# Patient Record
Sex: Female | Born: 1935 | Race: White | Hispanic: No | Smoking: Never smoker
Health system: Southern US, Community
[De-identification: ages and names within clinical notes are randomized; demographics above are authoritative.]

## PROBLEM LIST (undated history)

## (undated) DIAGNOSIS — Z923 Personal history of irradiation: Secondary | ICD-10-CM

## (undated) DIAGNOSIS — F329 Major depressive disorder, single episode, unspecified: Secondary | ICD-10-CM

## (undated) DIAGNOSIS — G4700 Insomnia, unspecified: Secondary | ICD-10-CM

## (undated) DIAGNOSIS — C55 Malignant neoplasm of uterus, part unspecified: Secondary | ICD-10-CM

## (undated) DIAGNOSIS — I10 Essential (primary) hypertension: Secondary | ICD-10-CM

## (undated) DIAGNOSIS — F32A Depression, unspecified: Secondary | ICD-10-CM

## (undated) DIAGNOSIS — I5032 Chronic diastolic (congestive) heart failure: Secondary | ICD-10-CM

## (undated) DIAGNOSIS — K2270 Barrett's esophagus without dysplasia: Secondary | ICD-10-CM

## (undated) DIAGNOSIS — M159 Polyosteoarthritis, unspecified: Secondary | ICD-10-CM

## (undated) DIAGNOSIS — E039 Hypothyroidism, unspecified: Secondary | ICD-10-CM

## (undated) DIAGNOSIS — C3490 Malignant neoplasm of unspecified part of unspecified bronchus or lung: Secondary | ICD-10-CM

## (undated) DIAGNOSIS — I4821 Permanent atrial fibrillation: Secondary | ICD-10-CM

## (undated) DIAGNOSIS — E785 Hyperlipidemia, unspecified: Secondary | ICD-10-CM

## (undated) HISTORY — DX: Permanent atrial fibrillation: I48.21

## (undated) HISTORY — DX: Chronic diastolic (congestive) heart failure: I50.32

## (undated) HISTORY — PX: APPENDECTOMY: SHX54

## (undated) HISTORY — PX: OTHER SURGICAL HISTORY: SHX169

## (undated) HISTORY — PX: BREAST BIOPSY: SHX20

---

## 1979-11-27 DIAGNOSIS — C55 Malignant neoplasm of uterus, part unspecified: Secondary | ICD-10-CM

## 1979-11-27 HISTORY — DX: Malignant neoplasm of uterus, part unspecified: C55

## 1982-11-26 HISTORY — PX: ABDOMINAL HYSTERECTOMY: SHX81

## 1998-11-26 HISTORY — PX: KNEE ARTHROSCOPY: SUR90

## 2004-11-15 ENCOUNTER — Ambulatory Visit: Payer: Self-pay

## 2005-07-10 ENCOUNTER — Ambulatory Visit: Payer: Self-pay | Admitting: Internal Medicine

## 2006-08-01 ENCOUNTER — Ambulatory Visit: Payer: Self-pay | Admitting: Internal Medicine

## 2007-11-13 ENCOUNTER — Ambulatory Visit: Payer: Self-pay | Admitting: Internal Medicine

## 2007-12-03 ENCOUNTER — Ambulatory Visit: Payer: Self-pay | Admitting: Internal Medicine

## 2008-02-17 ENCOUNTER — Ambulatory Visit: Payer: Self-pay | Admitting: Unknown Physician Specialty

## 2008-09-01 ENCOUNTER — Ambulatory Visit: Payer: Self-pay | Admitting: Ophthalmology

## 2008-09-01 ENCOUNTER — Other Ambulatory Visit: Payer: Self-pay

## 2008-09-14 ENCOUNTER — Ambulatory Visit: Payer: Self-pay | Admitting: Ophthalmology

## 2008-10-26 ENCOUNTER — Ambulatory Visit: Payer: Self-pay | Admitting: Ophthalmology

## 2008-11-09 ENCOUNTER — Ambulatory Visit: Payer: Self-pay | Admitting: Ophthalmology

## 2008-12-07 ENCOUNTER — Ambulatory Visit: Payer: Self-pay | Admitting: Internal Medicine

## 2009-06-21 ENCOUNTER — Ambulatory Visit: Payer: Self-pay | Admitting: Unknown Physician Specialty

## 2009-12-22 ENCOUNTER — Ambulatory Visit: Payer: Self-pay | Admitting: Internal Medicine

## 2010-08-24 ENCOUNTER — Ambulatory Visit: Payer: Self-pay | Admitting: Internal Medicine

## 2010-09-07 ENCOUNTER — Ambulatory Visit: Payer: Self-pay | Admitting: Unknown Physician Specialty

## 2010-09-14 ENCOUNTER — Ambulatory Visit: Payer: Self-pay | Admitting: Unknown Physician Specialty

## 2011-03-27 ENCOUNTER — Ambulatory Visit: Payer: Self-pay | Admitting: Internal Medicine

## 2012-05-20 ENCOUNTER — Ambulatory Visit: Payer: Self-pay | Admitting: Internal Medicine

## 2013-09-15 ENCOUNTER — Ambulatory Visit: Payer: Self-pay | Admitting: Internal Medicine

## 2014-07-09 ENCOUNTER — Ambulatory Visit: Payer: Self-pay | Admitting: Unknown Physician Specialty

## 2014-07-14 LAB — PATHOLOGY REPORT

## 2014-09-28 ENCOUNTER — Ambulatory Visit: Payer: Self-pay | Admitting: Internal Medicine

## 2015-05-02 DIAGNOSIS — E039 Hypothyroidism, unspecified: Secondary | ICD-10-CM | POA: Insufficient documentation

## 2015-05-02 DIAGNOSIS — G4700 Insomnia, unspecified: Secondary | ICD-10-CM | POA: Insufficient documentation

## 2015-05-02 DIAGNOSIS — I10 Essential (primary) hypertension: Secondary | ICD-10-CM | POA: Insufficient documentation

## 2015-05-02 DIAGNOSIS — M1990 Unspecified osteoarthritis, unspecified site: Secondary | ICD-10-CM | POA: Insufficient documentation

## 2015-05-02 DIAGNOSIS — M810 Age-related osteoporosis without current pathological fracture: Secondary | ICD-10-CM | POA: Insufficient documentation

## 2015-11-27 DIAGNOSIS — C3490 Malignant neoplasm of unspecified part of unspecified bronchus or lung: Secondary | ICD-10-CM

## 2015-11-27 DIAGNOSIS — Z923 Personal history of irradiation: Secondary | ICD-10-CM

## 2015-11-27 HISTORY — DX: Malignant neoplasm of unspecified part of unspecified bronchus or lung: C34.90

## 2015-11-27 HISTORY — DX: Personal history of irradiation: Z92.3

## 2015-12-01 ENCOUNTER — Inpatient Hospital Stay
Admission: EM | Admit: 2015-12-01 | Discharge: 2015-12-03 | DRG: 152 | Disposition: A | Discharge status: Home / Self Care | Payer: Medicare Other | Attending: Internal Medicine | Admitting: Internal Medicine

## 2015-12-01 ENCOUNTER — Emergency Department: Payer: Medicare Other

## 2015-12-01 ENCOUNTER — Encounter: Payer: Self-pay | Admitting: Internal Medicine

## 2015-12-01 DIAGNOSIS — R55 Syncope and collapse: Secondary | ICD-10-CM | POA: Diagnosis not present

## 2015-12-01 DIAGNOSIS — I739 Peripheral vascular disease, unspecified: Secondary | ICD-10-CM | POA: Diagnosis present

## 2015-12-01 DIAGNOSIS — I10 Essential (primary) hypertension: Secondary | ICD-10-CM | POA: Diagnosis present

## 2015-12-01 DIAGNOSIS — E039 Hypothyroidism, unspecified: Secondary | ICD-10-CM | POA: Diagnosis present

## 2015-12-01 DIAGNOSIS — J040 Acute laryngitis: Secondary | ICD-10-CM | POA: Diagnosis not present

## 2015-12-01 DIAGNOSIS — F329 Major depressive disorder, single episode, unspecified: Secondary | ICD-10-CM | POA: Diagnosis present

## 2015-12-01 DIAGNOSIS — Z7901 Long term (current) use of anticoagulants: Secondary | ICD-10-CM

## 2015-12-01 DIAGNOSIS — J40 Bronchitis, not specified as acute or chronic: Secondary | ICD-10-CM | POA: Diagnosis present

## 2015-12-01 DIAGNOSIS — J189 Pneumonia, unspecified organism: Secondary | ICD-10-CM | POA: Diagnosis present

## 2015-12-01 DIAGNOSIS — R4182 Altered mental status, unspecified: Secondary | ICD-10-CM

## 2015-12-01 DIAGNOSIS — E785 Hyperlipidemia, unspecified: Secondary | ICD-10-CM | POA: Diagnosis present

## 2015-12-01 DIAGNOSIS — E162 Hypoglycemia, unspecified: Secondary | ICD-10-CM | POA: Diagnosis present

## 2015-12-01 DIAGNOSIS — N179 Acute kidney failure, unspecified: Secondary | ICD-10-CM

## 2015-12-01 HISTORY — DX: Essential (primary) hypertension: I10

## 2015-12-01 HISTORY — DX: Hypothyroidism, unspecified: E03.9

## 2015-12-01 HISTORY — DX: Polyosteoarthritis, unspecified: M15.9

## 2015-12-01 HISTORY — DX: Insomnia, unspecified: G47.00

## 2015-12-01 HISTORY — DX: Barrett's esophagus without dysplasia: K22.70

## 2015-12-01 HISTORY — DX: Depression, unspecified: F32.A

## 2015-12-01 HISTORY — DX: Hyperlipidemia, unspecified: E78.5

## 2015-12-01 HISTORY — DX: Major depressive disorder, single episode, unspecified: F32.9

## 2015-12-01 LAB — COMPREHENSIVE METABOLIC PANEL
ALBUMIN: 4.2 g/dL (ref 3.5–5.0)
ALT: 13 U/L — ABNORMAL LOW (ref 14–54)
ANION GAP: 8 (ref 5–15)
AST: 19 U/L (ref 15–41)
Alkaline Phosphatase: 52 U/L (ref 38–126)
BUN: 21 mg/dL — AB (ref 6–20)
CHLORIDE: 102 mmol/L (ref 101–111)
CO2: 27 mmol/L (ref 22–32)
Calcium: 9.3 mg/dL (ref 8.9–10.3)
Creatinine, Ser: 1.11 mg/dL — ABNORMAL HIGH (ref 0.44–1.00)
GFR calc Af Amer: 53 mL/min — ABNORMAL LOW (ref >60)
GFR calc non Af Amer: 45 mL/min — ABNORMAL LOW (ref >60)
GLUCOSE: 129 mg/dL — AB (ref 65–99)
POTASSIUM: 3.9 mmol/L (ref 3.5–5.1)
SODIUM: 137 mmol/L (ref 135–145)
Total Bilirubin: 0.5 mg/dL (ref 0.3–1.2)
Total Protein: 7.9 g/dL (ref 6.5–8.1)

## 2015-12-01 LAB — CBC WITH DIFFERENTIAL/PLATELET
BASOS PCT: 1 %
Basophils Absolute: 0.1 10*3/uL (ref 0–0.1)
EOS PCT: 1 %
Eosinophils Absolute: 0.1 10*3/uL (ref 0–0.7)
HEMATOCRIT: 43.1 % (ref 35.0–47.0)
Hemoglobin: 14.3 g/dL (ref 12.0–16.0)
Lymphocytes Relative: 12 %
Lymphs Abs: 1.7 10*3/uL (ref 1.0–3.6)
MCH: 28.7 pg (ref 26.0–34.0)
MCHC: 33.2 g/dL (ref 32.0–36.0)
MCV: 86.6 fL (ref 80.0–100.0)
MONO ABS: 1.1 10*3/uL — AB (ref 0.2–0.9)
MONOS PCT: 7 %
NEUTROS ABS: 12.1 10*3/uL — AB (ref 1.4–6.5)
Neutrophils Relative %: 79 %
PLATELETS: 211 10*3/uL (ref 150–440)
RBC: 4.97 MIL/uL (ref 3.80–5.20)
RDW: 13.9 % (ref 11.5–14.5)
WBC: 15.1 10*3/uL — ABNORMAL HIGH (ref 3.6–11.0)

## 2015-12-01 LAB — URINALYSIS COMPLETE WITH MICROSCOPIC (ARMC ONLY)
BILIRUBIN URINE: NEGATIVE
GLUCOSE, UA: NEGATIVE mg/dL
KETONES UR: NEGATIVE mg/dL
Leukocytes, UA: NEGATIVE
NITRITE: NEGATIVE
Protein, ur: NEGATIVE mg/dL
Specific Gravity, Urine: 1.008 (ref 1.005–1.030)
pH: 6 (ref 5.0–8.0)

## 2015-12-01 LAB — ETHANOL: Alcohol, Ethyl (B): <5 mg/dL (ref <5)

## 2015-12-01 LAB — TSH: TSH: 2.472 u[IU]/mL (ref 0.350–4.500)

## 2015-12-01 LAB — LACTIC ACID, PLASMA
LACTIC ACID, VENOUS: 1.4 mmol/L (ref 0.5–2.0)
LACTIC ACID, VENOUS: 0.8 mmol/L (ref 0.5–2.0)

## 2015-12-01 LAB — TROPONIN I: Troponin I: <0.03 ng/mL (ref <0.031)

## 2015-12-01 MED ORDER — DEXTROSE 5 % IV SOLN
1.0000 g | Freq: Once | INTRAVENOUS | Status: AC
  Administered 2015-12-01: 1 g via INTRAVENOUS
  Filled 2015-12-01: qty 10

## 2015-12-01 MED ORDER — DEXTROSE 5 % IV SOLN
500.0000 mg | Freq: Once | INTRAVENOUS | Status: AC
  Administered 2015-12-01: 500 mg via INTRAVENOUS
  Filled 2015-12-01: qty 500

## 2015-12-01 MED ORDER — AZITHROMYCIN 500 MG IV SOLR
500.0000 mg | INTRAVENOUS | Status: DC
  Filled 2015-12-01: qty 500

## 2015-12-01 MED ORDER — DEXTROSE 5 % IV SOLN
1.0000 g | INTRAVENOUS | Status: DC
  Administered 2015-12-02: 1 g via INTRAVENOUS
  Filled 2015-12-01 (×2): qty 10

## 2015-12-01 MED ORDER — SODIUM CHLORIDE 0.9 % IV BOLUS (SEPSIS)
1000.0000 mL | Freq: Once | INTRAVENOUS | Status: AC
  Administered 2015-12-01: 1000 mL via INTRAVENOUS

## 2015-12-01 MED ORDER — ACETAMINOPHEN 160 MG/5ML PO SUSP
ORAL | Status: AC
  Filled 2015-12-01: qty 5

## 2015-12-01 MED ORDER — ONDANSETRON HCL 4 MG/2ML IJ SOLN: 4.0000 mg | Freq: Four times a day (QID) | INTRAMUSCULAR | Status: DC | PRN

## 2015-12-01 MED ORDER — HEPARIN SODIUM (PORCINE) 5000 UNIT/ML IJ SOLN
5000.0000 [IU] | Freq: Three times a day (TID) | INTRAMUSCULAR | Status: DC
  Administered 2015-12-01 – 2015-12-02 (×2): 5000 [IU] via SUBCUTANEOUS
  Filled 2015-12-01 (×3): qty 1

## 2015-12-01 MED ORDER — ONDANSETRON HCL 4 MG PO TABS: 4.0000 mg | ORAL_TABLET | Freq: Four times a day (QID) | ORAL | Status: DC | PRN

## 2015-12-01 MED ORDER — GUAIFENESIN ER 600 MG PO TB12
600.0000 mg | ORAL_TABLET | Freq: Two times a day (BID) | ORAL | Status: DC
  Administered 2015-12-01 – 2015-12-03 (×4): 600 mg via ORAL
  Filled 2015-12-01 (×4): qty 1

## 2015-12-01 MED ORDER — ACETAMINOPHEN 325 MG PO TABS
650.0000 mg | ORAL_TABLET | Freq: Four times a day (QID) | ORAL | Status: DC | PRN
  Administered 2015-12-01 – 2015-12-02 (×3): 650 mg via ORAL
  Filled 2015-12-01 (×3): qty 2

## 2015-12-01 MED ORDER — SODIUM CHLORIDE 0.9 % IV SOLN
INTRAVENOUS | Status: DC
  Administered 2015-12-01 – 2015-12-03 (×4): via INTRAVENOUS

## 2015-12-01 MED ORDER — ACETAMINOPHEN 650 MG RE SUPP: 650.0000 mg | Freq: Four times a day (QID) | RECTAL | Status: DC | PRN

## 2015-12-01 NOTE — ED Notes (Addendum)
Pt was visiting on the inpatient unit , a rapid response team was paged to 240, pt  was noted to be in and out of consciousness, pt appearing lethargic, but arouses with verbal stimuli., pt was able to stand wit hx2 assist from the chair to stretcher. Even unlabored respirations noted , staff on 2A reported that Ambien was noted to be in her purse.  CBG 124, vitals BP 133/63, sat 98% , HR 100 , RR 18

## 2015-12-01 NOTE — ED Provider Notes (Signed)
[facility] Emergency Department Provider Note   ____________________________________________  Time seen: Approximately 11:45 AM I have reviewed the triage vital signs and the triage nursing note.  HISTORY  Chief Complaint Altered Mental Status   Historian Limited historian, Patient has altered mental status Niece provided some history  HPI Lauren Hammond is a 82 y.o. female who is the spouse of an admitted patient in the hospital. Nursing staff on his floor report they saw her acting normally at about 45 minutes ago. They came back into the room and found her sitting in the recliner chair slumped over to the side and arousable but very somnolent. A rapid response was called and patient was taught to the emergency room for further evaluation. Niece states she walked in basically when the nurses did and found her and somnolent but arousable. She has not seen her act this way before.  Symptoms are moderate to severe.  There is no report of trauma today. The patient does have bruising on her chin from a fall a week ago.    No past medical history on file.  There are no active problems to display for this patient.   No past surgical history on file.  No current outpatient prescriptions on file.  Allergies Review of patient's allergies indicates not on file.  No family history on file.  Social History Social History  Substance Use Topics  . Smoking status: Not on file  . Smokeless tobacco: Not on file  . Alcohol Use: Not on file    Review of Systems Unable to obtain due to altered mental status Although patient states that she is in no pain, and that she's had croup recently.  ____________________________________________   PHYSICAL EXAM:  VITAL SIGNS: ED Triage Vitals  Enc Vitals Group     BP 12/01/15 1154 133/69 mmHg     Pulse Rate 12/01/15 1154 93     Resp 12/01/15 1154 18     Temp 12/01/15 1154 98.2 F (36.8 C)     Temp Source  12/01/15 1154 Oral     SpO2 12/01/15 1154 96 %     Weight 12/01/15 1154 158 lb (71.668 kg)     Height 12/01/15 1154 '5\' 3"'$  (1.6 m)     Head Cir --      Peak Flow --      Pain Score --      Pain Loc --      Pain Edu? --      Excl. in [place]? --      Constitutional: Sleepy/somnolent but alert to voice and then eyes go right back asleep.  Eyes: Conjunctivae are normal. PERRL. Normal extraocular movements. ENT   Head: Normocephalic.  Old ecchymosis to the chin   Nose: No congestion/rhinnorhea.   Mouth/Throat: Mucous membranes are moist. Hoarse/raspy voice.   Neck: No stridor. Cardiovascular/Chest: Normal rate, regular rhythm.  No murmurs, rubs, or gallops. Respiratory: Normal respiratory effort without tachypnea nor retractions. Breath sounds are clear and equal bilaterally. No wheezes/rales/rhonchi. Gastrointestinal: Soft. No distention, no guarding, no rebound. Nontender.  Genitourinary/rectal:Deferred Musculoskeletal: Nontender with normal range of motion in all extremities. No joint effusions.  No lower extremity tenderness.  No edema. Neurologic: Somnolent but arousable to voice. No slurred speech. Patient able to follow commands. 5 out of 5 strength in 4 extremities. No facial droop. Patient does not answer questions about sensation, but appears to have sensation in 4 extremities. Skin:  Skin is warm, dry and intact. No  rash noted.   ____________________________________________   EKG I, Lisa Roca, MD, the attending physician have personally viewed and interpreted all ECGs.  93 bpm. Sinus rhythm with first-degree AV block. Left axis deviation. Normal ST and T-wave. ____________________________________________  LABS (pertinent positives/negatives)  Comprehensive metabolic panel significant for BUN 21 and creatinine 1.11 Alcohol less than 5 Troponin less than 0.03 White blood count 15.1, hemoglobin is 14.3 and platelet count 211 Lactate 1.4 Blood culture  sent  ____________________________________________  RADIOLOGY All Xrays were viewed by me. Imaging interpreted by Radiologist.  Chest 1 view: Mild bibasilar atelectasis/infiltrate.  CT noncontrast:  IMPRESSION: Prominent ventricles with normal appearing sulci. While this finding may be indicative of atrophy, it is concerning for possibly superimposed normal pressure hydrocephalus.  There is mild periventricular small vessel disease. No intracranial mass, hemorrhage, or acute appearing infarct.  Mastoid air cell disease bilaterally, more on the left than on the right. __________________________________________  PROCEDURES  Procedure(s) performed: None  Critical Care performed: None  ____________________________________________   ED COURSE / ASSESSMENT AND PLAN  CONSULTATIONS: Hospitalist for admission, spoke with Dr. Carlyle Dolly  Pertinent labs & imaging results that were available during my care of the patient were reviewed by me and considered in my medical decision making (see chart for details).   Family patient has had a fairly abrupt change in mental status.  She doesn't have any focal neurologic deficits on exam, however given the abrupt change in mental status, I did send her for an immediate head CT evaluation. She is not a fever, or abnormal vital signs, and I'm not suspicious of sepsis at this point. Ambien was found in her pocketbook, and patient denied taking it to her niece except for last night. To me she says she did take one, but her history is extremely unreliable this point.    I updated the daughter with the workup thus far. Head CT negative for acute abnormalities, but we discussed likely need for MRI and possible neurology evaluation at some point for findings possibly consistent with normal pressure hydrocephalus.  From acute standpoint, white blood cell count is elevated, uncertain etiology, but radiologist commented on mild bibasilar atelectasis  versus infiltrate. Given that she has had some upper respiratory symptoms, I will treat her with Rocephin and azithromycin.Marland Kitchen Urinalysis is still pending.  As patient is not her mental status baseline, she will be admitted observation to the hospitalist team. Urinalysis pending.  Patient / Family / Caregiver informed of clinical course, medical decision-making process, and agree with plan.   ___________________________________________   FINAL CLINICAL IMPRESSION(S) / ED DIAGNOSES   Final diagnoses:  Altered mental status, unspecified altered mental status type       Lisa Roca, MD 12/01/15 [phone]

## 2015-12-01 NOTE — Progress Notes (Signed)
   12/01/15 1200  Clinical Encounter Type  Visited With Patient and family together  Visit Type Follow-up  Referral From Nurse  Consult/Referral To Chaplain  I came to ED to check on patient who had gone unresponsive in husband's room while visiting. Patient is asleep. I spent time with patient's niece.  [place]

## 2015-12-01 NOTE — ED Notes (Signed)
Pt given sandwich tray 

## 2015-12-01 NOTE — H&P (Signed)
[facility] NAME: Lauren Hammond    MR#:  [id number]  DATE OF BIRTH:  Apr 30, 1932  DATE OF ADMISSION:  12/01/2015  PRIMARY CARE PHYSICIAN: Jacklynn Ganong MD REQUESTING/REFERRING PHYSICIAN: Lisa Roca MD  CHIEF COMPLAINT:   Chief Complaint  Patient presents with  . Altered Mental Status    HISTORY OF PRESENT ILLNESS: Lauren Hammond  is a 82 y.o. female with a known history of hypertension, depression and hypothyroidism who is brought to the hospital after she was visiting her husband in the hospital. While in the room patient became lethargic and collapse. She has been intermittently confused but now mental status improved. Patient had evaluation in the ER and noticed to have a WBC count was elevated chest x-ray suggestive of possible infiltrate. She also has been very weak. She did fall 2 weeks ago. Patient also has had a him laryngitis for the past few days. She denies any chest pain or shortness of breath no nausea vomiting or diarrhea. Patient had a CT scan of the head which  showed no CVA.        PAST MEDICAL HISTORY:   Past Medical History  Diagnosis Date  . HTN (hypertension)   . Depression   . Hypothyroid   . Insomnia   . Hyperlipemia   . Generalized OA   . Barrett esophagus     PAST SURGICAL HISTORY: Past Surgical History  Procedure Laterality Date  . Right breast bx    . Appendectomy    . Hysterotomy      SOCIAL HISTORY:  Social History  Substance Use Topics  . Smoking status: Not on file  . Smokeless tobacco: Not on file  . Alcohol Use: No    FAMILY HISTORY:  Family History  Problem Relation Age of Onset  . Hypertension      DRUG ALLERGIES:  Allergies  Allergen Reactions  . Other     Reaction: patient was unable to communicate and family member was unable to tell me any allergies     REVIEW OF SYSTEMS:   CONSTITUTIONAL: No fever, positive fatigue and weakness.  EYES: No blurred or  double vision.  EARS, NOSE, AND THROAT: No tinnitus or ear pain. Positive sore throat RESPIRATORY: Positive cough, positive shortness of breath, positive wheezing no hemoptysis.  CARDIOVASCULAR: No chest pain, orthopnea, edema.  GASTROINTESTINAL: No nausea, vomiting, diarrhea or abdominal pain.  GENITOURINARY: No dysuria, hematuria.  ENDOCRINE: No polyuria, nocturia,  HEMATOLOGY: No anemia, easy bruising or bleeding SKIN: No rash or lesion. MUSCULOSKELETAL: No joint pain or arthritis.   NEUROLOGIC: No tingling, numbness, weakness.  PSYCHIATRY: No anxiety or depression.   MEDICATIONS AT HOME:  Prior to Admission medications   Medication Sig Start Date End Date Taking? Authorizing Provider  amLODipine (NORVASC) 10 MG tablet Take 10 mg by mouth daily.   Yes Historical Provider, MD  lovastatin (MEVACOR) 20 MG tablet Take 20 mg by mouth daily.   Yes Historical Provider, MD  sertraline (ZOLOFT) 100 MG tablet Take 100 mg by mouth daily.   Yes Historical Provider, MD  SYNTHROID 88 MCG tablet Take 88 mcg by mouth daily before breakfast.   Yes Historical Provider, MD      PHYSICAL EXAMINATION:   VITAL SIGNS: Blood pressure 134/69, pulse 81, temperature 98.2 F (36.8 C), temperature source Oral, resp. rate 18, height '5\' 3"'$  (1.6 m), weight 71.668 kg (158 lb), SpO2 96 %.  GENERAL:  82 y.o.-year-old patient lying  in the bed appears very weak EYES: Pupils equal, round, reactive to light and accommodation. No scleral icterus. Extraocular muscles intact.  HEENT: Head atraumatic, normocephalic. Oropharynx with erythema.  NECK:  Supple, no jugular venous distention. No thyroid enlargement, no tenderness.  LUNGS: Normal breath sounds bilaterally, no wheezing, rales,rhonchi or crepitation. No use of accessory muscles of respiration.  CARDIOVASCULAR: S1, S2 normal. No murmurs, rubs, or gallops.  ABDOMEN: Soft, nontender, nondistended. Bowel sounds present. No organomegaly or mass.  EXTREMITIES: No pedal  edema, cyanosis, or clubbing.  NEUROLOGIC: Cranial nerves II through XII are intact. Muscle strength 5/5 in all extremities. Sensation intact. Gait not checked.  PSYCHIATRIC: The patient is alert and oriented x 3.  SKIN: Has bruising in her chin area  LABORATORY PANEL:   CBC  Recent Labs Lab 12/01/15 1149  WBC 15.1*  HGB 14.3  HCT 43.1  PLT 211  MCV 86.6  MCH 28.7  MCHC 33.2  RDW 13.9  LYMPHSABS 1.7  MONOABS 1.1*  EOSABS 0.1  BASOSABS 0.1   ------------------------------------------------------------------------------------------------------------------  Chemistries   Recent Labs Lab 12/01/15 1149  NA 137  K 3.9  CL 102  CO2 27  GLUCOSE 129*  BUN 21*  CREATININE 1.11*  CALCIUM 9.3  AST 19  ALT 13*  ALKPHOS 52  BILITOT 0.5   ------------------------------------------------------------------------------------------------------------------ estimated creatinine clearance is 37.7 mL/min (by C-G formula based on Cr of 1.11). ------------------------------------------------------------------------------------------------------------------ No results for input(s): TSH, T4TOTAL, T3FREE, THYROIDAB in the last 72 hours.  Invalid input(s): FREET3   Coagulation profile No results for input(s): INR, PROTIME in the last 168 hours. ------------------------------------------------------------------------------------------------------------------- No results for input(s): DDIMER in the last 72 hours. -------------------------------------------------------------------------------------------------------------------  Cardiac Enzymes  Recent Labs Lab 12/01/15 1149  TROPONINI <0.03   ------------------------------------------------------------------------------------------------------------------ Invalid input(s): POCBNP  ---------------------------------------------------------------------------------------------------------------  Urinalysis    Component Value  Date/Time   COLORURINE YELLOW* 12/01/2015 1508   APPEARANCEUR CLEAR* 12/01/2015 1508   LABSPEC 1.008 12/01/2015 1508   PHURINE 6.0 12/01/2015 1508   GLUCOSEU NEGATIVE 12/01/2015 1508   HGBUR 1+* 12/01/2015 1508   BILIRUBINUR NEGATIVE 12/01/2015 1508   KETONESUR NEGATIVE 12/01/2015 1508   PROTEINUR NEGATIVE 12/01/2015 1508   NITRITE NEGATIVE 12/01/2015 1508   LEUKOCYTESUR NEGATIVE 12/01/2015 1508     RADIOLOGY: Dg Chest 1 View  12/01/2015  CLINICAL DATA:  Cough EXAM: CHEST 1 VIEW COMPARISON:  None. FINDINGS: Mild bibasilar airspace disease most likely atelectasis. Negative for heart failure or effusion. No mass lesion. IMPRESSION: Mild bibasilar atelectasis/infiltrate. Electronically Signed   By: Franchot Gallo M.D.   On: 12/01/2015 12:41   Ct Head Wo Contrast  12/01/2015  CLINICAL DATA:  Confusion and transient loss of consciousness. Lethargy. EXAM: CT HEAD WITHOUT CONTRAST TECHNIQUE: Contiguous axial images were obtained from the base of the skull through the vertex without intravenous contrast. COMPARISON:  None. FINDINGS: There is mild generalized ventricular enlargement. Sulci appear within normal limits. There is no intracranial mass, hemorrhage, extra-axial fluid collection, or midline shift. There is mild small vessel disease in the centra semiovale bilaterally. Elsewhere gray-white compartments appear normal. There is no acute infarct evident. The bony calvarium appears intact. There is opacification of multiple mastoid air cells bilaterally, somewhat more on the left than on the right. There are no intraorbital lesions appreciable. IMPRESSION: Prominent ventricles with normal appearing sulci. While this finding may be indicative of atrophy, it is concerning for possibly superimposed normal pressure hydrocephalus. There is mild periventricular small vessel disease. No intracranial mass, hemorrhage, or acute appearing infarct. Mastoid  air cell disease bilaterally, more on the left than on  the right. Electronically Signed   By: Lowella Grip III M.D.   On: 12/01/2015 12:23    EKG: Orders placed or performed during the hospital encounter of 12/01/15  . EKG 12-Lead  . EKG 12-Lead    IMPRESSION AND PLAN: Patient is a 82 year old who is being admitted after she collapsed.  1. Syncope: Likely related to him acute laryngitis as well as pneumonia. At this time will treat her with IV fluids. IV antibiotics. Orthostatic vital sign. Echocardiogram of the heart, telemetry monitoring  2. Hypertension  continue amlodipine.  3. Hypothyroidism continue Synthroid  4. Hyperlipidemia continue Mevacor  5. Misc: lovenox for dvt proph    All the records are reviewed and case discussed with ED provider. Management plans discussed with the patient, family and they are in agreement.  CODE STATUS:full    TOTAL TIME TAKING CARE OF THIS PATIENT: 55 minutes.    Dustin Flock M.D on 12/01/2015 at 4:13 PM  Between 7am to 6pm - Pager - [phone]  After 6pm go to [email or web address] - password [email or web address]  [facility]  Office  [phone]  CC: Primary care physician; No primary care provider on file.

## 2015-12-02 ENCOUNTER — Inpatient Hospital Stay
Admit: 2015-12-02 | Discharge: 2015-12-02 | Disposition: A | Discharge status: Home / Self Care | Payer: Medicare Other | Attending: Internal Medicine | Admitting: Internal Medicine

## 2015-12-02 LAB — CBC
HCT: 39.2 % (ref 35.0–47.0)
HEMOGLOBIN: 13 g/dL (ref 12.0–16.0)
MCH: 28.9 pg (ref 26.0–34.0)
MCHC: 33.2 g/dL (ref 32.0–36.0)
MCV: 87 fL (ref 80.0–100.0)
Platelets: 172 10*3/uL (ref 150–440)
RBC: 4.51 MIL/uL (ref 3.80–5.20)
RDW: 13.9 % (ref 11.5–14.5)
WBC: 8.6 10*3/uL (ref 3.6–11.0)

## 2015-12-02 MED ORDER — AZITHROMYCIN 250 MG PO TABS
500.0000 mg | ORAL_TABLET | Freq: Every day | ORAL | Status: DC
  Administered 2015-12-02 – 2015-12-03 (×2): 500 mg via ORAL
  Filled 2015-12-02 (×2): qty 2

## 2015-12-02 MED ORDER — PANTOPRAZOLE SODIUM 40 MG PO TBEC
40.0000 mg | DELAYED_RELEASE_TABLET | Freq: Every day | ORAL | Status: DC
  Administered 2015-12-02 – 2015-12-03 (×2): 40 mg via ORAL
  Filled 2015-12-02 (×2): qty 1

## 2015-12-02 MED ORDER — LEVOTHYROXINE SODIUM 88 MCG PO TABS
88.0000 ug | ORAL_TABLET | Freq: Every day | ORAL | Status: DC
  Administered 2015-12-03: 88 ug via ORAL
  Filled 2015-12-02: qty 1

## 2015-12-02 MED ORDER — ENOXAPARIN SODIUM 40 MG/0.4ML ~~LOC~~ SOLN
40.0000 mg | SUBCUTANEOUS | Status: DC
  Administered 2015-12-02: 40 mg via SUBCUTANEOUS
  Filled 2015-12-02: qty 0.4

## 2015-12-02 MED ORDER — ZOLPIDEM TARTRATE 5 MG PO TABS
5.0000 mg | ORAL_TABLET | Freq: Every evening | ORAL | Status: DC | PRN
  Administered 2015-12-03: 5 mg via ORAL
  Filled 2015-12-02: qty 1

## 2015-12-02 MED ORDER — SERTRALINE HCL 100 MG PO TABS
100.0000 mg | ORAL_TABLET | Freq: Every day | ORAL | Status: DC
  Administered 2015-12-02 – 2015-12-03 (×2): 100 mg via ORAL
  Filled 2015-12-02 (×2): qty 1

## 2015-12-02 MED ORDER — DEXTROSE 5 % IV SOLN
500.0000 mg | INTRAVENOUS | Status: DC
  Filled 2015-12-02: qty 500

## 2015-12-02 MED ORDER — PRAVASTATIN SODIUM 20 MG PO TABS
40.0000 mg | ORAL_TABLET | Freq: Every day | ORAL | Status: DC
  Administered 2015-12-02: 40 mg via ORAL
  Filled 2015-12-02: qty 2

## 2015-12-02 NOTE — Care Management Important Message (Signed)
Important Message  Patient Details  Name: Lauren Hammond MRN: [id number] Date of Birth: [birth year]   Medicare Important Message Given:  Yes    Chanta Bauers A, RN 12/02/2015, 1:15 PM

## 2015-12-02 NOTE — Progress Notes (Signed)
Pt alert and oriented, no complaints of pain.  NSR, lungs clear on room air, no respiratory difficultly.  Pt up in room and to bathroom voiding.  Tolerating diet, bm this shift.  VSS, afebrile.  Echo performed, Dr. Volanda Napoleon had considered discharging pt once ehco results back, Echo results still pending.

## 2015-12-02 NOTE — Evaluation (Signed)
Physical Therapy Evaluation Patient Details Name: Lauren Hammond MRN: [id number] DOB: Dec 18, 1931 Today's Date: 12/02/2015   History of Present Illness  Lauren Hammond is a 49 .o. female with a known history of hypertension, depression and hypothyroidism who is brought to the hospital after she was visiting her husband in the hospital. While in the room patient became lethargic and collapse. She has been intermittently confused but now mental status improved. Patient had evaluation in the ER and noticed to have a WBC count was elevated chest x-ray suggestive of possible infiltrate. She also has been very weak. She did fall 2 weeks ago. Patient also has had a him laryngitis for the past few days. She denies any chest pain or shortness of breath no nausea vomiting or diarrhea. Patient had a CT scan of the head which showed no CVA. 3 falls in the last 12 months.   Clinical Impression  Pt demonstrates good strength and stability with bed mobility and transfers. She is able to progress her ambulation to no assistive device but does demonstrates some mild lateral gait deviations and slowing of speed with head turns. Increased lateral sway with Rhomberg testing and single leg balance 2-3 seconds on each leg (best of 3 trials). Pt with 3 falls over the last 12 months. Pt would benefit from OP PT for balance training and general strengthening to reduce fall risk and improve function at home. Pt is concerned about her husband currently who is also admitted to the hospital. Pt will benefit from skilled PT services to address deficits in strength, balance, and mobility in order to return to full function at home.     Follow Up Recommendations Outpatient PT (For balance and general strengthening)    Equipment Recommendations   (Encouraged to use single point cane for ambulation)    Recommendations for Other Services       Precautions / Restrictions Precautions Precautions: Fall Restrictions Weight Bearing  Restrictions: No      Mobility  Bed Mobility Overal bed mobility: Needs Assistance Bed Mobility: Supine to Sit     Supine to sit: Supervision     General bed mobility comments: Pt demonstrates good strength and sequencing with bed mobility  Transfers Overall transfer level: Needs assistance Equipment used: Rolling walker (2 wheeled) Transfers: Sit to/from Stand Sit to Stand: Min guard         General transfer comment: Pt demonstrates good safety awareness, stability, and strength with transfers. Safe hand placement noted  Ambulation/Gait Ambulation/Gait assistance: Min guard;Supervision Ambulation Distance (Feet): 220 Feet Assistive device: Rolling walker (2 wheeled) (Progressing to no assistive device) Gait Pattern/deviations: WFL(Within Functional Limits)   Gait velocity interpretation: at or above normal speed for age/gender General Gait Details: Pt demonstrates excellent gait speed and step length. Good scanning of visual environment. Able to perform head turns horizontal and vertical with minimal lateral gait deviations and some slowing of speed. No overt LOB without assistive device but some decreased speed. Quick turns in <3 seconds. Pt able to ambulate half of distance without walker and no overt LOB  Stairs            Wheelchair Mobility    Modified Rankin (Stroke Patients Only)       Balance Overall balance assessment: Needs assistance;History of Falls   Sitting balance-Leahy Scale: Good       Standing balance-Leahy Scale: Fair Standing balance comment: Single leg balance 2-3 seconds on each leg. Positive Rhomberg for increased sway but no overt LOB  Pertinent Vitals/Pain Pain Assessment: No/denies pain    Home Living Family/patient expects to be discharged to:: Private residence Living Arrangements: Spouse/significant other Available Help at Discharge: Family Type of Home: House Home Access: Level  entry     Home Layout: One level Home Equipment: Walker - standard;Shower seat;Grab bars - tub/shower;Grab bars - toilet (regular walker)      Prior Function Level of Independence: Needs assistance         Comments: Assist from husband with IADLs. Full community ambulator without assistive device     Hand Dominance   Dominant Hand: Right    Extremity/Trunk Assessment   Upper Extremity Assessment: Overall WFL for tasks assessed           Lower Extremity Assessment: Overall WFL for tasks assessed         Communication   Communication: No difficulties  Cognition Arousal/Alertness: Awake/alert Behavior During Therapy: WFL for tasks assessed/performed Overall Cognitive Status: Within Functional Limits for tasks assessed                      General Comments      Exercises        Assessment/Plan    PT Assessment Patient needs continued PT services  PT Diagnosis Abnormality of gait   PT Problem List Decreased balance;Decreased strength;Decreased mobility;Decreased knowledge of use of DME  PT Treatment Interventions DME instruction;Gait training;Functional mobility training;Therapeutic activities;Therapeutic exercise;Balance training;Neuromuscular re-education;Patient/family education   PT Goals (Current goals can be found in the Care Plan section) Acute Rehab PT Goals Patient Stated Goal: Return to prior level of function at home PT Goal Formulation: With patient Time For Goal Achievement: 12/16/15 Potential to Achieve Goals: Good    Frequency Min 2X/week   Barriers to discharge Decreased caregiver support Husband currently admitted to hospital as well    Co-evaluation               End of Session Equipment Utilized During Treatment: Gait belt Activity Tolerance: Patient tolerated treatment well Patient left: in chair;with call bell/phone within reach;with chair alarm set;with SCD's reapplied Nurse Communication: Mobility status (Desire  for clarification regarding plan of care)         Time: 5364-6803 PT Time Calculation (min) (ACUTE ONLY): 35 min   Charges:   PT Evaluation $PT Eval Moderate Complexity: 1 Procedure PT Treatments $Gait Training: 8-22 mins   PT G Codes:       Lauren Hammond PT, DPT   Lauren Hammond 12/02/2015, 10:25 AM

## 2015-12-02 NOTE — Progress Notes (Signed)
*  PRELIMINARY RESULTS* Echocardiogram 2D Echocardiogram has been performed.  Laqueta Jean Hege 12/02/2015, 1:57 PM

## 2015-12-03 MED ORDER — GUAIFENESIN ER 600 MG PO TB12: 600.0000 mg | ORAL_TABLET | Freq: Two times a day (BID) | ORAL | Status: DC

## 2015-12-03 MED ORDER — CEFUROXIME AXETIL 250 MG PO TABS: 250.0000 mg | ORAL_TABLET | Freq: Two times a day (BID) | ORAL | Status: DC

## 2015-12-03 MED ORDER — AZITHROMYCIN 250 MG PO TABS: 250.0000 mg | ORAL_TABLET | Freq: Every day | ORAL | Status: DC

## 2015-12-03 MED ORDER — AMLODIPINE BESYLATE 10 MG PO TABS: 5.0000 mg | ORAL_TABLET | Freq: Every day | ORAL | Status: DC

## 2015-12-03 NOTE — Discharge Summary (Signed)
[place] at [place] NAME: Lauren Hammond    MR#:  [id number]  DATE OF BIRTH:  [birth year]  DATE OF ADMISSION:  12/01/2015 ADMITTING PHYSICIAN: Dustin Flock, MD  DATE OF DISCHARGE: 12/03/2015  PRIMARY CARE PHYSICIAN: No primary care provider on file.    ADMISSION DIAGNOSIS:  Acute renal failure, unspecified acute renal failure type ([place]) [N17.9] Altered mental status, unspecified altered mental status type [R41.82]  DISCHARGE DIAGNOSIS:  Active Problems:   Pneumonia   Syncopal episode.  SECONDARY DIAGNOSIS:   Past Medical History  Diagnosis Date  . HTN (hypertension)   . Depression   . Hypothyroid   . Insomnia   . Hyperlipemia   . Generalized OA   . Barrett esophagus     HOSPITAL COURSE:   Patient is a 82 year old who is being admitted after she collapsed.  1. Syncope:  rlated to him acute laryngitis andpneumonia.  At this time will treat her with IV fluids. IV antibiotics.  Orthostatic vital sign stable Echocardiogram of the heart, telemetry monitoring done- no clear reason for syncope. Swich to oral Abx.  PT suggested to follow in PT clinic.  CT head shows some dilation of ventricles- advised to follow with neurologist in office.  2. Hypertension continue amlodipine.   BP stable, checked orthostatic vitals.  3. Hypothyroidism continue Synthroid  4. Hyperlipidemia continue Mevacor  5. Misc: lovenox for dvt proph  DISCHARGE CONDITIONS:   Stable.  CONSULTS OBTAINED:  Treatment Team:  Dustin Flock, MD  DRUG ALLERGIES:   Allergies  Allergen Reactions  . Other     Reaction: patient was unable to communicate and family member was unable to tell me any allergies     DISCHARGE MEDICATIONS:   Current Discharge Medication List    START taking these medications   Details  azithromycin (ZITHROMAX) 250 MG tablet Take 1 tablet (250 mg total) by mouth daily. Qty: 4 each, Refills: 0    cefUROXime  (CEFTIN) 250 MG tablet Take 1 tablet (250 mg total) by mouth 2 (two) times daily with a meal. Qty: 8 tablet, Refills: 0    guaiFENesin (MUCINEX) 600 MG 12 hr tablet Take 1 tablet (600 mg total) by mouth 2 (two) times daily. Qty: 10 tablet, Refills: 0      CONTINUE these medications which have CHANGED   Details  amLODipine (NORVASC) 10 MG tablet Take 0.5 tablets (5 mg total) by mouth daily. Qty: 30 tablet, Refills: 0      CONTINUE these medications which have NOT CHANGED   Details  lovastatin (MEVACOR) 20 MG tablet Take 20 mg by mouth daily.    omeprazole (PRILOSEC) 20 MG capsule Take 40 mg by mouth 2 (two) times daily before a meal.    sertraline (ZOLOFT) 100 MG tablet Take 100 mg by mouth daily.    SYNTHROID 88 MCG tablet Take 88 mcg by mouth daily before breakfast.    zolpidem (AMBIEN) 5 MG tablet Take 5 mg by mouth at bedtime as needed for sleep.         DISCHARGE INSTRUCTIONS:    Follow with PMD & neurologist in 2 weeks.  If you experience worsening of your admission symptoms, develop shortness of breath, life threatening emergency, suicidal or homicidal thoughts you must seek medical attention immediately by calling 911 or calling your MD immediately  if symptoms less severe.  You Must read complete instructions/literature along with all the possible adverse reactions/side effects for all the Medicines you  take and that have been prescribed to you. Take any new Medicines after you have completely understood and accept all the possible adverse reactions/side effects.   Please note  You were cared for by a hospitalist during your hospital stay. If you have any questions about your discharge medications or the care you received while you were in the hospital after you are discharged, you can call the unit and asked to speak with the hospitalist on call if the hospitalist that took care of you is not available. Once you are discharged, your primary care physician will handle  any further medical issues. Please note that NO REFILLS for any discharge medications will be authorized once you are discharged, as it is imperative that you return to your primary care physician (or establish a relationship with a primary care physician if you do not have one) for your aftercare needs so that they can reassess your need for medications and monitor your lab values.    Today   CHIEF COMPLAINT:   Chief Complaint  Patient presents with  . Altered Mental Status    HISTORY OF PRESENT ILLNESS:  Lauren Hammond  is a 82 y.o. female with a known history of hypertension, depression and hypothyroidism who is brought to the hospital after she was visiting her husband in the hospital. While in the room patient became lethargic and collapse. She has been intermittently confused but now mental status improved. Patient had evaluation in the ER and noticed to have a WBC count was elevated chest x-ray suggestive of possible infiltrate. She also has been very weak. She did fall 2 weeks ago. Patient also has had a him laryngitis for the past few days. She denies any chest pain or shortness of breath no nausea vomiting or diarrhea. Patient had a CT scan of the head which showed no CVA.   VITAL SIGNS:  Blood pressure 136/65, pulse 73, temperature 98 F (36.7 C), temperature source Oral, resp. rate 18, height '5\' 3"'$  (1.6 m), weight 71.668 kg (158 lb), SpO2 94 %.  I/O:   Intake/Output Summary (Last 24 hours) at 12/03/15 1409 Last data filed at 12/03/15 0900  Gross per 24 hour  Intake   1680 ml  Output      0 ml  Net   1680 ml    PHYSICAL EXAMINATION:   GENERAL: 82 y.o.-year-old patient lying in the bed appears very weak EYES: Pupils equal, round, reactive to light and accommodation. No scleral icterus. Extraocular muscles intact.  HEENT: some healing broise on left side of head, conjunctiva pink, no icterus NECK: Supple, no jugular venous distention. No thyroid enlargement, no  tenderness.  LUNGS: Normal breath sounds bilaterally, no wheezing, rales,rhonchi or crepitation. No use of accessory muscles of respiration.  CARDIOVASCULAR: S1, S2 normal. No murmurs, rubs, or gallops.  ABDOMEN: Soft, nontender, nondistended. Bowel sounds present. No organomegaly or mass.  EXTREMITIES: No pedal edema, cyanosis, or clubbing.  NEUROLOGIC: Cranial nerves II through XII are intact. Muscle strength 5/5 in all extremities. Sensation intact. Gait not checked.  PSYCHIATRIC: The patient is alert and oriented x 3.  SKIN: Has bruising in her chin area   DATA REVIEW:   CBC  Recent Labs Lab 12/02/15 0526  WBC 8.6  HGB 13.0  HCT 39.2  PLT 172    Chemistries   Recent Labs Lab 12/01/15 1149  NA 137  K 3.9  CL 102  CO2 27  GLUCOSE 129*  BUN 21*  CREATININE 1.11*  CALCIUM 9.3  AST 19  ALT 13*  ALKPHOS 52  BILITOT 0.5    Cardiac Enzymes  Recent Labs Lab 12/01/15 1149  TROPONINI <0.03    Microbiology Results  Results for orders placed or performed during the hospital encounter of 12/01/15  Culture, blood (routine x 2)     Status: None (Preliminary result)   Collection Time: 12/01/15  3:07 PM  Result Value Ref Range Status   Specimen Description BLOOD LEFT ASSIST CONTROL  Final   Special Requests   Final    BOTTLES DRAWN AEROBIC AND ANAEROBIC 6CC AERO [place] ANA   Culture NO GROWTH 2 DAYS  Final   Report Status PENDING  Incomplete  Culture, blood (routine x 2)     Status: None (Preliminary result)   Collection Time: 12/01/15  3:30 PM  Result Value Ref Range Status   Specimen Description BLOOD RIGHT ASSIST CONTROL  Final   Special Requests   Final    BOTTLES DRAWN AEROBIC AND ANAEROBIC 8CC AERO 6CC ANA   Culture NO GROWTH 2 DAYS  Final   Report Status PENDING  Incomplete    RADIOLOGY:  No results found.    Management plans discussed with the patient, family and they are in agreement.  CODE STATUS:     Code Status Orders        Start      Ordered   12/01/15 1806  Full code   Continuous     12/01/15 1806    Advance Directive Documentation        Most Recent Value   Type of Advance Directive  Healthcare Power of Attorney, Living will   Pre-existing out of facility DNR order (yellow form or pink MOST form)     "MOST" Form in Place?        TOTAL TIME TAKING CARE OF THIS PATIENT: 35 minutes,   Vaughan Basta M.D on 12/03/2015 at 2:09 PM  Between 7am to 6pm - Pager - [phone]  After 6pm go to [email or web address] - password [email or web address] [facility]  [place] [facility]  Office  [phone]  CC: Primary care physician; No primary care provider on file.   Note: This dictation was prepared with Dragon dictation along with smaller phrase technology. Any transcriptional errors that result from this process are unintentional.

## 2015-12-03 NOTE — Progress Notes (Addendum)
[place] at [facility] (late entry)   PATIENT NAME: Lauren Hammond    MR#:  [id number]  DATE OF BIRTH:  March 22, 1932  SUBJECTIVE:  CHIEF COMPLAINT:   Chief Complaint  Patient presents with  . Altered Mental Status   Doing well this afternoon. No further syncope or dizziness. States that she has had a cough for several days and has been tired taking care of her husband (also admitted). Did not eat before syncopal episode.  REVIEW OF SYSTEMS:   ROS   No chest pain, shortness of breath, fevers or chills. No focal neurologic symptoms. Dry cough.  DRUG ALLERGIES:   Allergies  Allergen Reactions  . Other     Reaction: patient was unable to communicate and family member was unable to tell me any allergies     VITALS:  Blood pressure 136/65, pulse 73, temperature 98 F (36.7 C), temperature source Oral, resp. rate 18, height '5\' 3"'$  (1.6 m), weight 71.668 kg (158 lb), SpO2 94 %.  PHYSICAL EXAMINATION:  GENERAL:  82 y.o.-year-old patient lying in the bed with no acute distress.  EYES: Pupils equal, round, reactive to light and accommodation. No scleral icterus. Extraocular muscles intact.  HEENT: Head atraumatic, normocephalic. Oropharynx and nasopharynx clear.  NECK:  Supple, no jugular venous distention. No thyroid enlargement, no tenderness.  LUNGS: Normal breath sounds bilaterally, no wheezing, rales,rhonchi or crepitation. No use of accessory muscles of respiration. No cough on exam CARDIOVASCULAR: S1, S2 normal. No murmurs, rubs, or gallops.  ABDOMEN: Soft, nontender, nondistended. Bowel sounds present. No organomegaly or mass.  EXTREMITIES: No pedal edema, cyanosis, or clubbing.  NEUROLOGIC: Cranial nerves II through XII are intact. Muscle strength 5/5 in all extremities. Sensation intact. Gait not checked.  PSYCHIATRIC: The patient is alert and oriented x 3.  SKIN: No obvious rash, lesion, or ulcer.    LABORATORY PANEL:   CBC  Recent  Labs Lab 12/02/15 0526  WBC 8.6  HGB 13.0  HCT 39.2  PLT 172   ------------------------------------------------------------------------------------------------------------------  Chemistries   Recent Labs Lab 12/01/15 1149  NA 137  K 3.9  CL 102  CO2 27  GLUCOSE 129*  BUN 21*  CREATININE 1.11*  CALCIUM 9.3  AST 19  ALT 13*  ALKPHOS 52  BILITOT 0.5   ------------------------------------------------------------------------------------------------------------------  Cardiac Enzymes  Recent Labs Lab 12/01/15 1149  TROPONINI <0.03   ------------------------------------------------------------------------------------------------------------------  RADIOLOGY:  No results found.  EKG:   Orders placed or performed during the hospital encounter of 12/01/15  . EKG 12-Lead  . EKG 12-Lead    ASSESSMENT AND PLAN:   1. Syncope - no clear etiology, EKG normal, no events on tele, cardiac enzymes negative - ECHO pending - CT with non specific findings probably atrophy - asymptomatic at this time - likely due to fatigue, hypoglycemia and bronchitis/pneumonia  2. Bronchitis/pneumonia - continue IV fluids and antibiotics at this time  3. Hypertension: controlled. continue amlodipine.  4. Hypothyroidism continue Synthroid  5. Hyperlipidemia continue Mevacor  6. Misc: lovenox for dvt proph      All the records are reviewed and case discussed with Care Management/Social Workerr. Management plans discussed with the patient, family and they are in agreement.  CODE STATUS: full  TOTAL TIME TAKING CARE OF THIS PATIENT: 20 minutes.  Greater than 50% of time spent in care coordination and counseling. POSSIBLE D/C IN 1 DAYS, DEPENDING ON CLINICAL CONDITION.   Myrtis Ser M.D on 12/03/2015 at 9:27 PM  Between 7am  to 6pm - Pager - [phone]  After 6pm go to [email or web address] - password [email or web address] [facility]  [place] [facility]  Office   [phone]  CC: Primary care physician; No primary care provider on file.

## 2015-12-06 LAB — CULTURE, BLOOD (ROUTINE X 2)
CULTURE: NO GROWTH
CULTURE: NO GROWTH

## 2015-12-14 ENCOUNTER — Other Ambulatory Visit: Payer: Self-pay | Admitting: Internal Medicine

## 2015-12-14 DIAGNOSIS — Z1231 Encounter for screening mammogram for malignant neoplasm of breast: Secondary | ICD-10-CM

## 2015-12-23 ENCOUNTER — Ambulatory Visit
Admission: RE | Admit: 2015-12-23 | Discharge: 2015-12-23 | Disposition: A | Discharge status: Home / Self Care | Payer: Medicare Other | Source: Ambulatory Visit | Attending: Internal Medicine | Admitting: Internal Medicine

## 2015-12-23 DIAGNOSIS — Z1231 Encounter for screening mammogram for malignant neoplasm of breast: Secondary | ICD-10-CM

## 2015-12-27 ENCOUNTER — Other Ambulatory Visit: Payer: Self-pay | Admitting: Internal Medicine

## 2015-12-27 DIAGNOSIS — R928 Other abnormal and inconclusive findings on diagnostic imaging of breast: Secondary | ICD-10-CM

## 2015-12-31 ENCOUNTER — Emergency Department: Payer: Medicare Other

## 2015-12-31 ENCOUNTER — Encounter: Payer: Self-pay | Admitting: Emergency Medicine

## 2015-12-31 ENCOUNTER — Emergency Department
Admission: EM | Admit: 2015-12-31 | Discharge: 2015-12-31 | Disposition: A | Discharge status: Home / Self Care | Payer: Medicare Other | Attending: Emergency Medicine | Admitting: Emergency Medicine

## 2015-12-31 DIAGNOSIS — R0602 Shortness of breath: Secondary | ICD-10-CM | POA: Diagnosis present

## 2015-12-31 DIAGNOSIS — E039 Hypothyroidism, unspecified: Secondary | ICD-10-CM | POA: Diagnosis not present

## 2015-12-31 DIAGNOSIS — Z79899 Other long term (current) drug therapy: Secondary | ICD-10-CM | POA: Insufficient documentation

## 2015-12-31 DIAGNOSIS — J069 Acute upper respiratory infection, unspecified: Secondary | ICD-10-CM | POA: Insufficient documentation

## 2015-12-31 DIAGNOSIS — Z792 Long term (current) use of antibiotics: Secondary | ICD-10-CM | POA: Insufficient documentation

## 2015-12-31 DIAGNOSIS — E785 Hyperlipidemia, unspecified: Secondary | ICD-10-CM | POA: Insufficient documentation

## 2015-12-31 DIAGNOSIS — I10 Essential (primary) hypertension: Secondary | ICD-10-CM | POA: Insufficient documentation

## 2015-12-31 DIAGNOSIS — R911 Solitary pulmonary nodule: Secondary | ICD-10-CM | POA: Insufficient documentation

## 2015-12-31 LAB — BASIC METABOLIC PANEL
ANION GAP: 11 (ref 5–15)
BUN: 14 mg/dL (ref 6–20)
CALCIUM: 8.8 mg/dL — AB (ref 8.9–10.3)
CO2: 23 mmol/L (ref 22–32)
Chloride: 102 mmol/L (ref 101–111)
Creatinine, Ser: 0.86 mg/dL (ref 0.44–1.00)
Glucose, Bld: 108 mg/dL — ABNORMAL HIGH (ref 65–99)
Potassium: 3.5 mmol/L (ref 3.5–5.1)
SODIUM: 136 mmol/L (ref 135–145)

## 2015-12-31 LAB — CBC WITH DIFFERENTIAL/PLATELET
BASOS ABS: 0.1 10*3/uL (ref 0–0.1)
BASOS PCT: 1 %
EOS ABS: 0.1 10*3/uL (ref 0–0.7)
Eosinophils Relative: 1 %
HEMATOCRIT: 46.3 % (ref 35.0–47.0)
HEMOGLOBIN: 15.3 g/dL (ref 12.0–16.0)
Lymphocytes Relative: 13 %
Lymphs Abs: 1.2 10*3/uL (ref 1.0–3.6)
MCH: 28.7 pg (ref 26.0–34.0)
MCHC: 33 g/dL (ref 32.0–36.0)
MCV: 87 fL (ref 80.0–100.0)
Monocytes Absolute: 1 10*3/uL — ABNORMAL HIGH (ref 0.2–0.9)
Monocytes Relative: 11 %
NEUTROS ABS: 6.9 10*3/uL — AB (ref 1.4–6.5)
NEUTROS PCT: 74 %
Platelets: 175 10*3/uL (ref 150–440)
RBC: 5.32 MIL/uL — AB (ref 3.80–5.20)
RDW: 14.7 % — ABNORMAL HIGH (ref 11.5–14.5)
WBC: 9.2 10*3/uL (ref 3.6–11.0)

## 2015-12-31 LAB — POCT RAPID STREP A: STREPTOCOCCUS, GROUP A SCREEN (DIRECT): NEGATIVE

## 2015-12-31 MED ORDER — IOHEXOL 300 MG/ML  SOLN
75.0000 mL | Freq: Once | INTRAMUSCULAR | Status: AC | PRN
  Administered 2015-12-31: 75 mL via INTRAVENOUS
  Filled 2015-12-31: qty 75

## 2015-12-31 MED ORDER — HYDROCOD POLST-CPM POLST ER 10-8 MG/5ML PO SUER
5.0000 mL | Freq: Once | ORAL | Status: AC
  Administered 2015-12-31: 5 mL via ORAL
  Filled 2015-12-31: qty 5

## 2015-12-31 MED ORDER — HYDROCOD POLST-CPM POLST ER 10-8 MG/5ML PO SUER: 5.0000 mL | Freq: Two times a day (BID) | ORAL | Status: DC

## 2015-12-31 NOTE — ED Notes (Signed)
Patient transported to CT 

## 2015-12-31 NOTE — ED Provider Notes (Signed)
[facility] Emergency Department Provider Note  ____________________________________________  Time seen: Approximately 12:16 PM  I have reviewed the triage vital signs and the nursing notes.   HISTORY  Chief Complaint Shortness of Breath    HPI Lauren Hammond is a 82 y.o. female patient complain of shortness of breath, cough, and intermitting nasal congestion runny nose.. Patient received diagnosed and treated for pneumonia for the second week of January. Patient states he feel worse today and she did when she had the pneumonia. Patient denies any chest pain associated this complaint. Patient states her stomach hurts from the prolonged coughing. Patient denies any fever or chills. Patient denies any nausea vomiting diarrhea. Patient states she does have a sore throat. No palliative measures taken for this complaint. Patient rating the pain as a 7/10. Patient describes the pain is achy.   Past Medical History  Diagnosis Date  . HTN (hypertension)   . Depression   . Hypothyroid   . Insomnia   . Hyperlipemia   . Generalized OA   . Barrett esophagus   . Cancer [facility])     uterus ca    Patient Active Problem List   Diagnosis Date Noted  . Pneumonia 12/01/2015    Past Surgical History  Procedure Laterality Date  . Right breast bx    . Appendectomy    . Hysterotomy    . Breast biopsy Right     Current Outpatient Rx  Name  Route  Sig  Dispense  Refill  . amLODipine (NORVASC) 10 MG tablet   Oral   Take 0.5 tablets (5 mg total) by mouth daily.   30 tablet   0   . azithromycin (ZITHROMAX) 250 MG tablet   Oral   Take 1 tablet (250 mg total) by mouth daily.   4 each   0   . cefUROXime (CEFTIN) 250 MG tablet   Oral   Take 1 tablet (250 mg total) by mouth 2 (two) times daily with a meal.   8 tablet   0   . chlorpheniramine-HYDROcodone (TUSSIONEX PENNKINETIC ER) 10-8 MG/5ML SUER   Oral   Take 5 mLs by mouth 2 (two) times daily.   115 mL   0    . guaiFENesin (MUCINEX) 600 MG 12 hr tablet   Oral   Take 1 tablet (600 mg total) by mouth 2 (two) times daily.   10 tablet   0   . lovastatin (MEVACOR) 20 MG tablet   Oral   Take 20 mg by mouth daily.         Marland Kitchen omeprazole (PRILOSEC) 20 MG capsule   Oral   Take 40 mg by mouth 2 (two) times daily before a meal.         . sertraline (ZOLOFT) 100 MG tablet   Oral   Take 100 mg by mouth daily.         Marland Kitchen SYNTHROID 88 MCG tablet   Oral   Take 88 mcg by mouth daily before breakfast.           Dispense as written.   . zolpidem (AMBIEN) 5 MG tablet   Oral   Take 5 mg by mouth at bedtime as needed for sleep.           Allergies Other and Percodan  Family History  Problem Relation Age of Onset  . Hypertension    . Breast cancer Maternal Aunt   . Breast cancer Sister  Social History Social History  Substance Use Topics  . Smoking status: Never Smoker   . Smokeless tobacco: None  . Alcohol Use: No    Review of Systems Constitutional: No fever/chills Eyes: No visual changes. ENT: Sore throat  Cardiovascular: Denies chest pain. Respiratory: States shortness of breath Gastrointestinal: No abdominal pain.  No nausea, no vomiting.  No diarrhea.  No constipation. Genitourinary: Negative for dysuria. Musculoskeletal: Negative for back pain. Skin: Negative for rash. Neurological: Negative for headaches, focal weakness or numbness. Endocrine:Hyperlipidemia and hypothyroidism Hematological/Lymphatic: ____________________________________________   PHYSICAL EXAM:  VITAL SIGNS: ED Triage Vitals  Enc Vitals Group     BP 12/31/15 1145 145/72 mmHg     Pulse Rate 12/31/15 1145 93     Resp 12/31/15 1145 20     Temp 12/31/15 1145 97.5 F (36.4 C)     Temp src --      SpO2 12/31/15 1145 99 %     Weight 12/31/15 1145 148 lb (67.132 kg)     Height 12/31/15 1145 '5\' 6"'$  (1.676 m)     Head Cir --      Peak Flow --      Pain Score 12/31/15 1145 7     Pain Loc  --      Pain Edu? --      Excl. in [place]? --     Constitutional: Alert and oriented. Well appearing and in no acute distress. Eyes: Conjunctivae are normal. PERRL. EOMI. Head: Atraumatic. Nose: No congestion/rhinnorhea. Mouth/Throat: Mucous membranes are moist.  Oropharynx non-erythematous. Neck: No stridor.  No cervical spine tenderness to palpation. Cardiovascular: Normal rate, regular rhythm. Grossly normal heart sounds.  Good peripheral circulation. Respiratory: Normal respiratory effort.  No retractions. Lungs CTAB. Nonproductive cough Gastrointestinal: Soft and nontender. No distention. No abdominal bruits. No CVA tenderness. Musculoskeletal: No lower extremity tenderness nor edema.  No joint effusions. Neurologic:  Normal speech and language. No gross focal neurologic deficits are appreciated. No gait instability. Skin:  Skin is warm, dry and intact. No rash noted. Psychiatric: Mood and affect are normal. Speech and behavior are normal.  ____________________________________________   LABS (all labs ordered are listed, but only abnormal results are displayed)  Labs Reviewed  BASIC METABOLIC PANEL - Abnormal; Notable for the following:    Glucose, Bld 108 (*)    Calcium 8.8 (*)    All other components within normal limits  CBC WITH DIFFERENTIAL/PLATELET - Abnormal; Notable for the following:    RBC 5.32 (*)    RDW 14.7 (*)    Neutro Abs 6.9 (*)    Monocytes Absolute 1.0 (*)    All other components within normal limits  POCT RAPID STREP A   ____________________________________________  EKG  ____________________________________________  RADIOLOGY  X-rays CT scan of the chest shows a 1.8 mm nodule lesion left upper lung. ____________________________________________   PROCEDURES  Procedure(s) performed: None  Critical Care performed: No  ____________________________________________   INITIAL IMPRESSION / ASSESSMENT AND PLAN / ED COURSE  Pertinent labs &  imaging results that were available during my care of the patient were reviewed by me and considered in my medical decision making (see chart for details).  Upper rest or infection. X-ray and CT scan the chest reveal 1.8 cm nodule lesion requiring further evaluation. Advised patient to follow-up with her family doctor in the next 3-5 days. Patient given discharge instructions for upper rest or infection and a prescription for Tussionex for cough. FINAL CLINICAL IMPRESSION(S) / ED DIAGNOSES  Final  diagnoses:  Upper respiratory infection      Sable Feil, PA-C 12/31/15 [place], MD 12/31/15 [phone]

## 2015-12-31 NOTE — ED Notes (Signed)
Returned from [employer]. Warm blanket given

## 2015-12-31 NOTE — ED Notes (Signed)
Daughter since with sinus infection. Son in law has flu but she states she hasn't been around him.

## 2015-12-31 NOTE — ED Notes (Signed)
Patient presents to the ED with shortness of breath, cough and congestion.  Patient states she was diagnosed with pneumonia in the first of January and now she is feeling worse.

## 2015-12-31 NOTE — Discharge Instructions (Signed)

## 2015-12-31 NOTE — ED Notes (Signed)
Previous pneumonia in January 5th. Started with cough, sore throat on 12/28/15

## 2016-01-11 ENCOUNTER — Ambulatory Visit
Admission: RE | Admit: 2016-01-11 | Discharge: 2016-01-11 | Disposition: A | Discharge status: Home / Self Care | Payer: Medicare Other | Source: Ambulatory Visit | Attending: Internal Medicine | Admitting: Internal Medicine

## 2016-01-11 DIAGNOSIS — R928 Other abnormal and inconclusive findings on diagnostic imaging of breast: Secondary | ICD-10-CM | POA: Diagnosis present

## 2016-02-06 DIAGNOSIS — R55 Syncope and collapse: Secondary | ICD-10-CM | POA: Insufficient documentation

## 2016-02-06 DIAGNOSIS — R2681 Unsteadiness on feet: Secondary | ICD-10-CM | POA: Insufficient documentation

## 2016-03-21 ENCOUNTER — Other Ambulatory Visit: Payer: Self-pay | Admitting: Internal Medicine

## 2016-03-22 ENCOUNTER — Other Ambulatory Visit: Payer: Self-pay | Admitting: Internal Medicine

## 2016-03-22 DIAGNOSIS — R911 Solitary pulmonary nodule: Secondary | ICD-10-CM

## 2016-03-27 ENCOUNTER — Ambulatory Visit
Admission: RE | Admit: 2016-03-27 | Discharge: 2016-03-27 | Disposition: A | Discharge status: Home / Self Care | Payer: Medicare Other | Source: Ambulatory Visit | Attending: Internal Medicine | Admitting: Internal Medicine

## 2016-03-27 DIAGNOSIS — R911 Solitary pulmonary nodule: Secondary | ICD-10-CM

## 2016-03-27 DIAGNOSIS — I7090 Unspecified atherosclerosis: Secondary | ICD-10-CM | POA: Diagnosis not present

## 2016-03-27 DIAGNOSIS — I2510 Atherosclerotic heart disease of native coronary artery without angina pectoris: Secondary | ICD-10-CM | POA: Insufficient documentation

## 2016-03-27 LAB — GLUCOSE, CAPILLARY: GLUCOSE-CAPILLARY: 83 mg/dL (ref 65–99)

## 2016-03-27 MED ORDER — FLUDEOXYGLUCOSE F - 18 (FDG) INJECTION
12.1200 | Freq: Once | INTRAVENOUS | Status: AC | PRN
  Administered 2016-03-27: 12.12 via INTRAVENOUS

## 2016-04-04 ENCOUNTER — Inpatient Hospital Stay: Payer: Medicare Other | Attending: Hematology and Oncology | Admitting: Hematology and Oncology

## 2016-04-04 VITALS — BP 122/76 | HR 80 | Temp 96.5°F | Resp 18 | Ht 66.0 in | Wt 151.9 lb

## 2016-04-04 DIAGNOSIS — R911 Solitary pulmonary nodule: Secondary | ICD-10-CM | POA: Insufficient documentation

## 2016-04-04 DIAGNOSIS — Z7722 Contact with and (suspected) exposure to environmental tobacco smoke (acute) (chronic): Secondary | ICD-10-CM | POA: Diagnosis not present

## 2016-04-04 DIAGNOSIS — R296 Repeated falls: Secondary | ICD-10-CM | POA: Diagnosis not present

## 2016-04-04 DIAGNOSIS — Z8542 Personal history of malignant neoplasm of other parts of uterus: Secondary | ICD-10-CM | POA: Diagnosis not present

## 2016-04-04 DIAGNOSIS — Z8701 Personal history of pneumonia (recurrent): Secondary | ICD-10-CM | POA: Diagnosis not present

## 2016-04-04 DIAGNOSIS — Z803 Family history of malignant neoplasm of breast: Secondary | ICD-10-CM | POA: Diagnosis not present

## 2016-04-04 DIAGNOSIS — K2270 Barrett's esophagus without dysplasia: Secondary | ICD-10-CM | POA: Diagnosis not present

## 2016-04-04 DIAGNOSIS — E039 Hypothyroidism, unspecified: Secondary | ICD-10-CM | POA: Diagnosis not present

## 2016-04-04 DIAGNOSIS — Z79899 Other long term (current) drug therapy: Secondary | ICD-10-CM | POA: Diagnosis not present

## 2016-04-04 DIAGNOSIS — R0602 Shortness of breath: Secondary | ICD-10-CM | POA: Insufficient documentation

## 2016-04-04 DIAGNOSIS — R490 Dysphonia: Secondary | ICD-10-CM | POA: Insufficient documentation

## 2016-04-04 DIAGNOSIS — G4700 Insomnia, unspecified: Secondary | ICD-10-CM | POA: Insufficient documentation

## 2016-04-04 DIAGNOSIS — I10 Essential (primary) hypertension: Secondary | ICD-10-CM | POA: Diagnosis not present

## 2016-04-04 DIAGNOSIS — C3412 Malignant neoplasm of upper lobe, left bronchus or lung: Secondary | ICD-10-CM | POA: Diagnosis not present

## 2016-04-04 DIAGNOSIS — M1990 Unspecified osteoarthritis, unspecified site: Secondary | ICD-10-CM | POA: Insufficient documentation

## 2016-04-04 DIAGNOSIS — F329 Major depressive disorder, single episode, unspecified: Secondary | ICD-10-CM

## 2016-04-04 DIAGNOSIS — E785 Hyperlipidemia, unspecified: Secondary | ICD-10-CM | POA: Insufficient documentation

## 2016-04-04 DIAGNOSIS — R5383 Other fatigue: Secondary | ICD-10-CM | POA: Insufficient documentation

## 2016-04-04 NOTE — Progress Notes (Signed)
Pt verbalizes mild fatigue.  Pt has had pnuemonia x2 on 1/5 and then again on Feb 23rd.  Pt always has a hoariness in her voice and always trying to clear her throat feeling.  Pt has had 4 falls last year.  Dr. Melrose Nakayama from [facility] followed pt because pt has tendency to lean to the left. plus Pt passed out in her husbands hospital room on 12/30 and that was another reason nuero was to follow.  Pt reports being exhausted from husbands hospitalization and going back and forth with little sleep.  Pt reports having PET scan last Monday and CT last Jan.  Pt reports a left neck pain that had increased in fall.

## 2016-04-05 DIAGNOSIS — M79606 Pain in leg, unspecified: Secondary | ICD-10-CM | POA: Insufficient documentation

## 2016-04-05 DIAGNOSIS — E785 Hyperlipidemia, unspecified: Secondary | ICD-10-CM | POA: Insufficient documentation

## 2016-04-05 DIAGNOSIS — F32A Depression, unspecified: Secondary | ICD-10-CM | POA: Insufficient documentation

## 2016-04-05 DIAGNOSIS — F329 Major depressive disorder, single episode, unspecified: Secondary | ICD-10-CM | POA: Insufficient documentation

## 2016-04-06 ENCOUNTER — Ambulatory Visit (INDEPENDENT_AMBULATORY_CARE_PROVIDER_SITE_OTHER): Payer: Medicare Other | Admitting: Cardiothoracic Surgery

## 2016-04-06 ENCOUNTER — Encounter: Payer: Self-pay | Admitting: Cardiothoracic Surgery

## 2016-04-06 VITALS — BP 144/80 | HR 83 | Temp 97.9°F | Ht 66.0 in | Wt 151.8 lb

## 2016-04-06 DIAGNOSIS — R918 Other nonspecific abnormal finding of lung field: Secondary | ICD-10-CM | POA: Diagnosis not present

## 2016-04-06 NOTE — Patient Instructions (Addendum)
If you would like to do radiation or surgery, You will need to have a CT guided biopsy of this lung nodule. Our office will call you with the details of this appointment.  If you decide on having surgery, you will not need to have the breathing studies scheduled on Tuesday.  Please call and let me know what you will decide, next week and we will assist you on getting anything coordinated and scheduled that needs to be done.  You will also need to see Dr. Donella Stade (Radiation Doctor). We have placed this referral for you and will call with an appointment as soon as we have this.

## 2016-04-06 NOTE — Progress Notes (Signed)
Patient ID: Lauren Hammond, female   DOB: 1932-11-09, 82 y.o.   MRN: [id number]  Chief Complaint  Patient presents with  . Lung Lesion    Referred By Dr. Nolon Stalls Reason for Referral left upper lobe mass  HPI Location, Quality, Duration, Severity, Timing, Context, Modifying Factors, Associated Signs and Symptoms.  Lauren Hammond is a 82 y.o. female.  She was in her usual state of health until January of this year when she had an episode of pneumonia. She was hospitalized and treated with appropriate antibiotics. Soon thereafter she again experienced episodes a cough and low-grade fever and presented to the emergency department where chest x-ray was made and a CT scan. The CT scan showed a mass in the left upper lobe measuring about 1-1.5 cm. The patient then followed up with Dr. Braulio Conte D. He performed a PET scan which revealed uptake in the nodule but no evidence of distant disease. Patient is sent sent to Dr. Mike Gip and she recommended follow-up with me. I saw her today to discuss the options for management of her PET positive less than 2 cm left upper lobe lesion. She is scheduled to undergo pulmonary function testing next week. She is a lifelong nonsmoker. She has no family history of lung cancer. She states that her symptoms of pneumonia now cleared. She has no cough or fever. She's had no weight loss.   Past Medical History  Diagnosis Date  . HTN (hypertension)   . Depression   . Hypothyroid   . Insomnia   . Hyperlipemia   . Generalized OA   . Barrett esophagus   . Cancer [facility])     uterus ca    Past Surgical History  Procedure Laterality Date  . Right breast bx    . Appendectomy    . Abdominal hysterectomy  [place], [place]  . Breast biopsy Left 1980's  . Knee arthroscopy Right 2000    [facility]    Family History  Problem Relation Age of Onset  . Hypertension    . Breast cancer Maternal Aunt   . Breast cancer Sister     Social  History Social History  Substance Use Topics  . Smoking status: Never Smoker   . Smokeless tobacco: Never Used  . Alcohol Use: No    Allergies  Allergen Reactions  . Atorvastatin Other (See Comments)    Muscle Aches  . Bupropion Other (See Comments)    Unspecified  . Other Other (See Comments)    Unspecified Reaction: patient was unable to communicate and family member was unable to tell me any allergies   . Percodan [Oxycodone-Aspirin]   . Pravastatin Other (See Comments)    Muscle Aches    Current Outpatient Prescriptions  Medication Sig Dispense Refill  . amLODipine (NORVASC) 10 MG tablet Take 0.5 tablets (5 mg total) by mouth daily. 30 tablet 0  . HYDROcodone-acetaminophen (NORCO/VICODIN) 5-325 MG tablet Take 1 tablet by mouth every 6 (six) hours as needed for moderate pain.    Marland Kitchen ibuprofen (ADVIL,MOTRIN) 600 MG tablet Take 600 mg by mouth every 6 (six) hours as needed.    . lovastatin (MEVACOR) 20 MG tablet Take 20 mg by mouth daily.    . Multiple Vitamin (MULTI-VITAMINS) TABS Take 1 tablet by mouth daily.    Marland Kitchen omeprazole (PRILOSEC) 20 MG capsule Take 40 mg by mouth 2 (two) times daily before a meal.    . sertraline (ZOLOFT) 100 MG tablet Take  100 mg by mouth daily.    Marland Kitchen SYNTHROID 88 MCG tablet Take 88 mcg by mouth daily before breakfast.    . temazepam (RESTORIL) 15 MG capsule Take 1 capsule by mouth at bedtime as needed.    . zolpidem (AMBIEN) 5 MG tablet Take 5 mg by mouth at bedtime as needed for sleep. Reported on 04/04/2016     No current facility-administered medications for this visit.      Review of Systems A complete review of systems was asked and was negative except for the following positive findingsOccasional hoarseness, swelling of her ankles, shortness of breath with exertion, heartburn, joint pain, headache, depression, frequent urination.  Blood pressure 144/80, pulse 83, temperature 97.9 F (36.6 C), temperature source Oral, height '5\' 6"'$  (1.676 m),  weight 151 lb 12.8 oz (68.856 kg).  Physical Exam CONSTITUTIONAL:  Pleasant, well-developed, well-nourished, and in no acute distress. EYES: Pupils equal and reactive to light, Sclera non-icteric EARS, NOSE, MOUTH AND THROAT:  The oropharynx was clear.  Dentition is good repair.  Oral mucosa pink and moist. LYMPH NODES:  Lymph nodes in the neck and axillae were normal RESPIRATORY:  Lungs were clear.  Normal respiratory effort without pathologic use of accessory muscles of respiration CARDIOVASCULAR: Heart was regular without murmurs.  There were no carotid bruits. GI: The abdomen was soft, nontender, and nondistended. There were no palpable masses. There was no hepatosplenomegaly. There were normal bowel sounds in all quadrants. GU:  Rectal deferred.   MUSCULOSKELETAL:  Normal muscle strength and tone.  No clubbing or cyanosis.   SKIN:  There were no pathologic skin lesions.  There were no nodules on palpation. NEUROLOGIC:  Sensation is normal.  Cranial nerves are grossly intact. PSYCH:  Oriented to person, place and time.  Mood and affect are normal.  Data Reviewed Chest x-ray, PET scan and CT scans  I have personally reviewed the patient's imaging, laboratory findings and medical records.    Assessment    I have independently reviewed the chest CT and PET scan. There is a left upper lobe mass which appears to represent a non-small cell carcinoma the lung. I see no evidence of distant disease. The patient is scheduled to have pulmonary function testing next week.    Plan    I had a long discussion with her today. She is accompanied by her daughter. We reviewed the options for management. These would include surgical resection or radiation therapy. I reviewed with her in detail the indications and risks of surgery as well as the various types of procedures can be performed including a wedge resection versus lobectomy. I explained her the advantages and disadvantages. At the present time  she would like to meet with Dr. Donella Stade to discuss this radiation therapy. She also will keep her appointment for pulmonary function testing will contact our office next week to review those. I will be happy to see her in follow-up if need be. At the present time she will continue her follow-up with Dr. Mike Gip next week.       Nestor Lewandowsky, MD 04/06/2016, 12:42 PM

## 2016-04-09 ENCOUNTER — Other Ambulatory Visit: Payer: Self-pay | Admitting: *Deleted

## 2016-04-09 ENCOUNTER — Encounter: Payer: Self-pay | Admitting: Radiation Oncology

## 2016-04-09 ENCOUNTER — Ambulatory Visit
Admission: RE | Admit: 2016-04-09 | Discharge: 2016-04-09 | Disposition: A | Discharge status: Home / Self Care | Payer: Medicare Other | Source: Ambulatory Visit | Attending: Radiation Oncology | Admitting: Radiation Oncology

## 2016-04-09 ENCOUNTER — Telehealth: Payer: Self-pay | Admitting: *Deleted

## 2016-04-09 VITALS — BP 156/82 | HR 81 | Temp 96.4°F | Resp 20 | Ht 66.0 in | Wt 152.6 lb

## 2016-04-09 DIAGNOSIS — R490 Dysphonia: Secondary | ICD-10-CM | POA: Diagnosis not present

## 2016-04-09 DIAGNOSIS — R05 Cough: Secondary | ICD-10-CM | POA: Diagnosis not present

## 2016-04-09 DIAGNOSIS — R918 Other nonspecific abnormal finding of lung field: Secondary | ICD-10-CM

## 2016-04-09 DIAGNOSIS — Z923 Personal history of irradiation: Secondary | ICD-10-CM | POA: Insufficient documentation

## 2016-04-09 DIAGNOSIS — C3412 Malignant neoplasm of upper lobe, left bronchus or lung: Secondary | ICD-10-CM | POA: Diagnosis not present

## 2016-04-09 NOTE — Progress Notes (Signed)
Verbal and written education reviewed with patient, daughter, and granddaughter.  All verbalized understanding.  All questions answered to their satisfaction.  Approximately 10 minutes spent on education.

## 2016-04-09 NOTE — Consult Note (Signed)
Except an outstanding is perfect of Radiation Oncology NEW PATIENT EVALUATION  Name: Lauren Hammond  MRN: [id number]  Date:   04/09/2016     DOB: [birth year]   This 82 y.o. female patient presents to the clinic for initial evaluation of probable stage I (T1 N0 M0) non-small cell lung cancer of the left upper lobe.  REFERRING PHYSICIAN: Tracie Harrier, MD  CHIEF COMPLAINT:  Chief Complaint  Patient presents with  . Lung Cancer    Pt is here for initial consultation of lung cancer.      DIAGNOSIS: The encounter diagnosis was Lung mass.   PREVIOUS INVESTIGATIONS:  PET CT and CT scans reviewed Clinical notes reviewed CT-guided FNA ordered  HPI: Patient is a pleasant 82 year old female developed in January of this year an episode of pneumonia where CT scan demonstrated a left upper lobe nodule measuring approximate 1.5 cm. PET CT was performed showing hypermetabolic activity in the left upper lobe nodule no evidence of disease in mediastinum or hilar regions. She's been seen by medical oncology as well as surgical oncology were opinions have been rendered she is now referred to radiation oncology for consideration of SB RT. She specifically denies cough hemoptysis or chest tightness. She is not had a biopsy of her lesion at this point.  PLANNED TREATMENT REGIMEN: Possible SB RT  PAST MEDICAL HISTORY:  has a past medical history of HTN (hypertension); Depression; Hypothyroid; Insomnia; Hyperlipemia; Generalized OA; Barrett esophagus; and Cancer ([place]).    PAST SURGICAL HISTORY:  Past Surgical History  Procedure Laterality Date  . Right breast bx    . Appendectomy    . Abdominal hysterectomy  [place], [place]  . Breast biopsy Left 1980's  . Knee arthroscopy Right 2000    [facility]    FAMILY HISTORY: family history includes Breast cancer in her maternal aunt and sister.  SOCIAL HISTORY:  reports that she has never smoked. She has never used smokeless tobacco.  She reports that she does not drink alcohol or use illicit drugs.  ALLERGIES: Atorvastatin; Bupropion; Other; Percodan; and Pravastatin  MEDICATIONS:  Current Outpatient Prescriptions  Medication Sig Dispense Refill  . amLODipine (NORVASC) 10 MG tablet Take 0.5 tablets (5 mg total) by mouth daily. 30 tablet 0  . HYDROcodone-acetaminophen (NORCO/VICODIN) 5-325 MG tablet Take 1 tablet by mouth every 6 (six) hours as needed for moderate pain.    Marland Kitchen ibuprofen (ADVIL,MOTRIN) 600 MG tablet Take 600 mg by mouth every 6 (six) hours as needed.    . lovastatin (MEVACOR) 20 MG tablet Take 20 mg by mouth daily.    . Multiple Vitamin (MULTI-VITAMINS) TABS Take 1 tablet by mouth daily.    Marland Kitchen omeprazole (PRILOSEC) 20 MG capsule Take 40 mg by mouth 2 (two) times daily before a meal.    . sertraline (ZOLOFT) 100 MG tablet Take 100 mg by mouth daily.    Marland Kitchen SYNTHROID 88 MCG tablet Take 88 mcg by mouth daily before breakfast.    . temazepam (RESTORIL) 15 MG capsule Take 1 capsule by mouth at bedtime as needed.    . zolpidem (AMBIEN) 5 MG tablet Take 5 mg by mouth at bedtime as needed for sleep. Reported on 04/04/2016     No current facility-administered medications for this encounter.    ECOG PERFORMANCE STATUS:  0 - Asymptomatic  REVIEW OF SYSTEMS:  Patient denies any weight loss, fatigue, weakness, fever, chills or night sweats. Patient denies any loss of vision, blurred vision.  Patient denies any ringing  of the ears or hearing loss. No irregular heartbeat. Patient denies heart murmur or history of fainting. Patient denies any chest pain or pain radiating to her upper extremities. Patient denies any shortness of breath, difficulty breathing at night, cough or hemoptysis. Patient denies any swelling in the lower legs. Patient denies any nausea vomiting, vomiting of blood, or coffee ground material in the vomitus. Patient denies any stomach pain. Patient states has had normal bowel movements no significant  constipation or diarrhea. Patient denies any dysuria, hematuria or significant nocturia. Patient denies any problems walking, swelling in the joints or loss of balance. Patient denies any skin changes, loss of hair or loss of weight. Patient denies any excessive worrying or anxiety or significant depression. Patient denies any problems with insomnia. Patient denies excessive thirst, polyuria, polydipsia. Patient denies any swollen glands, patient denies easy bruising or easy bleeding. Patient denies any recent infections, allergies or URI. Patient "s visual fields have not changed significantly in recent time.    PHYSICAL EXAM: BP 156/82 mmHg  Pulse 81  Temp(Src) 96.4 F (35.8 C)  Resp 20  Ht '5\' 6"'$  (1.676 m)  Wt 152 lb 8.9 oz (69.2 kg)  BMI 24.64 kg/m2 Well-developed well-nourished patient in NAD. HEENT reveals PERLA, EOMI, discs not visualized.  Oral cavity is clear. No oral mucosal lesions are identified. Neck is clear without evidence of cervical or supraclavicular adenopathy. Lungs are clear to A&P. Cardiac examination is essentially unremarkable with regular rate and rhythm without murmur rub or thrill. Abdomen is benign with no organomegaly or masses noted. Motor sensory and DTR levels are equal and symmetric in the upper and lower extremities. Cranial nerves II through XII are grossly intact. Proprioception is intact. No peripheral adenopathy or edema is identified. No motor or sensory levels are noted. Crude visual fields are within normal range.  LABORATORY DATA: No current data for review    RADIOLOGY RESULTS: CT scans and PET/CT scan reviewed   IMPRESSION: Probable non-small cell lung cancer of the left upper lobe stage I in 82 year old female  PLAN: At this time I have discussed SB RT with the patient and her family. I believe this lesion would be amenable to SB RT treating this to 5000 cGy over 5 treatments. I have set up and ordered a CT-guided fine-needle aspiration of the lesion  as I believe it would be possible to angle underneath her sternum and obtain tissue diagnosis prior to SB RT. Should this not be able to be done in a safe manner would proceed to SB RT with the overall [place] presumption this is a non-small cell lung cancer based on its radiologic findings of hypermetabolic activity. All my recommendations were reviewed with the patient and her family and they all seem to comprehend my treatment plan well. Risks and benefits of SB RT including possible development of cough fatigue possible slight radiation esophagitis sitters) proximity to the esophagus skin reaction all were reviewed in detail with the patient. I have set her up for the fine-needle aspiration followed about a week several days later for CT simulation.  I would like to take this opportunity for allowing me to participate in the care of your patient.Armstead Peaks., MD

## 2016-04-09 NOTE — Telephone Encounter (Signed)
Coordinated and notified patient of radiation oncology appt this am at 71. Verbalized understanding.

## 2016-04-10 ENCOUNTER — Ambulatory Visit: Payer: Medicare Other

## 2016-04-12 ENCOUNTER — Telehealth: Payer: Self-pay | Admitting: *Deleted

## 2016-04-12 NOTE — Telephone Encounter (Signed)
Ms Kilner was notified of CT biopsy appointment in [place] at [facility] due to [place] down at [facility].  Appointment is 04/18/16 with 8:45 arrival.  All instructions given, along with address of [facility] hospital.  Pt acknowledged appointment and instructions with read back.  Appt.  Also given for follow up with Dr. Baruch Gouty post biopsy on 04/30/16.

## 2016-04-13 ENCOUNTER — Inpatient Hospital Stay (HOSPITAL_BASED_OUTPATIENT_CLINIC_OR_DEPARTMENT_OTHER): Payer: Medicare Other | Admitting: Hematology and Oncology

## 2016-04-13 VITALS — BP 126/71 | HR 79 | Temp 96.1°F | Resp 18 | Wt 151.9 lb

## 2016-04-13 DIAGNOSIS — E039 Hypothyroidism, unspecified: Secondary | ICD-10-CM

## 2016-04-13 DIAGNOSIS — Z79899 Other long term (current) drug therapy: Secondary | ICD-10-CM

## 2016-04-13 DIAGNOSIS — R490 Dysphonia: Secondary | ICD-10-CM | POA: Diagnosis not present

## 2016-04-13 DIAGNOSIS — E785 Hyperlipidemia, unspecified: Secondary | ICD-10-CM

## 2016-04-13 DIAGNOSIS — Z803 Family history of malignant neoplasm of breast: Secondary | ICD-10-CM

## 2016-04-13 DIAGNOSIS — Z8542 Personal history of malignant neoplasm of other parts of uterus: Secondary | ICD-10-CM

## 2016-04-13 DIAGNOSIS — G4700 Insomnia, unspecified: Secondary | ICD-10-CM

## 2016-04-13 DIAGNOSIS — M1990 Unspecified osteoarthritis, unspecified site: Secondary | ICD-10-CM

## 2016-04-13 DIAGNOSIS — R911 Solitary pulmonary nodule: Secondary | ICD-10-CM

## 2016-04-13 DIAGNOSIS — R296 Repeated falls: Secondary | ICD-10-CM

## 2016-04-13 DIAGNOSIS — Z8701 Personal history of pneumonia (recurrent): Secondary | ICD-10-CM

## 2016-04-13 DIAGNOSIS — R0602 Shortness of breath: Secondary | ICD-10-CM

## 2016-04-13 DIAGNOSIS — R5383 Other fatigue: Secondary | ICD-10-CM

## 2016-04-13 DIAGNOSIS — Z7722 Contact with and (suspected) exposure to environmental tobacco smoke (acute) (chronic): Secondary | ICD-10-CM

## 2016-04-13 DIAGNOSIS — I10 Essential (primary) hypertension: Secondary | ICD-10-CM

## 2016-04-13 DIAGNOSIS — F329 Major depressive disorder, single episode, unspecified: Secondary | ICD-10-CM

## 2016-04-13 DIAGNOSIS — K2270 Barrett's esophagus without dysplasia: Secondary | ICD-10-CM

## 2016-04-13 NOTE — Progress Notes (Signed)
[place] [facility] day:  04/13/2016  Chief Complaint: Lauren Hammond is a 82 y.o. female  With a left upper lobe nodule who is seen for reassessment after interval thoracic surgery and radiation oncology consultations.  HPI: The patient was last seen in the medical oncology clinic on 04/04/2016.  At that time, she was seen for initial consultation regarding a left upper lobe lung mass discovered after a syncopal event and pneumonia.  She had no history of smoking.  PET scan revealed no evidence of metastatic disease.  Symptomatically, she only noted fatigue and mild shortness of breath with exertion.  She also notes some achiness after her falls.  She was seen by Dr. Nestor Lewandowsky on 04/06/2016.  Discussions were held regarding the risks of surgery (wedge resection versus lobectomy).  She was referred to Dr. Baruch Gouty of radiation oncology to discuss the risk versus benefit of local radiation.  She was seen by Dr. Noreene Filbert on 04/09/2016.  Discussions were held regarding stereotactic body radiation (SBRT) to 5000 cGy over 5 treatments to the nodule.  A CT guided fine needle aspirate was scheduled on 04/18/2016.  Notes indicate that if the procedure could not be done, he would proceed with radiation with the assumption that this nodule respresents non-small cell carcinoma.  Symptomatically, she denies any new complaints.   Past Medical History  Diagnosis Date  . HTN (hypertension)   . Depression   . Hypothyroid   . Insomnia   . Hyperlipemia   . Generalized OA   . Barrett esophagus   . Cancer [facility])     uterus ca    Past Surgical History  Procedure Laterality Date  . Right breast bx    . Appendectomy    . Abdominal hysterectomy  [place], [place]  . Breast biopsy Left 1980's  . Knee arthroscopy Right 2000    [facility]    Family History  Problem Relation Age of Onset  . Hypertension    . Breast cancer Maternal Aunt   .  Breast cancer Sister     Social History:  reports that she has never smoked. She has never used smokeless tobacco. She reports that she does not drink alcohol or use illicit drugs.  The patient is accompanied by her daughter, Lauren Hammond, today.  Allergies:  Allergies  Allergen Reactions  . Atorvastatin Other (See Comments)    Muscle Aches  . Bupropion Other (See Comments)    Unspecified  . Other Other (See Comments)    Unspecified Reaction: patient was unable to communicate and family member was unable to tell me any allergies   . Percodan [Oxycodone-Aspirin]   . Pravastatin Other (See Comments)    Muscle Aches    Current Medications: Current Outpatient Prescriptions  Medication Sig Dispense Refill  . amLODipine (NORVASC) 10 MG tablet Take 0.5 tablets (5 mg total) by mouth daily. 30 tablet 0  . HYDROcodone-acetaminophen (NORCO/VICODIN) 5-325 MG tablet Take 1 tablet by mouth every 6 (six) hours as needed for moderate pain.    Marland Kitchen ibuprofen (ADVIL,MOTRIN) 600 MG tablet Take 600 mg by mouth every 6 (six) hours as needed.    . lovastatin (MEVACOR) 20 MG tablet Take 20 mg by mouth daily.    . Multiple Vitamin (MULTI-VITAMINS) TABS Take 1 tablet by mouth daily.    Marland Kitchen omeprazole (PRILOSEC) 20 MG capsule Take 40 mg by mouth 2 (two) times daily before a meal.    .  sertraline (ZOLOFT) 100 MG tablet Take 100 mg by mouth daily.    Marland Kitchen SYNTHROID 88 MCG tablet Take 88 mcg by mouth daily before breakfast.    . temazepam (RESTORIL) 15 MG capsule Take 1 capsule by mouth at bedtime as needed.    . zolpidem (AMBIEN) 5 MG tablet Take 5 mg by mouth at bedtime as needed for sleep. Reported on 04/04/2016     No current facility-administered medications for this visit.    Review of Systems:  GENERAL: Little fatigue. No fevers, sweats or weight loss. Weight typically 149-151 pounds. PERFORMANCE STATUS (ECOG): 1 HEENT: Needs reading glasses. Dry eyes.  Lungs:  No shortness of breath. Cough. No  hemoptysis. Cardiac: No chest pain, palpitations, orthopnea, or PND. GI: Takes omeprazole for stomach issues. Constipation. No nausea, vomiting, diarrhea, melena or hematochezia. GU: Decreased bladder emptying (chronic). No urgency, frequency, dysuria, or hematuria. Musculoskeletal: No back pain. No joint pain. No muscle tenderness. Extremities: Achiness in neck, shoulders, and legs. No swelling. Skin: No rashes or skin changes. Neuro: Rare headache. Tends to walk to the left for 2 years. No numbness or weakness or coordination issues. Endocrine: No diabetes. Thyroid disease on Synthroid. No hot flashes or night sweats. Psych: Poor sleep. Anxious. No depression. Pain: No focal pain. Review of systems: All other systems reviewed and found to be negative.  Physical Exam: Blood pressure 126/71, pulse 79, temperature 96.1 F (35.6 C), temperature source Tympanic, resp. rate 18, weight 151 lb 14.4 oz (68.9 kg). GENERAL: Well developed, well nourished, woman who appears younger than state age. She is sitting comfortably in the exam room in no acute distress. MENTAL STATUS: Alert and oriented to person, place and time. HEAD: Short brown hair. Normocephalic, atraumatic, face symmetric, no Cushingoid features. EYES: Blue eyes. No conjunctivitis or scleral icterus. NEUROLOGICAL: Unremarkable. PSYCH: Appropriate.   No visits with results within 3 Day(s) from this visit. Latest known visit with results is:  Hospital Outpatient Visit on 03/27/2016  Component Date Value Ref Range Status  . Glucose-Capillary 03/27/2016 83  65 - 99 mg/dL Final    Assessment:  Lauren Hammond is a 82 y.o. female with a left upper lobe mass discovered on imaging following a episode of syncope and pneumonia. She has no smoking history.   Chest CT on 12/31/2015 revealed a 1.8 cm mildy spiculated nodule in the anterior left upper lobe suspicious for lung cancer. PET scan on 03/27/2016  revealed a 1.3 x 1.4 cm spiculated left upper lobe nodule (SUV 3.0) consistent with primary bronchogenic carcinoma. There was no evidence of thoracic nodal or extrathoracic metastatic disease.  Head CT on 12/01/2015 revealed prominent ventricles with normal appearing sulci. There was mild periventricular disease and mastoid air cell disease bilaterally.   Symptomatically, she notes mild fatigue and shortness of breath on exertion.  Exam is unremarkable.  Plan: 1.  Review thoracic surgery and radiation oncology consultations.  Discuss risks and benefits of surgery versus radiation.  Discuss her age and tolerance of surgery.  Discuss limited morbidity from radiation.  Discuss need for biopsy and pathologic diagnosis.  Discuss various types of lung cancer.  Suspect this represents a early (stage IA) non-small cell lung cancer.  She will likely do well with radiation given her age.  Discuss plans for ongoing surveillance scans after treatment. 2.  Anticipate CT guided biopsy on 04/18/2016 in [place]. 3.  Phone follow-up results when pathology available. 4.  Patient has return appointment with radiation on 04/30/2016. 5.  RTC  after radiation.   Lequita Asal, MD  04/13/2016, 11:38 AM

## 2016-04-15 ENCOUNTER — Encounter: Payer: Self-pay | Admitting: Hematology and Oncology

## 2016-04-15 NOTE — Progress Notes (Signed)
[facility] day:  04/04/2016  Chief Complaint: Lauren Hammond is a 82 y.o. female with a left lung nodule who is referred by Dr. Tracie Harrier for assessment and management.  HPI:  The patient denies any history of smoking.  She describes second hand smoke exposure for 16 years.  She notes chronic hoarseness and shortness of breath on exertion.  She describes a recent history of falls.  She had a syncopal event while visiting the hospital.    She was admitted to [facility] from 12/01/2015 - 12/03/2015 with acute laryngitis and pneumonia following her syncopal event.  Head CT on 12/01/2015 revealed prominent ventricles with normal appearing sulci.  There was mild periventricular disease and mastoid air cell disease bilaterally.  CXR revealed mild bibasilar atelectasis/infiltrate.  Echo on 12/02/2015 revealed an EF of 50-55%.  She was treated with IV antibiotics and switched to Azithromycin and Ceftin.  She was seen in follow-up at the [facility] on 12/31/2015.  She felt worse than prior to her hospitalization with increased shortness of breath and cough.  CXR revealed left upper lobe nodular density.  Chest CT on 12/31/2015 revealed a 1.8 cm mildly spiculated nodule in the anterior left upper lobe suspicious for lung cancer.  PET scan on 03/27/2016 revealed a 1.3 x 1.4 cm spiculated left upper lobe nodule (SUV 3.0) consistent with primary bronchogenic carcinoma.  There was no evidence of thoracic nodal or extrathoracic metastatic disease. Clinical stage was T1N0M0 or stage IA.  She states that she has been followed by Dr. Gurney Maxin of neurology for her falls.  She is not felt to have normal pressure hydrocephalus.  Etiology was felt secondary to concurrent illness and possibly dehydration.  She describes being exhausted with little sleep secondary to her husband's hospitalizations.  Symptomatically, she feels a little tired. She has a cough. Sleep  remains poor. Weight is stable. She denies any bone pain.    Past Medical History  Diagnosis Date  . HTN (hypertension)   . Depression   . Hypothyroid   . Insomnia   . Hyperlipemia   . Generalized OA   . Barrett esophagus   . Cancer [facility])     uterus ca    Past Surgical History  Procedure Laterality Date  . Right breast bx    . Appendectomy    . Abdominal hysterectomy  [place], [place]  . Breast biopsy Left 1980's  . Knee arthroscopy Right 2000    [facility]    Family History  Problem Relation Age of Onset  . Hypertension    . Breast cancer Maternal Aunt   . Breast cancer Sister     Social History:  reports that she has never smoked. She has never used smokeless tobacco. She reports that she does not drink alcohol or use illicit drugs.  The patient is accompanied by her husband, Matthew Saras, and her daughter, Davy Pique, today.  Allergies:  Allergies  Allergen Reactions  . Atorvastatin Other (See Comments)    Muscle Aches  . Bupropion Other (See Comments)    Unspecified  . Other Other (See Comments)    Unspecified Reaction: patient was unable to communicate and family member was unable to tell me any allergies   . Percodan [Oxycodone-Aspirin]   . Pravastatin Other (See Comments)    Muscle Aches    Current Medications: Current Outpatient Prescriptions  Medication Sig Dispense Refill  . amLODipine (NORVASC) 10 MG tablet  Take 0.5 tablets (5 mg total) by mouth daily. 30 tablet 0  . lovastatin (MEVACOR) 20 MG tablet Take 20 mg by mouth daily.    Marland Kitchen omeprazole (PRILOSEC) 20 MG capsule Take 40 mg by mouth 2 (two) times daily before a meal.    . sertraline (ZOLOFT) 100 MG tablet Take 100 mg by mouth daily.    Marland Kitchen SYNTHROID 88 MCG tablet Take 88 mcg by mouth daily before breakfast.    . HYDROcodone-acetaminophen (NORCO/VICODIN) 5-325 MG tablet Take 1 tablet by mouth every 6 (six) hours as needed for moderate pain.    Marland Kitchen ibuprofen (ADVIL,MOTRIN) 600 MG tablet Take  600 mg by mouth every 6 (six) hours as needed.    . Multiple Vitamin (MULTI-VITAMINS) TABS Take 1 tablet by mouth daily.    . temazepam (RESTORIL) 15 MG capsule Take 1 capsule by mouth at bedtime as needed.    . zolpidem (AMBIEN) 5 MG tablet Take 5 mg by mouth at bedtime as needed for sleep. Reported on 04/04/2016     No current facility-administered medications for this visit.    Review of Systems:  GENERAL:  Little fatigue.  No fevers, sweats or weight loss.  Weight typically 149-151 pounds. PERFORMANCE STATUS (ECOG):  1 HEENT:  Ears congested.  Needs reading glasses.  Dry eyes.  Sore throat off/on.  Saw ENT for sinus issues.   Lungs: No shortness of breath.  Cough.  No hemoptysis. Cardiac:  No chest pain, palpitations, orthopnea, or PND. GI:  Takes omeprazole for stomach issues.  Constipation.  No nausea, vomiting, diarrhea, melena or hematochezia. GU:  Decreased bladder emptying.  No urgency, frequency, dysuria, or hematuria. Musculoskeletal:  No back pain.  No joint pain.  No muscle tenderness. Extremities:  Achiness in neck, shoulders, and legs.  Sore left upper neck since fall.  No swelling. Skin:  No rashes or skin changes. Neuro:  Rare headache.  Tends to walk to the left for 2 years.  No numbness or weakness or coordination issues. Endocrine:  No diabetes.  Thyroid disease on Synthroid.  No hot flashes or night sweats. Psych:  Poor sleep.  Anxious.  No depression. Pain:  No focal pain. Review of systems:  All other systems reviewed and found to be negative.  Physical Exam: Blood pressure 122/76, pulse 80, temperature 96.5 F (35.8 C), temperature source Tympanic, resp. rate 18, height '5\' 6"'$  (1.676 m), weight 151 lb 14.4 oz (68.9 kg). GENERAL:  Well developed, well nourished, woman who appears younger than state age.  She is sitting comfortably in the exam room in no acute distress. MENTAL STATUS:  Alert and oriented to person, place and time. HEAD:  Short brown hair.   Normocephalic, atraumatic, face symmetric, no Cushingoid features. EYES:  Blue eyes.  Pupils equal round and reactive to light and accomodation.  No conjunctivitis or scleral icterus. ENT:  Oropharynx clear without lesion.  Tongue normal. Mucous membranes moist.  RESPIRATORY:  Clear to auscultation without rales, wheezes or rhonchi. CARDIOVASCULAR:  Regular rate and rhythm without murmur, rub or gallop. ABDOMEN:  Soft, non-tender, with active bowel sounds, and no hepatosplenomegaly.  No masses. SKIN:  No rashes, ulcers or lesions. EXTREMITIES: No edema, no skin discoloration or tenderness.  No palpable cords. LYMPH NODES: No palpable cervical, supraclavicular, axillary or inguinal adenopathy  NEUROLOGICAL: Unremarkable. PSYCH:  Appropriate.  No visits with results within 3 Day(s) from this visit. Latest known visit with results is:  Hospital Outpatient Visit on 03/27/2016  Component Date  Value Ref Range Status  . Glucose-Capillary 03/27/2016 83  65 - 99 mg/dL Final    Assessment:  Lauren Hammond is a 82 y.o. female with a left upper lobe mass discovered on imaging following a episode of syncope and pneumonia.  She has no smoking history.   Chest CT on 12/31/2015 revealed a 1.8 cm mildy spiculated nodule in the anterior left upper lobe suspicious for lung cancer.  PET scan on 03/27/2016 revealed a 1.3 x 1.4 cm spiculated left upper lobe nodule (SUV 3.0) consistent with primary bronchogenic carcinoma.  There was no evidence of thoracic nodal or extrathoracic metastatic disease.  Head CT on 12/01/2015 revealed prominent ventricles with normal appearing sulci.  There was mild periventricular disease and mastoid air cell disease bilaterally.   Symptomatically, she notes mild fatigue and shortness of breath on exertion.  Exam is unremarkable.  Plan: 1.  Review imaging studies with patient.  She appears to have a small (1.4 cm) left upper lobe nodule worrisome for lung cancer.  There is no  evidence of disease in intrathoracic lymph nodes or any metastatic disease.  We discussed biopsy for diagnosis or referral to thoracic surgery for possible resection.  We discussed pulmonary function tests.  If she is not a surgical candidate, or declines surgery, we discussed biopsy and then likely referral to radiation oncology for local radiation assuming this is a non-small cell lung cancer.  Several questions were asked and answered. 2.  Schedule pulmonary function tests. 3.  Consult thoracic surgery, Dr. Genevive Bi. 4.  Present at tumor board on 04/12/2016. 5.  RTC on 04/13/2016 in [place] for MD assessment and discussion regarding direction of therapy.    Lequita Asal, MD  04/04/2016

## 2016-04-16 ENCOUNTER — Other Ambulatory Visit: Payer: Self-pay | Admitting: Radiology

## 2016-04-17 ENCOUNTER — Other Ambulatory Visit: Payer: Self-pay | Admitting: Physician Assistant

## 2016-04-18 ENCOUNTER — Encounter (HOSPITAL_COMMUNITY): Payer: Self-pay

## 2016-04-18 ENCOUNTER — Ambulatory Visit (HOSPITAL_COMMUNITY)
Admission: RE | Admit: 2016-04-18 | Discharge: 2016-04-18 | Disposition: A | Discharge status: Home / Self Care | Payer: Medicare Other | Source: Ambulatory Visit | Attending: Interventional Radiology | Admitting: Interventional Radiology

## 2016-04-18 ENCOUNTER — Ambulatory Visit (HOSPITAL_COMMUNITY)
Admission: RE | Admit: 2016-04-18 | Discharge: 2016-04-18 | Disposition: A | Discharge status: Home / Self Care | Payer: Medicare Other | Source: Ambulatory Visit | Attending: Radiation Oncology | Admitting: Radiation Oncology

## 2016-04-18 DIAGNOSIS — Z888 Allergy status to other drugs, medicaments and biological substances status: Secondary | ICD-10-CM | POA: Insufficient documentation

## 2016-04-18 DIAGNOSIS — Z8249 Family history of ischemic heart disease and other diseases of the circulatory system: Secondary | ICD-10-CM | POA: Diagnosis not present

## 2016-04-18 DIAGNOSIS — R911 Solitary pulmonary nodule: Secondary | ICD-10-CM | POA: Diagnosis present

## 2016-04-18 DIAGNOSIS — Z79899 Other long term (current) drug therapy: Secondary | ICD-10-CM | POA: Insufficient documentation

## 2016-04-18 DIAGNOSIS — E785 Hyperlipidemia, unspecified: Secondary | ICD-10-CM | POA: Insufficient documentation

## 2016-04-18 DIAGNOSIS — Z9889 Other specified postprocedural states: Secondary | ICD-10-CM

## 2016-04-18 DIAGNOSIS — F329 Major depressive disorder, single episode, unspecified: Secondary | ICD-10-CM | POA: Diagnosis not present

## 2016-04-18 DIAGNOSIS — E039 Hypothyroidism, unspecified: Secondary | ICD-10-CM | POA: Diagnosis not present

## 2016-04-18 DIAGNOSIS — C3412 Malignant neoplasm of upper lobe, left bronchus or lung: Secondary | ICD-10-CM | POA: Insufficient documentation

## 2016-04-18 DIAGNOSIS — Z8542 Personal history of malignant neoplasm of other parts of uterus: Secondary | ICD-10-CM | POA: Diagnosis not present

## 2016-04-18 DIAGNOSIS — I10 Essential (primary) hypertension: Secondary | ICD-10-CM | POA: Diagnosis not present

## 2016-04-18 DIAGNOSIS — Z885 Allergy status to narcotic agent status: Secondary | ICD-10-CM | POA: Diagnosis not present

## 2016-04-18 DIAGNOSIS — Z803 Family history of malignant neoplasm of breast: Secondary | ICD-10-CM | POA: Insufficient documentation

## 2016-04-18 DIAGNOSIS — R918 Other nonspecific abnormal finding of lung field: Secondary | ICD-10-CM

## 2016-04-18 LAB — CBC WITH DIFFERENTIAL/PLATELET
BASOS ABS: 0 10*3/uL (ref 0.0–0.1)
BASOS PCT: 0 %
EOS ABS: 0.2 10*3/uL (ref 0.0–0.7)
Eosinophils Relative: 3 %
HEMATOCRIT: 44.3 % (ref 36.0–46.0)
HEMOGLOBIN: 14.8 g/dL (ref 12.0–15.0)
Lymphocytes Relative: 34 %
Lymphs Abs: 1.9 10*3/uL (ref 0.7–4.0)
MCH: 28.7 pg (ref 26.0–34.0)
MCHC: 33.4 g/dL (ref 30.0–36.0)
MCV: 86 fL (ref 78.0–100.0)
MONO ABS: 0.6 10*3/uL (ref 0.1–1.0)
Monocytes Relative: 11 %
NEUTROS ABS: 2.8 10*3/uL (ref 1.7–7.7)
NEUTROS PCT: 52 %
Platelets: 163 10*3/uL (ref 150–400)
RBC: 5.15 MIL/uL — ABNORMAL HIGH (ref 3.87–5.11)
RDW: 14.1 % (ref 11.5–15.5)
WBC: 5.4 10*3/uL (ref 4.0–10.5)

## 2016-04-18 LAB — BASIC METABOLIC PANEL
ANION GAP: 8 (ref 5–15)
BUN: 16 mg/dL (ref 6–20)
CALCIUM: 9.6 mg/dL (ref 8.9–10.3)
CO2: 26 mmol/L (ref 22–32)
Chloride: 104 mmol/L (ref 101–111)
Creatinine, Ser: 0.93 mg/dL (ref 0.44–1.00)
GFR, EST NON AFRICAN AMERICAN: 56 mL/min — AB (ref >60)
Glucose, Bld: 98 mg/dL (ref 65–99)
Potassium: 4 mmol/L (ref 3.5–5.1)
SODIUM: 138 mmol/L (ref 135–145)

## 2016-04-18 LAB — PROTIME-INR
INR: 1.02 (ref 0.00–1.49)
PROTHROMBIN TIME: 13.6 s (ref 11.6–15.2)

## 2016-04-18 LAB — APTT: APTT: 29 s (ref 24–37)

## 2016-04-18 MED ORDER — SODIUM CHLORIDE 0.9 % IV SOLN
INTRAVENOUS | Status: DC
  Administered 2016-04-18: 10:00:00 via INTRAVENOUS

## 2016-04-18 MED ORDER — LIDOCAINE-EPINEPHRINE 1 %-1:100000 IJ SOLN
INTRAMUSCULAR | Status: AC
  Filled 2016-04-18: qty 1

## 2016-04-18 MED ORDER — MIDAZOLAM HCL 2 MG/2ML IJ SOLN
INTRAMUSCULAR | Status: AC | PRN
  Administered 2016-04-18 (×2): 0.5 mg via INTRAVENOUS

## 2016-04-18 MED ORDER — MIDAZOLAM HCL 2 MG/2ML IJ SOLN
INTRAMUSCULAR | Status: AC
  Filled 2016-04-18: qty 2

## 2016-04-18 MED ORDER — FENTANYL CITRATE (PF) 100 MCG/2ML IJ SOLN
INTRAMUSCULAR | Status: AC | PRN
  Administered 2016-04-18: 12.5 ug via INTRAVENOUS
  Administered 2016-04-18: 25 ug via INTRAVENOUS

## 2016-04-18 MED ORDER — FENTANYL CITRATE (PF) 100 MCG/2ML IJ SOLN
INTRAMUSCULAR | Status: AC
  Filled 2016-04-18: qty 2

## 2016-04-18 NOTE — Discharge Instructions (Signed)

## 2016-04-18 NOTE — Sedation Documentation (Signed)
Denies pain off to short stay with sister and belongings

## 2016-04-18 NOTE — Procedures (Signed)
Technically successful CT guided biopsy of indeterminate hypermetabolic left upper lobe pulmonary nodule  EBL: None.  No immediate post procedural complications.   Ronny Bacon, MD Pager #: [phone]

## 2016-04-18 NOTE — H&P (Signed)
Chief Complaint: left lung nodule  Referring Physician:Dr. Noreene Filbert  Supervising Physician: Sandi Mariscal  Patient Status: Out-pt  HPI: Lauren Hammond is an 82 y.o. female who had an episode of PNA in January of 2017.  She had a CT scan that revealed a left upper lobe lung nodule that was 1.5cm.  She then underwent a PET scan that revealed hypermetabolic activity.  She has been referred to radiation oncology for radiation therapy.  Prior to proceeding with this treatment, a request has been made for a tissue biopsy to confirm suspected diagnosis of non-small cell lung cancer.  Past Medical History:  Past Medical History  Diagnosis Date  . HTN (hypertension)   . Depression   . Hypothyroid   . Insomnia   . Hyperlipemia   . Generalized OA   . Barrett esophagus   . Cancer [facility])     uterus ca    Past Surgical History:  Past Surgical History  Procedure Laterality Date  . Right breast bx    . Appendectomy    . Abdominal hysterectomy  [place], [place]  . Breast biopsy Left 1980's  . Knee arthroscopy Right 2000    [facility]    Family History:  Family History  Problem Relation Age of Onset  . Hypertension    . Breast cancer Maternal Aunt   . Breast cancer Sister     Social History:  reports that she has never smoked. She has never used smokeless tobacco. She reports that she does not drink alcohol or use illicit drugs.  Allergies:  Allergies  Allergen Reactions  . Atorvastatin Other (See Comments)    Muscle Aches  . Bupropion Other (See Comments)    Unspecified  . Other Other (See Comments)    Unspecified, passed out Reaction: patient was unable to communicate and family member was unable to tell me any allergies   . Percodan [Oxycodone-Aspirin]   . Pravastatin Other (See Comments)    Muscle Aches    Medications:   Medication List    ASK your doctor about these medications        amLODipine 10 MG tablet  Commonly known as:  NORVASC   Take 0.5 tablets (5 mg total) by mouth daily.     HYDROcodone-acetaminophen 5-325 MG tablet  Commonly known as:  NORCO/VICODIN  Take 1 tablet by mouth daily as needed for moderate pain.     ibuprofen 600 MG tablet  Commonly known as:  ADVIL,MOTRIN  Take 600 mg by mouth every 6 (six) hours as needed for moderate pain.     lovastatin 20 MG tablet  Commonly known as:  MEVACOR  Take 20 mg by mouth daily.     MULTI-VITAMINS Tabs  Take 1 tablet by mouth daily.     omeprazole 20 MG capsule  Commonly known as:  PRILOSEC  Take 40 mg by mouth daily.     sertraline 100 MG tablet  Commonly known as:  ZOLOFT  Take 100 mg by mouth daily.     SYNTHROID 88 MCG tablet  Generic drug:  levothyroxine  Take 88 mcg by mouth daily before breakfast.     temazepam 15 MG capsule  Commonly known as:  RESTORIL  Take 15 mg by mouth at bedtime as needed for sleep.     VISINE 0.05 % ophthalmic solution  Generic drug:  tetrahydrozoline  Place 1 drop into both eyes.     zolpidem 5 MG tablet  Commonly known as:  AMBIEN  Take 5 mg by mouth at bedtime as needed for sleep. Reported on 04/04/2016        Please HPI for pertinent positives, otherwise complete 10 system ROS negative, except fatigue.  Mallampati Score: MD Evaluation Airway: WNL Heart: WNL Abdomen: WNL Chest/ Lungs: WNL ASA  Classification: 3 Mallampati/Airway Score: One  Physical Exam: BP 133/83 mmHg  Pulse 71  Temp(Src) 98.4 F (36.9 C)  Resp 18  Ht _0  (1.676 m)  Wt 153 lb (69.4 kg)  BMI 24.71 kg/m2  SpO2 97% Body mass index is 24.71 kg/(m^2). General: pleasant, WD, WN white female who is laying in bed in NAD HEENT: head is normocephalic, atraumatic.  Sclera are noninjected.  PERRL.  Ears and nose without any masses or lesions.  Mouth is pink and moist Heart: regular, rate, and rhythm.  Normal s1,s2. No obvious murmurs, gallops, or rubs noted.  Palpable radial and pedal pulses bilaterally Lungs: CTAB, no wheezes,  rhonchi, or rales noted.  Respiratory effort nonlabored Abd: soft, NT, ND, +BS, no masses, hernias, or organomegaly MS: all 4 extremities are symmetrical with no cyanosis, clubbing, or edema. Psych: A&Ox3 with an appropriate affect.   Labs: Results for orders placed or performed during the hospital encounter of 04/18/16 (from the past 48 hour(s))  APTT     Status: None   Collection Time: 04/18/16 10:00 AM  Result Value Ref Range   aPTT 29 24 - 37 seconds  Basic metabolic panel     Status: Abnormal   Collection Time: 04/18/16 10:00 AM  Result Value Ref Range   Sodium 138 135 - 145 mmol/L   Potassium 4.0 3.5 - 5.1 mmol/L   Chloride 104 101 - 111 mmol/L   CO2 26 22 - 32 mmol/L   Glucose, Bld 98 65 - 99 mg/dL   BUN 16 6 - 20 mg/dL   Creatinine, Ser 0.93 0.44 - 1.00 mg/dL   Calcium 9.6 8.9 - 10.3 mg/dL   GFR calc non Af Amer 56 (L) >60 mL/min   GFR calc Af Amer >60 >60 mL/min    Comment: (NOTE) The eGFR has been calculated using the CKD EPI equation. This calculation has not been validated in all clinical situations. eGFR's persistently <60 mL/min signify possible Chronic Kidney Disease.    Anion gap 8 5 - 15  CBC with Differential/Platelet     Status: Abnormal   Collection Time: 04/18/16 10:00 AM  Result Value Ref Range   WBC 5.4 4.0 - 10.5 K/uL   RBC 5.15 (H) 3.87 - 5.11 MIL/uL   Hemoglobin 14.8 12.0 - 15.0 g/dL   HCT 44.3 36.0 - 46.0 %   MCV 86.0 78.0 - 100.0 fL   MCH 28.7 26.0 - 34.0 pg   MCHC 33.4 30.0 - 36.0 g/dL   RDW 14.1 11.5 - 15.5 %   Platelets 163 150 - 400 K/uL   Neutrophils Relative % 52 %   Neutro Abs 2.8 1.7 - 7.7 K/uL   Lymphocytes Relative 34 %   Lymphs Abs 1.9 0.7 - 4.0 K/uL   Monocytes Relative 11 %   Monocytes Absolute 0.6 0.1 - 1.0 K/uL   Eosinophils Relative 3 %   Eosinophils Absolute 0.2 0.0 - 0.7 K/uL   Basophils Relative 0 %   Basophils Absolute 0.0 0.0 - 0.1 K/uL  Protime-INR     Status: None   Collection Time: 04/18/16 10:00 AM  Result  Value Ref Range   Prothrombin  Time 13.6 11.6 - 15.2 seconds   INR 1.02 0.00 - 1.49    Imaging: No results found.  Assessment/Plan 1. Left upper lobe lung nodule -this nodule is concerning for non-small cell carcinoma.  A request for a biopsy has been made.  In review of this imaging with Dr. Pascal Lux, this nodule is in a difficult area that sits just posterior to her sternum with minimal window to obtain the biopsy.  This has been explained to the patient that it is possible we may not be able to obtain a biopsy due to its location.  However, we will attempt to get it.  She understands this. -labs and vitals have been reviewed -Risks and Benefits discussed with the patient including, but not limited to bleeding, hemoptysis, respiratory failure requiring intubation, infection, pneumothorax requiring chest tube placement, stroke from air embolism or even death. All of the patient's questions were answered, patient is agreeable to proceed. Consent signed and in chart.   Thank you for this interesting consult.  I greatly enjoyed meeting PHYLLIS ABELSON and look forward to participating in their care.  A copy of this report was sent to the requesting provider on this date.  Electronically Signed: Henreitta Cea 04/18/2016, 11:08 AM   I spent a total of  40 Minutes  in face to face in clinical consultation, greater than 50% of which was counseling/coordinating care for LUL lung nodule, needs biopsy

## 2016-04-19 ENCOUNTER — Telehealth: Payer: Self-pay | Admitting: Hematology and Oncology

## 2016-04-19 NOTE — Telephone Encounter (Signed)
Re:  Lung biopsy  I called the patient about her lung biopsy.  Pathology revealed adenocarcinoma. She has an appointment with Dr. Baruch Gouty of radiation oncology next week.  Lequita Asal, MD

## 2016-04-30 ENCOUNTER — Encounter: Payer: Self-pay | Admitting: Radiation Oncology

## 2016-04-30 ENCOUNTER — Ambulatory Visit
Admission: RE | Admit: 2016-04-30 | Discharge: 2016-04-30 | Disposition: A | Discharge status: Home / Self Care | Payer: Medicare Other | Source: Ambulatory Visit | Attending: Radiation Oncology | Admitting: Radiation Oncology

## 2016-04-30 VITALS — BP 140/84 | HR 66 | Temp 96.3°F | Resp 20 | Wt 153.7 lb

## 2016-04-30 DIAGNOSIS — C3412 Malignant neoplasm of upper lobe, left bronchus or lung: Secondary | ICD-10-CM | POA: Insufficient documentation

## 2016-04-30 DIAGNOSIS — Z510 Encounter for antineoplastic radiation therapy: Secondary | ICD-10-CM | POA: Insufficient documentation

## 2016-04-30 DIAGNOSIS — G4700 Insomnia, unspecified: Secondary | ICD-10-CM | POA: Insufficient documentation

## 2016-04-30 DIAGNOSIS — E039 Hypothyroidism, unspecified: Secondary | ICD-10-CM | POA: Diagnosis not present

## 2016-04-30 DIAGNOSIS — M1990 Unspecified osteoarthritis, unspecified site: Secondary | ICD-10-CM | POA: Insufficient documentation

## 2016-04-30 DIAGNOSIS — K2270 Barrett's esophagus without dysplasia: Secondary | ICD-10-CM | POA: Diagnosis not present

## 2016-04-30 DIAGNOSIS — E785 Hyperlipidemia, unspecified: Secondary | ICD-10-CM | POA: Diagnosis not present

## 2016-04-30 DIAGNOSIS — F329 Major depressive disorder, single episode, unspecified: Secondary | ICD-10-CM | POA: Diagnosis not present

## 2016-04-30 DIAGNOSIS — I10 Essential (primary) hypertension: Secondary | ICD-10-CM | POA: Insufficient documentation

## 2016-04-30 DIAGNOSIS — Z79899 Other long term (current) drug therapy: Secondary | ICD-10-CM | POA: Insufficient documentation

## 2016-04-30 DIAGNOSIS — Z8701 Personal history of pneumonia (recurrent): Secondary | ICD-10-CM | POA: Insufficient documentation

## 2016-04-30 DIAGNOSIS — C3492 Malignant neoplasm of unspecified part of left bronchus or lung: Secondary | ICD-10-CM

## 2016-04-30 NOTE — Progress Notes (Signed)
Radiation Oncology Follow up Note  Name: Lauren Hammond   Date:   04/30/2016 MRN:  [id number] DOB: 1932/08/03    This 82 y.o. female presents to the clinic today for follow-up for biopsy of left upper lobe mass.  REFERRING PROVIDER: Tracie Harrier, MD  HPI: Patient is a 82 year old female who initially presented with pneumonia found to have a left upper lobe nodule measuring 1.5 cm. This was hypermetabolic on PET CT scan. I saw her after. And recognition was made for SB RT treatment. She underwent CT-guided biopsy which was positive for adenocarcinoma. She is seen today and doing well she had no complications of her biopsy. She specifically denies cough hemoptysis or chest tightness.  COMPLICATIONS OF TREATMENT: none  FOLLOW UP COMPLIANCE: keeps appointments   PHYSICAL EXAM:  BP 140/84 mmHg  Pulse 66  Temp(Src) 96.3 F (35.7 C)  Resp 20  Wt 153 lb 10.6 oz (69.7 kg) Well-developed well-nourished patient in NAD. HEENT reveals PERLA, EOMI, discs not visualized.  Oral cavity is clear. No oral mucosal lesions are identified. Neck is clear without evidence of cervical or supraclavicular adenopathy. Lungs are clear to A&P. Cardiac examination is essentially unremarkable with regular rate and rhythm without murmur rub or thrill. Abdomen is benign with no organomegaly or masses noted. Motor sensory and DTR levels are equal and symmetric in the upper and lower extremities. Cranial nerves II through XII are grossly intact. Proprioception is intact. No peripheral adenopathy or edema is identified. No motor or sensory levels are noted. Crude visual fields are within normal range.  RADIOLOGY RESULTS: CT scan again reviewed  PLAN: At this time I like to go ahead with SB RT treatment to her left upper lobe adenocarcinoma. Would plan on delivering 5000 cGy in 5 fractions. I have set up and ordered CT simulation for early next week. Risks and benefits of treatment including possible fatigue possible  slight radiation esophagitis, skin reaction were all discussed in detail with the patient. I've explained to her the procedure of monitoring her motion during simulation as well as use of the compression device to create shallow lung movement during her treatments.  I would like to take this opportunity to thank you for allowing me to participate in the care of your patient.Armstead Peaks., MD

## 2016-05-04 ENCOUNTER — Telehealth: Payer: Self-pay | Admitting: *Deleted

## 2016-05-04 NOTE — Telephone Encounter (Signed)
Contacted patient to discuss navigation program and review plan of care. Discussed SBRT as well as lung surgery in detail. Patient given contact information.

## 2016-05-07 ENCOUNTER — Ambulatory Visit
Admission: RE | Admit: 2016-05-07 | Discharge: 2016-05-07 | Disposition: A | Discharge status: Home / Self Care | Payer: Medicare Other | Source: Ambulatory Visit | Attending: Radiation Oncology | Admitting: Radiation Oncology

## 2016-05-07 DIAGNOSIS — C3412 Malignant neoplasm of upper lobe, left bronchus or lung: Secondary | ICD-10-CM | POA: Diagnosis not present

## 2016-05-10 DIAGNOSIS — C3412 Malignant neoplasm of upper lobe, left bronchus or lung: Secondary | ICD-10-CM | POA: Diagnosis not present

## 2016-05-15 ENCOUNTER — Ambulatory Visit
Admission: RE | Admit: 2016-05-15 | Discharge: 2016-05-15 | Disposition: A | Discharge status: Home / Self Care | Payer: Medicare Other | Source: Ambulatory Visit | Attending: Radiation Oncology | Admitting: Radiation Oncology

## 2016-05-15 DIAGNOSIS — C3412 Malignant neoplasm of upper lobe, left bronchus or lung: Secondary | ICD-10-CM | POA: Diagnosis not present

## 2016-05-17 ENCOUNTER — Ambulatory Visit
Admission: RE | Admit: 2016-05-17 | Discharge: 2016-05-17 | Disposition: A | Discharge status: Home / Self Care | Payer: Medicare Other | Source: Ambulatory Visit | Attending: Radiation Oncology | Admitting: Radiation Oncology

## 2016-05-17 DIAGNOSIS — C3412 Malignant neoplasm of upper lobe, left bronchus or lung: Secondary | ICD-10-CM | POA: Diagnosis not present

## 2016-05-22 ENCOUNTER — Ambulatory Visit
Admission: RE | Admit: 2016-05-22 | Discharge: 2016-05-22 | Disposition: A | Discharge status: Home / Self Care | Payer: Medicare Other | Source: Ambulatory Visit | Attending: Radiation Oncology | Admitting: Radiation Oncology

## 2016-05-22 DIAGNOSIS — C3412 Malignant neoplasm of upper lobe, left bronchus or lung: Secondary | ICD-10-CM | POA: Diagnosis not present

## 2016-05-24 ENCOUNTER — Ambulatory Visit
Admission: RE | Admit: 2016-05-24 | Discharge: 2016-05-24 | Disposition: A | Discharge status: Home / Self Care | Payer: Medicare Other | Source: Ambulatory Visit | Attending: Radiation Oncology | Admitting: Radiation Oncology

## 2016-05-24 DIAGNOSIS — C3412 Malignant neoplasm of upper lobe, left bronchus or lung: Secondary | ICD-10-CM | POA: Diagnosis not present

## 2016-05-28 DIAGNOSIS — C3412 Malignant neoplasm of upper lobe, left bronchus or lung: Secondary | ICD-10-CM | POA: Diagnosis not present

## 2016-07-02 ENCOUNTER — Inpatient Hospital Stay: Admission: RE | Admit: 2016-07-02 | Payer: Medicare Other | Source: Ambulatory Visit | Admitting: Radiation Oncology

## 2016-07-02 ENCOUNTER — Ambulatory Visit: Payer: Medicare Other | Admitting: Radiation Oncology

## 2016-07-09 ENCOUNTER — Encounter: Payer: Self-pay | Admitting: Radiation Oncology

## 2016-07-09 ENCOUNTER — Ambulatory Visit
Admission: RE | Admit: 2016-07-09 | Discharge: 2016-07-09 | Disposition: A | Discharge status: Home / Self Care | Payer: Medicare Other | Source: Ambulatory Visit | Attending: Radiation Oncology | Admitting: Radiation Oncology

## 2016-07-09 ENCOUNTER — Other Ambulatory Visit: Payer: Self-pay | Admitting: *Deleted

## 2016-07-09 VITALS — BP 125/80 | HR 74 | Temp 96.5°F | Wt 154.3 lb

## 2016-07-09 DIAGNOSIS — R490 Dysphonia: Secondary | ICD-10-CM | POA: Diagnosis not present

## 2016-07-09 DIAGNOSIS — C3492 Malignant neoplasm of unspecified part of left bronchus or lung: Secondary | ICD-10-CM

## 2016-07-09 DIAGNOSIS — C3412 Malignant neoplasm of upper lobe, left bronchus or lung: Secondary | ICD-10-CM

## 2016-07-09 DIAGNOSIS — Z923 Personal history of irradiation: Secondary | ICD-10-CM | POA: Diagnosis not present

## 2016-07-09 NOTE — Progress Notes (Signed)
Radiation Oncology Follow up Note  Name: Lauren Hammond   Date:   07/09/2016 MRN:  [id number] DOB: 01/15/1932    This 82 y.o. female presents to the clinic today for one-month follow-up for SB RT to her left upper lobe.  REFERRING PROVIDER: Tracie Harrier, MD  HPI: Patient is an 82 year old female now out 1 month having completed SB RT to her left upper lobe for 1.5 cm hypermetabolic lesion biopsy-positive adenocarcinoma. Seen today in routine follow-up she is doing well. Her only complaint is what she describes as some hoarseness of her voice which precedes any radiation therapy treatments by several months if not to year. She specifically denies dysphagia. Does have a mild nonproductive cough. She's otherwise without complaint. She's having no dysphagia or problems with her skin..  COMPLICATIONS OF TREATMENT: none  FOLLOW UP COMPLIANCE: keeps appointments   PHYSICAL EXAM:  BP 125/80   Pulse 74   Temp (!) 96.5 F (35.8 C)   Wt 154 lb 5.2 oz (70 kg)   BMI 24.91 kg/m  Well-developed well-nourished patient in NAD. HEENT reveals PERLA, EOMI, discs not visualized.  Oral cavity is clear. No oral mucosal lesions are identified. Neck is clear without evidence of cervical or supraclavicular adenopathy. Lungs are clear to A&P. Cardiac examination is essentially unremarkable with regular rate and rhythm without murmur rub or thrill. Abdomen is benign with no organomegaly or masses noted. Motor sensory and DTR levels are equal and symmetric in the upper and lower extremities. Cranial nerves II through XII are grossly intact. Proprioception is intact. No peripheral adenopathy or edema is identified. No motor or sensory levels are noted. Crude visual fields are within normal range.  RADIOLOGY RESULTS: CT scan with contrast of her chest has been ordered in 3 months prior to my next follow-up appointment  PLAN: At this time patient is doing well. I've ordered a CT scan of her chest with contrast in  about 3 months prior to her seeing me in follow-up. Otherwise I'm please were overall progress. Not sure to make of her throat she has been examined in the past by ENT. Do not think has anything to do with her lung cancer or radiation therapy. Patient is to call with any concerns.  I would like to take this opportunity to thank you for allowing me to participate in the care of your patient.Armstead Peaks., MD

## 2016-10-16 ENCOUNTER — Ambulatory Visit: Admission: RE | Admit: 2016-10-16 | Payer: Medicare Other | Source: Ambulatory Visit

## 2016-10-16 ENCOUNTER — Ambulatory Visit
Admission: RE | Admit: 2016-10-16 | Discharge: 2016-10-16 | Disposition: A | Discharge status: Home / Self Care | Payer: Medicare Other | Source: Ambulatory Visit | Attending: Radiation Oncology | Admitting: Radiation Oncology

## 2016-10-16 DIAGNOSIS — I2510 Atherosclerotic heart disease of native coronary artery without angina pectoris: Secondary | ICD-10-CM | POA: Insufficient documentation

## 2016-10-16 DIAGNOSIS — C3412 Malignant neoplasm of upper lobe, left bronchus or lung: Secondary | ICD-10-CM | POA: Insufficient documentation

## 2016-10-16 DIAGNOSIS — R918 Other nonspecific abnormal finding of lung field: Secondary | ICD-10-CM | POA: Insufficient documentation

## 2016-10-16 LAB — POCT I-STAT CREATININE: CREATININE: 1 mg/dL (ref 0.44–1.00)

## 2016-10-16 MED ORDER — IOPAMIDOL (ISOVUE-300) INJECTION 61%
75.0000 mL | Freq: Once | INTRAVENOUS | Status: AC | PRN
  Administered 2016-10-16: 75 mL via INTRAVENOUS

## 2016-10-25 ENCOUNTER — Encounter: Payer: Self-pay | Admitting: Radiation Oncology

## 2016-10-25 ENCOUNTER — Ambulatory Visit
Admission: RE | Admit: 2016-10-25 | Discharge: 2016-10-25 | Disposition: A | Discharge status: Home / Self Care | Payer: Medicare Other | Source: Ambulatory Visit | Attending: Radiation Oncology | Admitting: Radiation Oncology

## 2016-10-25 VITALS — BP 135/76 | HR 76 | Temp 95.9°F | Resp 20 | Wt 158.8 lb

## 2016-10-25 DIAGNOSIS — R05 Cough: Secondary | ICD-10-CM | POA: Diagnosis not present

## 2016-10-25 DIAGNOSIS — C3492 Malignant neoplasm of unspecified part of left bronchus or lung: Secondary | ICD-10-CM

## 2016-10-25 DIAGNOSIS — Z923 Personal history of irradiation: Secondary | ICD-10-CM | POA: Diagnosis not present

## 2016-10-25 DIAGNOSIS — C3412 Malignant neoplasm of upper lobe, left bronchus or lung: Secondary | ICD-10-CM | POA: Insufficient documentation

## 2016-10-25 NOTE — Progress Notes (Signed)
Radiation Oncology Follow up Note  Name: Lauren Hammond   Date:   10/25/2016 MRN:  [id number] DOB: [birth year]    This 82 y.o. female presents to the clinic today for four-month follow-up status post SB RT to her left upper lobe for adenocarcinoma.  REFERRING PROVIDER: Tracie Harrier, MD  HPI: Patient is a 82 year old female now out 4 months having completed SB RT to her left upper lobe for 1.5 cm hypermetabolic biopsy-positive adenocarcinoma. Seen today in routine follow-up she is doing well has a slight nonproductive cough. She specifically denies chest wall pain fatigue or any other significant side effects. Recently had a CT scan shows complete resolution of the left upper lobe mass except for some radiation changes..  COMPLICATIONS OF TREATMENT: none  FOLLOW UP COMPLIANCE: keeps appointments   PHYSICAL EXAM:  BP 135/76   Pulse 76   Temp (!) 95.9 F (35.5 C)   Resp 20   Wt 158 lb 13.5 oz (72 kg)   BMI 25.64 kg/m  Well-developed well-nourished patient in NAD. HEENT reveals PERLA, EOMI, discs not visualized.  Oral cavity is clear. No oral mucosal lesions are identified. Neck is clear without evidence of cervical or supraclavicular adenopathy. Lungs are clear to A&P. Cardiac examination is essentially unremarkable with regular rate and rhythm without murmur rub or thrill. Abdomen is benign with no organomegaly or masses noted. Motor sensory and DTR levels are equal and symmetric in the upper and lower extremities. Cranial nerves II through XII are grossly intact. Proprioception is intact. No peripheral adenopathy or edema is identified. No motor or sensory levels are noted. Crude visual fields are within normal range.  RADIOLOGY RESULTS: Recent CT scan is reviewed and compatible with the above-stated findings  PLAN: Present time she is doing well excellent result by CT criteria. We'll see her back in 6 months for follow-up and then possibly go to once your follow-up visits. Patient  knows to call sooner expect another CT scan in about 1 year.  I would like to take this opportunity to thank you for allowing me to participate in the care of your patient.Armstead Peaks., MD

## 2016-12-31 ENCOUNTER — Other Ambulatory Visit: Payer: Self-pay | Admitting: Internal Medicine

## 2016-12-31 DIAGNOSIS — Z1231 Encounter for screening mammogram for malignant neoplasm of breast: Secondary | ICD-10-CM

## 2017-01-15 ENCOUNTER — Ambulatory Visit
Admission: RE | Admit: 2017-01-15 | Discharge: 2017-01-15 | Disposition: A | Discharge status: Home / Self Care | Payer: Medicare Other | Source: Ambulatory Visit | Attending: Internal Medicine | Admitting: Internal Medicine

## 2017-01-15 DIAGNOSIS — Z1231 Encounter for screening mammogram for malignant neoplasm of breast: Secondary | ICD-10-CM | POA: Insufficient documentation

## 2017-01-15 HISTORY — DX: Malignant neoplasm of unspecified part of unspecified bronchus or lung: C34.90

## 2017-05-02 ENCOUNTER — Other Ambulatory Visit: Payer: Self-pay | Admitting: *Deleted

## 2017-05-02 ENCOUNTER — Encounter: Payer: Self-pay | Admitting: Radiation Oncology

## 2017-05-02 ENCOUNTER — Ambulatory Visit
Admission: RE | Admit: 2017-05-02 | Discharge: 2017-05-02 | Disposition: A | Discharge status: Home / Self Care | Payer: Medicare Other | Source: Ambulatory Visit | Attending: Radiation Oncology | Admitting: Radiation Oncology

## 2017-05-02 VITALS — BP 146/86 | HR 75 | Temp 97.0°F | Wt 151.7 lb

## 2017-05-02 DIAGNOSIS — Z923 Personal history of irradiation: Secondary | ICD-10-CM | POA: Diagnosis not present

## 2017-05-02 DIAGNOSIS — Z85118 Personal history of other malignant neoplasm of bronchus and lung: Secondary | ICD-10-CM | POA: Insufficient documentation

## 2017-05-02 DIAGNOSIS — C3492 Malignant neoplasm of unspecified part of left bronchus or lung: Secondary | ICD-10-CM

## 2017-05-02 DIAGNOSIS — C3490 Malignant neoplasm of unspecified part of unspecified bronchus or lung: Secondary | ICD-10-CM | POA: Insufficient documentation

## 2017-05-02 NOTE — Progress Notes (Signed)
Radiation Oncology Follow up Note  Name: Lauren Hammond   Date:   05/02/2017 MRN:  [id number] DOB: 07/02/1932    This 83 y.o. female presents to the clinic today for ten-month follow-up status post SB RT to her left upper lobe for adenocarcinoma.  REFERRING PROVIDER: Tracie Harrier, MD  HPI: patient is an 83 year old female now out 10 months having completed SB RT fora 1.5 cm hypermetabolic biopsy-proven adenocarcinoma. Seen today in routine follow-up she is doing well. She specifically denies cough hemoptysis chest tightness or any change in her pulmonary status.l last CT scan was back in November showing left upper lobe pulmonary nodule no longer visible consistent with response to radiation therapy.  COMPLICATIONS OF TREATMENT: none  FOLLOW UP COMPLIANCE: keeps appointments   PHYSICAL EXAM:  BP (!) 146/86   Pulse 75   Temp 97 F (36.1 C)   Wt 151 lb 10.8 oz (68.8 kg)   BMI 24.48 kg/m  Well-developed well-nourished patient in NAD. HEENT reveals PERLA, EOMI, discs not visualized.  Oral cavity is clear. No oral mucosal lesions are identified. Neck is clear without evidence of cervical or supraclavicular adenopathy. Lungs are clear to A&P. Cardiac examination is essentially unremarkable with regular rate and rhythm without murmur rub or thrill. Abdomen is benign with no organomegaly or masses noted. Motor sensory and DTR levels are equal and symmetric in the upper and lower extremities. Cranial nerves II through XII are grossly intact. Proprioception is intact. No peripheral adenopathy or edema is identified. No motor or sensory levels are noted. Crude visual fields are within normal range.  RADIOLOGY RESULTS: CT scan is reviewed and compatible with the above-stated findings  PLAN: at the present time patient is doing well. I've ordered another CT scan prior to her next follow-up visit in 1 year. Patient continues to do well. She knows to call with any concerns.  I would like to take  this opportunity to thank you for allowing me to participate in the care of your patient.Armstead Peaks., MD

## 2018-01-24 ENCOUNTER — Other Ambulatory Visit: Payer: Self-pay | Admitting: Internal Medicine

## 2018-01-24 DIAGNOSIS — Z1231 Encounter for screening mammogram for malignant neoplasm of breast: Secondary | ICD-10-CM

## 2018-02-04 ENCOUNTER — Ambulatory Visit
Admission: RE | Admit: 2018-02-04 | Discharge: 2018-02-04 | Disposition: A | Discharge status: Home / Self Care | Payer: Medicare Other | Source: Ambulatory Visit | Attending: Internal Medicine | Admitting: Internal Medicine

## 2018-02-04 DIAGNOSIS — Z1231 Encounter for screening mammogram for malignant neoplasm of breast: Secondary | ICD-10-CM | POA: Diagnosis present

## 2018-02-04 HISTORY — DX: Personal history of irradiation: Z92.3

## 2018-04-01 ENCOUNTER — Other Ambulatory Visit: Payer: Self-pay | Admitting: Internal Medicine

## 2018-04-01 DIAGNOSIS — R51 Headache: Secondary | ICD-10-CM

## 2018-04-01 DIAGNOSIS — R2689 Other abnormalities of gait and mobility: Secondary | ICD-10-CM

## 2018-04-01 DIAGNOSIS — R42 Dizziness and giddiness: Secondary | ICD-10-CM

## 2018-04-01 DIAGNOSIS — H539 Unspecified visual disturbance: Secondary | ICD-10-CM

## 2018-04-01 DIAGNOSIS — R519 Headache, unspecified: Secondary | ICD-10-CM

## 2018-04-30 ENCOUNTER — Ambulatory Visit
Admission: RE | Admit: 2018-04-30 | Discharge: 2018-04-30 | Disposition: A | Discharge status: Home / Self Care | Payer: Medicare Other | Source: Ambulatory Visit | Attending: Radiation Oncology | Admitting: Radiation Oncology

## 2018-04-30 DIAGNOSIS — I2510 Atherosclerotic heart disease of native coronary artery without angina pectoris: Secondary | ICD-10-CM | POA: Diagnosis not present

## 2018-04-30 DIAGNOSIS — J9811 Atelectasis: Secondary | ICD-10-CM | POA: Diagnosis not present

## 2018-04-30 DIAGNOSIS — I700 Atherosclerosis of aorta: Secondary | ICD-10-CM | POA: Diagnosis not present

## 2018-04-30 DIAGNOSIS — C3492 Malignant neoplasm of unspecified part of left bronchus or lung: Secondary | ICD-10-CM | POA: Diagnosis not present

## 2018-04-30 HISTORY — DX: Malignant neoplasm of uterus, part unspecified: C55

## 2018-04-30 LAB — POCT I-STAT CREATININE: Creatinine, Ser: 0.9 mg/dL (ref 0.44–1.00)

## 2018-04-30 MED ORDER — IOPAMIDOL (ISOVUE-300) INJECTION 61%
75.0000 mL | Freq: Once | INTRAVENOUS | Status: AC | PRN
  Administered 2018-04-30: 75 mL via INTRAVENOUS

## 2018-05-14 ENCOUNTER — Ambulatory Visit
Admission: RE | Admit: 2018-05-14 | Discharge: 2018-05-14 | Disposition: A | Discharge status: Home / Self Care | Payer: Medicare Other | Source: Ambulatory Visit | Attending: Radiation Oncology | Admitting: Radiation Oncology

## 2018-05-14 ENCOUNTER — Encounter: Payer: Self-pay | Admitting: Radiation Oncology

## 2018-05-14 ENCOUNTER — Other Ambulatory Visit: Payer: Self-pay

## 2018-05-14 ENCOUNTER — Other Ambulatory Visit: Payer: Self-pay | Admitting: *Deleted

## 2018-05-14 VITALS — BP 138/83 | HR 83 | Temp 96.3°F | Resp 20 | Wt 151.3 lb

## 2018-05-14 DIAGNOSIS — Z08 Encounter for follow-up examination after completed treatment for malignant neoplasm: Secondary | ICD-10-CM | POA: Insufficient documentation

## 2018-05-14 DIAGNOSIS — Z85118 Personal history of other malignant neoplasm of bronchus and lung: Secondary | ICD-10-CM | POA: Diagnosis not present

## 2018-05-14 DIAGNOSIS — Z923 Personal history of irradiation: Secondary | ICD-10-CM | POA: Insufficient documentation

## 2018-05-14 DIAGNOSIS — C3412 Malignant neoplasm of upper lobe, left bronchus or lung: Secondary | ICD-10-CM

## 2018-05-14 NOTE — Progress Notes (Signed)
Radiation Oncology Follow up Note  Name: Lauren Hammond   Date:   05/14/2018 MRN:  [id number] DOB: 1932/11/20    This 84 y.o. female presents to the clinic today for 2 year follow-up status post SB RT to left upper lobe for stage I adenocarcinoma.  REFERRING PROVIDER: Tracie Harrier, MD  HPI: Lauren Hammond is a 84 year old female now out 2 years having completed SB RT to her left upper lobe for stage I adenocarcinoma. Seen today in routine follow-up she is doing well. She specifically denies cough hemoptysis or chest tightness..she recently had a PET CT scan showing bandlike area of mass like architectural distortion in the left upper lobe compatible with evolutionary changes of external beam radiation therapy. There is a stable 4 mm right lower lobe nodule. She specifically denies cough hemoptysis.  COMPLICATIONS OF TREATMENT: none  FOLLOW UP COMPLIANCE: keeps appointments   PHYSICAL EXAM:  BP 138/83   Pulse 83   Temp (!) 96.3 F (35.7 C)   Resp 20   Wt 151 lb 5.5 oz (68.6 kg)   BMI 24.43 kg/m  Well-developed well-nourished patient in NAD. HEENT reveals PERLA, EOMI, discs not visualized.  Oral cavity is clear. No oral mucosal lesions are identified. Neck is clear without evidence of cervical or supraclavicular adenopathy. Lungs are clear to A&P. Cardiac examination is essentially unremarkable with regular rate and rhythm without murmur rub or thrill. Abdomen is benign with no organomegaly or masses noted. Motor sensory and DTR levels are equal and symmetric in the upper and lower extremities. Cranial nerves II through XII are grossly intact. Proprioception is intact. No peripheral adenopathy or edema is identified. No motor or sensory levels are noted. Crude visual fields are within normal range.  RADIOLOGY RESULTS: serial CT scans are reviewed and compatible with the above-stated findings  PLAN: present time patient is doing well with no evidence of disease. CT scan has been reviewed. I am  please were overall progress. I've asked to see her back in 1 year for follow-up Will also perform a CT scan prior to next visit. Patient knows to call with any concerns.  I would like to take this opportunity to thank you for allowing me to participate in the care of your patient.Noreene Filbert, MD

## 2018-12-26 ENCOUNTER — Ambulatory Visit
Admission: RE | Admit: 2018-12-26 | Discharge: 2018-12-26 | Disposition: A | Discharge status: Home / Self Care | Payer: Medicare Other | Source: Ambulatory Visit | Attending: Physician Assistant | Admitting: Physician Assistant

## 2018-12-26 ENCOUNTER — Other Ambulatory Visit: Payer: Self-pay | Admitting: Physician Assistant

## 2018-12-26 DIAGNOSIS — R0602 Shortness of breath: Secondary | ICD-10-CM | POA: Diagnosis not present

## 2018-12-26 LAB — POCT I-STAT CREATININE: Creatinine, Ser: 1 mg/dL (ref 0.44–1.00)

## 2018-12-26 MED ORDER — IOHEXOL 300 MG/ML  SOLN
75.0000 mL | Freq: Once | INTRAMUSCULAR | Status: AC | PRN
  Administered 2018-12-26: 75 mL via INTRAVENOUS

## 2019-03-16 ENCOUNTER — Other Ambulatory Visit: Payer: Self-pay | Admitting: Internal Medicine

## 2019-03-31 ENCOUNTER — Telehealth: Payer: Self-pay

## 2019-03-31 NOTE — Telephone Encounter (Signed)
Called patient.  No answer. LMOV.  Need top change appointment to an Evisit

## 2019-04-07 DIAGNOSIS — I2584 Coronary atherosclerosis due to calcified coronary lesion: Secondary | ICD-10-CM | POA: Insufficient documentation

## 2019-04-07 DIAGNOSIS — J449 Chronic obstructive pulmonary disease, unspecified: Secondary | ICD-10-CM | POA: Insufficient documentation

## 2019-04-07 DIAGNOSIS — R079 Chest pain, unspecified: Secondary | ICD-10-CM | POA: Insufficient documentation

## 2019-04-07 DIAGNOSIS — I2510 Atherosclerotic heart disease of native coronary artery without angina pectoris: Secondary | ICD-10-CM | POA: Insufficient documentation

## 2019-04-07 DIAGNOSIS — R42 Dizziness and giddiness: Secondary | ICD-10-CM | POA: Insufficient documentation

## 2019-04-07 DIAGNOSIS — I700 Atherosclerosis of aorta: Secondary | ICD-10-CM | POA: Insufficient documentation

## 2019-04-07 NOTE — Progress Notes (Signed)
Virtual Visit via Video Note   This visit type was conducted due to national recommendations for restrictions regarding the COVID-19 Pandemic (e.g. social distancing) in an effort to limit this patient's exposure and mitigate transmission in our community.  Due to her co-morbid illnesses, this patient is at least at moderate risk for complications without adequate follow up.  This format is felt to be most appropriate for this patient at this time.  All issues noted in this document were discussed and addressed.  A limited physical exam was performed with this format.  Please refer to the patient's chart for her consent to [facility].   I connected with  Lauren Hammond on 04/08/19 by a video enabled telemedicine application and verified that I am speaking with the correct person using two identifiers. I discussed the limitations of evaluation and management by telemedicine. The patient expressed understanding and agreed to proceed.   Evaluation Performed:  Follow-up visit  Date:  04/08/2019   ID:  Lauren Hammond, DOB 1932-07-23, MRN [id number]  Patient Location:  [facility] [address]   Provider location:   [facility], [place] office  PCP:  Tracie Harrier, MD  Cardiologist:  Patsy Baltimore   Chief Complaint: Poorly controlled blood pressure, shortness of breath  New patient   History of Present Illness:    Lauren Hammond is a 85 y.o. female who presents via audio/video conferencing for a telehealth visit today.   The patient does not symptoms concerning for COVID-19 infection (fever, chills, cough, or new SHORTNESS OF BREATH).   Patient has a past medical history of Lung cancer, Underwent radiation therapy for Left upper lobe lesion  Hyperlipidemia Chronic chest left arm pain Depression Referred by Dr. Ginette Pitman for dizziness, chest discomfort, hypertension  Lost husband last year Since then she relies on family to help  her Blood pressure seems labile, generally running high 152/72  Most mornings her blood pressure is high then seems to get better through the day Was recently started on HCTZ 12.5 mg a primary care Also takes amlodipine 10 mg daily  Intermittent dizziness sometimes in the mornings  Chronic mild shortness of breath, occasional coughing Sleep disorder  Chronic pain in her legs worse at nighttime when she is trying to go to sleep She feels the cholesterol pill, lovastatin makes her legs hurt but they do not get better by holding the lovastatin Some numbness in the right foot  Total chol 180  Recent CT scan chest December 26, 2018 Three-vessel coronary artery calcification Aortic atherosclerosis Emphysema  Continues to have pain in Rt knee and both her legs- Takes Hydrocodone  Recent labs; Hgb;14.9 Sugar;91, Se creat;0.9, A1c; 5.6,TSH;2.290, Total Cholesterol: 180, Triglycerides; 78   Prior CV studies:   The following studies were reviewed today:  Echocardiogram January 2017 Ejection fraction 50 to 55%, mild MR   Past Medical History:  Diagnosis Date  . Barrett esophagus   . Depression   . Generalized OA   . HTN (hypertension)   . Hyperlipemia   . Hypothyroid   . Insomnia   . Lung cancer ([place]) 11/2015   treated with radiation  . Personal history of radiation therapy 2017   lung Ca  . Uterine cancer ([place]) 1981   uterus ca with a hysterectomy   Past Surgical History:  Procedure Laterality Date  . [place], [place]    .  BREAST BIOPSY Right 1980's   neg  . KNEE ARTHROSCOPY Right 2000   [place]  . right breast bx       Current Meds  Medication Sig  . aspirin EC 81 MG tablet Take by mouth.  . Calcium Carbonate-Vitamin D (OYSTER SHELL CALCIUM 500 + D) 500-125 MG-UNIT TABS Take by mouth.  Marland Kitchen HYDROcodone-acetaminophen (NORCO/VICODIN) 5-325 MG tablet Take 1 tablet by mouth daily as needed for moderate pain.    Marland Kitchen ibuprofen (ADVIL,MOTRIN) 600 MG tablet Take 600 mg by mouth every 6 (six) hours as needed for moderate pain.   Marland Kitchen lovastatin (MEVACOR) 20 MG tablet Take 20 mg by mouth daily.  . meloxicam (MOBIC) 15 MG tablet   . Multiple Vitamin (MULTI-VITAMINS) TABS Take 1 tablet by mouth daily.  Marland Kitchen omeprazole (PRILOSEC) 20 MG capsule Take 40 mg by mouth daily.   . sertraline (ZOLOFT) 100 MG tablet Take 100 mg by mouth daily.  Marland Kitchen SYNTHROID 88 MCG tablet Take 88 mcg by mouth daily before breakfast.  . tetrahydrozoline (VISINE) 0.05 % ophthalmic solution Place 1 drop into both eyes.  Marland Kitchen zolpidem (AMBIEN) 5 MG tablet Take 5 mg by mouth at bedtime as needed for sleep. Reported on 04/04/2016  . [DISCONTINUED] amLODipine (NORVASC) 10 MG tablet Take 0.5 tablets (5 mg total) by mouth daily.     Allergies:   Atorvastatin; Bupropion; Other; Percodan [oxycodone-aspirin]; and Pravastatin   Social History   Tobacco Use  . Smoking status: Never Smoker  . Smokeless tobacco: Never Used  Substance Use Topics  . Alcohol use: No    Alcohol/week: 0.0 standard drinks  . Drug use: No     Current Outpatient Medications on File Prior to Visit  Medication Sig Dispense Refill  . aspirin EC 81 MG tablet Take by mouth.    . Calcium Carbonate-Vitamin D (OYSTER SHELL CALCIUM 500 + D) 500-125 MG-UNIT TABS Take by mouth.    Marland Kitchen HYDROcodone-acetaminophen (NORCO/VICODIN) 5-325 MG tablet Take 1 tablet by mouth daily as needed for moderate pain.     Marland Kitchen ibuprofen (ADVIL,MOTRIN) 600 MG tablet Take 600 mg by mouth every 6 (six) hours as needed for moderate pain.     Marland Kitchen lovastatin (MEVACOR) 20 MG tablet Take 20 mg by mouth daily.    . meloxicam (MOBIC) 15 MG tablet     . Multiple Vitamin (MULTI-VITAMINS) TABS Take 1 tablet by mouth daily.    Marland Kitchen omeprazole (PRILOSEC) 20 MG capsule Take 40 mg by mouth daily.     . sertraline (ZOLOFT) 100 MG tablet Take 100 mg by mouth daily.    Marland Kitchen SYNTHROID 88 MCG tablet Take 88 mcg by mouth daily before  breakfast.    . tetrahydrozoline (VISINE) 0.05 % ophthalmic solution Place 1 drop into both eyes.    Marland Kitchen zolpidem (AMBIEN) 5 MG tablet Take 5 mg by mouth at bedtime as needed for sleep. Reported on 04/04/2016    . temazepam (RESTORIL) 15 MG capsule Take 15 mg by mouth at bedtime as needed for sleep.      No current facility-administered medications on file prior to visit.      Family Hx: The patient's family history includes Breast cancer in her maternal aunt; Breast cancer (age of onset: 19) in her sister; Hypertension in her unknown relative.  ROS:   Please see the history of present illness.    Review of Systems  Constitutional: Negative.   Respiratory: Positive for shortness of breath.   Cardiovascular: Negative.  Gastrointestinal: Negative.   Musculoskeletal: Negative.   Neurological: Negative.   Psychiatric/Behavioral: Negative.   All other systems reviewed and are negative.     Labs/Other Tests and Data Reviewed:    Recent Labs: 12/26/2018: Creatinine, Ser 1.00   Recent Lipid Panel No results found for: CHOL, TRIG, HDL, CHOLHDL, LDLCALC, LDLDIRECT  Wt Readings from Last 3 Encounters:  05/14/18 151 lb 5.5 oz (68.6 kg)  05/02/17 151 lb 10.8 oz (68.8 kg)  10/25/16 158 lb 13.5 oz (72 kg)     Exam:    Vital Signs: Vital signs may also be detailed in the HPI There were no vitals taken for this visit.  Wt Readings from Last 3 Encounters:  05/14/18 151 lb 5.5 oz (68.6 kg)  05/02/17 151 lb 10.8 oz (68.8 kg)  10/25/16 158 lb 13.5 oz (72 kg)   Temp Readings from Last 3 Encounters:  05/14/18 (!) 96.3 F (35.7 C)  05/02/17 97 F (36.1 C)  10/25/16 (!) 95.9 F (35.5 C)   BP Readings from Last 3 Encounters:  05/14/18 138/83  05/02/17 (!) 146/86  10/25/16 135/76   Pulse Readings from Last 3 Encounters:  05/14/18 83  05/02/17 75  10/25/16 76    150s over 80 Pulse 80 Respirations 63  Well nourished, well developed female in no acute distress. Constitutional:   oriented to person, place, and time. No distress.  Head: Normocephalic and atraumatic.  Eyes:  no discharge. No scleral icterus.  Neck: Normal range of motion. Neck supple.  Pulmonary/Chest: No audible wheezing, no distress, appears comfortable Musculoskeletal: Normal range of motion.  no  tenderness or deformity.  Neurological:   Coordination normal. Full exam not performed Skin:  No rash Psychiatric:  normal mood and affect. behavior is normal. Thought content normal.    ASSESSMENT & PLAN:    Chest pain of uncertain etiology Atypical symptoms, no further work-up at this time We have requested old records from [facility] including echocardiogram  Dizziness Less likely arrhythmia, will follow up with her after medication changes  Aortic atherosclerosis (HCC) Seen on CT scan Moderate in intensity throughout Recommend she add Zetia to her statin to achieve LDL less than 70  Coronary artery calcification Significant coronary calcification and aortic atherosclerosis noted More aggressive cholesterol management as above  Centrilobular emphysema (HCC) Chronic shortness of breath  Essential hypertension Suggested she change her HCTZ to losartan HCTZ 50/12.5 mg daily Move the amlodipine to the evening   COVID-19 Education: The signs and symptoms of COVID-19 were discussed with the patient and how to seek care for testing (follow up with PCP or arrange E-visit).  The importance of social distancing was discussed today.  Patient Risk:   After full review of this patients clinical status, I feel that they are at least moderate risk at this time.  Time:   Today, I have spent 25 minutes with the patient with telehealth technology discussing the cardiac and medical problems/diagnoses detailed above   10 min spent reviewing the chart prior to patient visit today   Medication Adjustments/Labs and Tests Ordered: Current medicines are reviewed at length with the patient today.   Concerns regarding medicines are outlined above.   Tests Ordered: No tests ordered   Medication Changes: No changes made   Disposition: Follow-up in 6 months   Signed, Ida Rogue, MD  04/08/2019 6:52 PM    [place] Office [address] [place] [address], [place], [place] [address]

## 2019-04-08 ENCOUNTER — Telehealth (INDEPENDENT_AMBULATORY_CARE_PROVIDER_SITE_OTHER): Payer: Medicare Other | Admitting: Cardiovascular Disease

## 2019-04-08 ENCOUNTER — Encounter

## 2019-04-08 ENCOUNTER — Other Ambulatory Visit: Payer: Self-pay

## 2019-04-08 DIAGNOSIS — I2584 Coronary atherosclerosis due to calcified coronary lesion: Secondary | ICD-10-CM

## 2019-04-08 DIAGNOSIS — J432 Centrilobular emphysema: Secondary | ICD-10-CM

## 2019-04-08 DIAGNOSIS — I700 Atherosclerosis of aorta: Secondary | ICD-10-CM

## 2019-04-08 DIAGNOSIS — R0789 Other chest pain: Secondary | ICD-10-CM

## 2019-04-08 DIAGNOSIS — I2510 Atherosclerotic heart disease of native coronary artery without angina pectoris: Secondary | ICD-10-CM

## 2019-04-08 DIAGNOSIS — R42 Dizziness and giddiness: Secondary | ICD-10-CM

## 2019-04-08 DIAGNOSIS — R079 Chest pain, unspecified: Secondary | ICD-10-CM

## 2019-04-08 MED ORDER — EZETIMIBE 10 MG PO TABS: 10.0000 mg | ORAL_TABLET | Freq: Every day | ORAL | 3 refills | Status: DC

## 2019-04-08 MED ORDER — LOSARTAN POTASSIUM-HCTZ 50-12.5 MG PO TABS: 1.0000 | ORAL_TABLET | Freq: Every day | ORAL | 3 refills | Status: DC

## 2019-04-08 MED ORDER — AMLODIPINE BESYLATE 10 MG PO TABS: 10.0000 mg | ORAL_TABLET | Freq: Every day | ORAL | 3 refills | Status: DC

## 2019-04-08 NOTE — Patient Instructions (Addendum)
Records requested from [facility], [place], [place] We want the carotid ultrasound and echocardiogram that were done there.    Medication Instructions:  Your physician has recommended you make the following change in your medication:  1. INCREASE Amlodipine 10 mg once daily in the evening. 2. START Ezetimibe 10 mg once daily 3. START Losartan-Hydrochlorothiazide (Hyzaar) 50-12.5 mg once daily  If you need a refill on your cardiac medications before your next appointment, please call your pharmacy.    Lab work: No new labs needed   If you have labs (blood work) drawn today and your tests are completely normal, you will receive your results only by: Marland Kitchen MyChart Message (if you have MyChart) OR . A paper copy in the mail If you have any lab test that is abnormal or we need to change your treatment, we will call you to review the results.   Testing/Procedures: None at this time.   Follow-Up: At [facility], you and your health needs are our priority.  As part of our continuing mission to provide you with exceptional heart care, we have created designated Provider Care Teams.  These Care Teams include your primary Cardiologist (physician) and Advanced Practice Providers (APPs -  Physician Assistants and Nurse Practitioners) who all work together to provide you with the care you need, when you need it.  . You will need a follow up appointment in 6 months .   Please call our office 2 months in advance to schedule this appointment.    . Providers on your designated Care Team:   . Murray Hodgkins, NP . Christell Faith, PA-C  . Marrianne Mood, PA-C  Any Other Special Instructions Will Be Listed Below (If Applicable).  For educational health videos Log in to : [email or web address] Or : [email or web address], password : [email or web address]

## 2019-04-29 ENCOUNTER — Telehealth: Payer: Self-pay | Admitting: Cardiovascular Disease

## 2019-04-29 NOTE — Telephone Encounter (Signed)
Pt c/o medication issue:  1. Name of Medication: ezetimibe   2. How are you currently taking this medication (dosage and times per day)? 10 MG 1 daily   3. Are you having a reaction (difficulty breathing--STAT)? BP was high, stopped taking now BP is too low, dizziness  4. What is your medication issue? Patient daughter calling, states that the ezetimibe medication has been causing issues with BP. They have since stopped taking the medication.  Would like to know other options.

## 2019-04-29 NOTE — Telephone Encounter (Signed)
Pts daughter, Davy Pique, called to report the pt has been having low BP readings and some dizziness... she says the dizziness is worse in the morning after getting up. She eats fairly early and has not had any changes in her diet.   She has noticed it started after starting the Losartan/ HCTZ 50/12.5 03/2019 but worse the last few days.   Pt does not have a list of her readings but says the systolic has been 01-499.. unsure of her diastolic and HR....  I had her check it and it was 112/64 HR74. I have asked that they keep a log for Dr. Donivan Scull review along with her symptoms noted at those times.   I will also forward her message to him for her review for any changes prior to obtaining a full list of BP readings.

## 2019-04-30 NOTE — Telephone Encounter (Signed)
My last note before starting losartan HCTZ reported she was having dizziness, chronic issue? Blood pressures were elevated at that time as well as several BP measurements by others, If she feels that BP is now low, She could stop the norvasc

## 2019-05-01 NOTE — Telephone Encounter (Signed)
Spoke with patient's daughter. She verbalized understanding to have the patient stop the amlodipine and continue the Losartan/HCTZ. They will continue to monitor her BP and symptoms over the next 1-2 weeks and let us know if there is no improvement.

## 2019-05-07 ENCOUNTER — Ambulatory Visit: Payer: Medicare Other

## 2019-05-12 ENCOUNTER — Other Ambulatory Visit: Payer: Self-pay

## 2019-05-13 ENCOUNTER — Encounter: Payer: Self-pay | Admitting: Radiation Oncology

## 2019-05-13 ENCOUNTER — Ambulatory Visit
Admission: RE | Admit: 2019-05-13 | Discharge: 2019-05-13 | Disposition: A | Discharge status: Home / Self Care | Payer: Medicare Other | Source: Ambulatory Visit | Attending: Radiation Oncology | Admitting: Radiation Oncology

## 2019-05-13 ENCOUNTER — Other Ambulatory Visit: Payer: Self-pay | Admitting: *Deleted

## 2019-05-13 ENCOUNTER — Other Ambulatory Visit: Payer: Self-pay

## 2019-05-13 VITALS — BP 113/73 | HR 74 | Temp 98.0°F | Resp 18 | Wt 143.4 lb

## 2019-05-13 DIAGNOSIS — C3412 Malignant neoplasm of upper lobe, left bronchus or lung: Secondary | ICD-10-CM

## 2019-05-13 DIAGNOSIS — Z85118 Personal history of other malignant neoplasm of bronchus and lung: Secondary | ICD-10-CM | POA: Insufficient documentation

## 2019-05-13 DIAGNOSIS — J8410 Pulmonary fibrosis, unspecified: Secondary | ICD-10-CM | POA: Insufficient documentation

## 2019-05-13 DIAGNOSIS — Z923 Personal history of irradiation: Secondary | ICD-10-CM | POA: Diagnosis not present

## 2019-05-13 NOTE — Progress Notes (Signed)
ambulatory

## 2019-05-13 NOTE — Progress Notes (Signed)
Radiation Oncology Follow up Note  Name: Lauren Hammond   Date:   05/13/2019 MRN:  [id number] DOB: 04/27/1932    This 85 y.o. female presents to the clinic today for 3-year follow-up status post SBRT to her left upper lobe for stage I adenocarcinoma.  REFERRING PROVIDER: Tracie Harrier, MD  HPI: Patient is an 85 year old female now out 3 years having completed SBRT to her left upper lobe for stage I adenocarcinoma.  Seen today in routine follow-up she is doing fairly well she states she does have breathing problems no cough hemoptysis or chest tightness..  She had a CT scan back in general January showing stable bandlike area of radiation fibrosis in the left upper lobe no findings for suspicious or residual disease.  COMPLICATIONS OF TREATMENT: none  FOLLOW UP COMPLIANCE: keeps appointments   PHYSICAL EXAM:  BP 113/73   Pulse 74   Temp 98 F (36.7 C)   Resp 18   Wt 143 lb 6.6 oz (65.1 kg)   BMI 23.15 kg/m  Well-developed well-nourished patient in NAD. HEENT reveals PERLA, EOMI, discs not visualized.  Oral cavity is clear. No oral mucosal lesions are identified. Neck is clear without evidence of cervical or supraclavicular adenopathy. Lungs are clear to A&P. Cardiac examination is essentially unremarkable with regular rate and rhythm without murmur rub or thrill. Abdomen is benign with no organomegaly or masses noted. Motor sensory and DTR levels are equal and symmetric in the upper and lower extremities. Cranial nerves II through XII are grossly intact. Proprioception is intact. No peripheral adenopathy or edema is identified. No motor or sensory levels are noted. Crude visual fields are within normal range.  RADIOLOGY RESULTS: CT scans reviewed and compatible with above-stated findings  PLAN: Present time patient is doing fairly well.  She does complain that pulmonary functions seem to be deteriorating I have referred her to Nehemiah Massed for pulmonary consultation.  I have  otherwise asked to see her back in 1 year for follow-up.  Patient knows to call at anytime with any concerns.  I would like to take this opportunity to thank you for allowing me to participate in the care of your patient.Noreene Filbert, MD

## 2019-05-25 ENCOUNTER — Telehealth: Payer: Self-pay | Admitting: Pulmonary Disease

## 2019-05-25 NOTE — Telephone Encounter (Signed)
Called patient for COVID-19 pre-screening for in office visit.  Have you recently traveled any where out of the local area in the last 2 weeks? No  Have you been in close contact with a person diagnosed with COVID-19 or someone awaiting results within the last 2 weeks? No   Do you currently have any of the following symptoms? If so, when did they start? Cough (Yes- 2-53mths)              Diarrhea              Joint Pain Fever      Muscle Pain   Red eyes Shortness of breath (Yes- since Feb.) Abdominal pain                     Vomiting Loss of smell    Rash    Sore Throat Headache    Weakness   Bruising or bleeding  Runny nose- allergies   Okay to proceed with visit 05/26/2019

## 2019-05-25 NOTE — Telephone Encounter (Signed)
Okay to proceed.  

## 2019-05-26 ENCOUNTER — Ambulatory Visit
Admission: RE | Admit: 2019-05-26 | Discharge: 2019-05-26 | Disposition: A | Discharge status: Home / Self Care | Payer: Medicare Other | Source: Ambulatory Visit | Attending: Pulmonary Disease | Admitting: Pulmonary Disease

## 2019-05-26 ENCOUNTER — Other Ambulatory Visit: Payer: Self-pay

## 2019-05-26 ENCOUNTER — Encounter: Payer: Self-pay | Admitting: Pulmonary Disease

## 2019-05-26 ENCOUNTER — Ambulatory Visit (INDEPENDENT_AMBULATORY_CARE_PROVIDER_SITE_OTHER): Payer: Medicare Other | Admitting: Pulmonary Disease

## 2019-05-26 VITALS — BP 138/72 | HR 71 | Temp 97.3°F | Ht 66.0 in | Wt 142.6 lb

## 2019-05-26 DIAGNOSIS — R0609 Other forms of dyspnea: Secondary | ICD-10-CM

## 2019-05-26 DIAGNOSIS — R0789 Other chest pain: Secondary | ICD-10-CM

## 2019-05-26 DIAGNOSIS — J701 Chronic and other pulmonary manifestations due to radiation: Secondary | ICD-10-CM | POA: Diagnosis not present

## 2019-05-26 DIAGNOSIS — IMO0002 Reserved for concepts with insufficient information to code with codable children: Secondary | ICD-10-CM

## 2019-05-26 DIAGNOSIS — R918 Other nonspecific abnormal finding of lung field: Secondary | ICD-10-CM

## 2019-05-26 MED ORDER — ANORO ELLIPTA 62.5-25 MCG/INH IN AEPB: 1.0000 | INHALATION_SPRAY | Freq: Every day | RESPIRATORY_TRACT | 0 refills | Status: AC

## 2019-05-26 NOTE — Patient Instructions (Signed)
To evaluate shortness of breath the following studies have been ordered: 1) chest x-ray today 2) pulmonary (lung) function tests  3) echocardiogram (ultrasound evaluation of heart)  Trial of Anoro inhaler.  Use 1 inhalation daily in the morning.  Use up sample provided and please note whether you believe that this has helped your shortness of breath  Follow-up in 3-4 weeks.  Call sooner if needed

## 2019-05-26 NOTE — Progress Notes (Signed)
PULMONARY CONSULT NOTE  Requesting MD/Service: Noreene Filbert MD Date of initial consultation: 05/26/19 Reason for consultation: DOE  PT PROFILE: 85 y.o. female never smoker (but significant cumulative lifetime exposure to second hand smoke) with history of LUL adenocarcinoma treated with XRT without evidence of recurrence referred for evaluation of gradually progressive exertional dyspnea  DATA: 04/30/18 CT chest: Bandlike area of masslike architectural distortion within the anterior left upper lobe is identified, image number 35/3 and image 35/4. This measures 2.3 x 1.2 by 1.0 cm. The underlying lung lesion is no longer visible. Pulmonary nodule in the right lower lobe measures 4 mm and is unchanged from previous exam. There is new subsegmental atelectasis identified within the left lung base 12/26/18 CT chest: Stable bandlike area of radiation fibrosis in the left upper lobe. No findings suspicious for residual or recurrent tumor. [place] LLL atelectasis  INTERVAL:  HPI:  She reports "breathing problems" which she is noticed in the past 6 to 12 months.  She describes exertional dyspnea where she is out of breath after walking approximately 1 block.  Shortness of breath is sometimes associated with chest tightness.  She also notes occasional discomfort in her neck which can occur independently or in association with chest tightness.  She first called the symptoms to the attention of her primary care physician in January and a CT scan of the chest was performed with results as documented above.No specific therapies were rendered at that time.  In general, her shortness of breath has little moment to moment or day-to-day variations.  She has been unable to discern any exacerbating or alleviating factors.  She also describes shortness of breath upon lying flat in the bed at night.  However, this "orthopnea" only lasts for a few seconds and resolves spontaneously.  She denies paroxysmal nocturnal  dyspnea.  She has chronic cough which is mostly throat clearing cough.  She has been seen by Dr. Tami Ribas and diagnosed with posterior nasal drainage.  She has chronic hoarseness and raspy voice which comes and goes.  She denies pleuritic chest pain, purulent sputum, hemoptysis, lower extremity edema, calf tenderness.  Past Medical History:  Diagnosis Date  . Barrett esophagus   . Depression   . Generalized OA   . HTN (hypertension)   . Hyperlipemia   . Hypothyroid   . Insomnia   . Lung cancer ([place]) 11/2015   treated with radiation  . Personal history of radiation therapy 2017   lung Ca  . Uterine cancer ([place]) 1981   uterus ca with a hysterectomy    Past Surgical History:  Procedure Laterality Date  . [place], [place]    . BREAST BIOPSY Right 1980's   neg  . KNEE ARTHROSCOPY Right 2000   [place]  . right breast bx      MEDICATIONS: I have reviewed all medications and confirmed regimen as documented  Social History   Socioeconomic History  . Marital status: Married    Spouse name: Not on file  . Number of children: Not on file  . Years of education: Not on file  . Highest education level: Not on file  Occupational History  . Not on file  Social Needs  . Financial resource strain: Not on file  . Food insecurity    Worry: Not on file    Inability: Not on file  . Transportation needs    Medical: Not on file    Non-medical: Not  on file  Tobacco Use  . Smoking status: Never Smoker  . Smokeless tobacco: Never Used  Substance and Sexual Activity  . Alcohol use: No    Alcohol/week: 0.0 standard drinks  . Drug use: No  . Sexual activity: Not on file  Lifestyle  . Physical activity    Days per week: Not on file    Minutes per session: Not on file  . Stress: Not on file  Relationships  . Social Herbalist on phone: Not on file    Gets together: Not on file    Attends religious service: Not  on file    Active member of club or organization: Not on file    Attends meetings of clubs or organizations: Not on file    Relationship status: Not on file  . Intimate partner violence    Fear of current or ex partner: Not on file    Emotionally abused: Not on file    Physically abused: Not on file    Forced sexual activity: Not on file  Other Topics Concern  . Not on file  Social History Narrative  . Not on file    Family History  Problem Relation Age of Onset  . Hypertension Unknown   . Breast cancer Maternal Aunt   . Breast cancer Sister 76    ROS: No fever, myalgias/arthralgias, unexplained weight loss or weight gain No new focal weakness or sensory deficits No otalgia, hearing loss, visual changes, nasal and sinus symptoms, mouth and throat problems No neck pain or adenopathy No abdominal pain, N/V/D, diarrhea, change in bowel pattern No dysuria, change in urinary pattern   Vitals:   05/26/19 1026 05/26/19 1033  BP:  138/72  Pulse:  71  Temp:  (!) 97.3 F (36.3 C)  TempSrc:  Temporal  SpO2:  95%  Weight: 142 lb 9.6 oz (64.7 kg)   Height: 5\' 6"  (1.676 m)      EXAM:  Gen: WDWN, No overt respiratory distress HEENT: NCAT, sclera white, oropharynx normal Neck: Supple without LAN, thyromegaly, JVD Lungs: breath sounds slightly coarse and mildly diminished without wheezes or rhonchi.  Percussion is normal. Cardiovascular: RRR, no murmurs noted Abdomen: Soft, nontender, normal BS Ext: without clubbing, cyanosis, edema Neuro: CNs grossly intact, motor and sensory intact Skin: Limited exam, no lesions noted  DATA:   BMP Latest Ref Rng & Units 12/26/2018 04/30/2018 10/16/2016  Glucose 65 - 99 mg/dL - - -  BUN 6 - 20 mg/dL - - -  Creatinine 0.44 - 1.00 mg/dL 1.00 0.90 1.00  Sodium 135 - 145 mmol/L - - -  Potassium 3.5 - 5.1 mmol/L - - -  Chloride 101 - 111 mmol/L - - -  CO2 22 - 32 mmol/L - - -  Calcium 8.9 - 10.3 mg/dL - - -    CBC Latest Ref Rng & Units  04/18/2016 12/31/2015 12/02/2015  WBC 4.0 - 10.5 K/uL 5.4 9.2 8.6  Hemoglobin 12.0 - 15.0 g/dL 14.8 15.3 13.0  Hematocrit 36.0 - 46.0 % 44.3 46.3 39.2  Platelets 150 - 400 K/uL 163 175 172    CXR: No recent film  I have personally reviewed all chest radiographs reported above including CXRs and CT chest unless otherwise indicated  IMPRESSION:     ICD-10-CM   1. DOE (dyspnea on exertion)  R06.09 DG Chest 2 View    Pulmonary Function Test [facility] Only    ECHOCARDIOGRAM COMPLETE  2. Chest tightness  R07.89  3. Mild LUL radiation fibrosis - not likely a major contributor to exertional dyspnea  J70.1   4. LLL opacity on repeated CT chest.  Appearance consistent with atelectasis  R91.8    Dyspnea is largely unexplained.  I would be most concerned about a cardiac etiology given her description of symptoms.  On CT of chest there appears to be an area of atelectasis in LLL which persisted from June 2019 to January 2020.  Unclear if this is contributing to dyspnea.  She has substantial exposure to secondhand smoke over her lifetime.  It is possible she has a component of COPD.    PLAN:  CXR today  Sample of Anoro inhaler provided.  She is to use the sample pain attention to her respiratory symptoms to see if there seems to be any improvement.  If she can discern significant improvement in her respiratory symptoms, we will place a prescription for this medication.  Echocardiogram ordered  PFTs ordered  Follow-up in 4 weeks.  Call sooner if needed   Merton Border, MD PCCM service Mobile [phone] Pager [phone] 05/26/2019 11:46 AM

## 2019-05-27 ENCOUNTER — Telehealth: Payer: Self-pay | Admitting: Pulmonary Disease

## 2019-05-27 NOTE — Telephone Encounter (Signed)
Pt Stated that she is having a difficult time getting her Anoro Inhaler to work properly. She states she following the instructions but doesn't feel like anything is coming out when she inhales so she has done that 3 times and the number is now down to 3. Pt was reassured that if the number is going down that it is functioning properly and advised pt only to use it one time a day. Pt was asked  to report back to [place] after a week of use to let us know if there has or has not been any improvement in her symptoms. I will make Dr.Simonds aware of patients issue.

## 2019-05-28 NOTE — Telephone Encounter (Signed)
The medicine is a very fine powder that really can't be felt or tasted. She is almost certainly getting the medication.

## 2019-05-28 NOTE — Telephone Encounter (Signed)
I spoke to patient, she stated she was able to get it to work today for the first time. I gave her Dr. Alva Garnet note below. Also re-iterated to patient to only take once a day. She is aware. No further questions at this time.

## 2019-06-03 ENCOUNTER — Telehealth: Payer: Self-pay

## 2019-06-03 NOTE — Telephone Encounter (Signed)

## 2019-06-04 ENCOUNTER — Other Ambulatory Visit: Payer: Self-pay

## 2019-06-04 ENCOUNTER — Ambulatory Visit (INDEPENDENT_AMBULATORY_CARE_PROVIDER_SITE_OTHER): Payer: Medicare Other

## 2019-06-04 DIAGNOSIS — R0609 Other forms of dyspnea: Secondary | ICD-10-CM

## 2019-06-19 ENCOUNTER — Other Ambulatory Visit: Payer: Self-pay

## 2019-06-23 ENCOUNTER — Telehealth: Payer: Self-pay | Admitting: Pulmonary Disease

## 2019-06-23 NOTE — Telephone Encounter (Signed)
Pt is aware of date/time of covid test and voiced her understanding.  Nothing further is needed.

## 2019-06-26 ENCOUNTER — Other Ambulatory Visit: Payer: Self-pay

## 2019-06-29 ENCOUNTER — Other Ambulatory Visit: Payer: Self-pay

## 2019-06-29 ENCOUNTER — Other Ambulatory Visit
Admission: RE | Admit: 2019-06-29 | Discharge: 2019-06-29 | Disposition: A | Discharge status: Home / Self Care | Payer: Medicare Other | Source: Ambulatory Visit | Attending: Pulmonary Disease | Admitting: Pulmonary Disease

## 2019-06-29 DIAGNOSIS — Z20828 Contact with and (suspected) exposure to other viral communicable diseases: Secondary | ICD-10-CM | POA: Diagnosis not present

## 2019-06-29 DIAGNOSIS — Z01812 Encounter for preprocedural laboratory examination: Secondary | ICD-10-CM | POA: Diagnosis present

## 2019-06-29 LAB — SARS CORONAVIRUS 2 (TAT 6-24 HRS): SARS Coronavirus 2: NEGATIVE

## 2019-07-02 ENCOUNTER — Ambulatory Visit: Payer: Medicare Other | Attending: Pulmonary Disease

## 2019-07-02 ENCOUNTER — Other Ambulatory Visit: Payer: Self-pay

## 2019-07-02 DIAGNOSIS — R0609 Other forms of dyspnea: Secondary | ICD-10-CM | POA: Insufficient documentation

## 2019-07-02 MED ORDER — ALBUTEROL SULFATE (2.5 MG/3ML) 0.083% IN NEBU
2.5000 mg | INHALATION_SOLUTION | Freq: Once | RESPIRATORY_TRACT | Status: AC
  Administered 2019-07-02: 09:00:00 2.5 mg via RESPIRATORY_TRACT
  Filled 2019-07-02: qty 3

## 2019-07-06 ENCOUNTER — Telehealth: Payer: Self-pay | Admitting: Pulmonary Disease

## 2019-07-06 NOTE — Telephone Encounter (Signed)
Called patient for COVID-19 pre-screening for in office visit.  Have you recently traveled any where out of the local area in the last 2 weeks? No  Have you been in close contact with a person diagnosed with COVID-19 or someone awaiting results within the last 2 weeks? No  Do you currently have any of the following symptoms? If so, when did they start? Cough Yes- several months)              Diarrhea                              Joint Pain Fever      Muscle Pain   Red eyes Shortness of breath   Abdominal pain  Vomiting Loss of smell    Rash    Sore Throat Headache    Weakness   Bruising or bleeding   Okay to proceed with visit 07/07/2019

## 2019-07-07 ENCOUNTER — Other Ambulatory Visit
Admission: RE | Admit: 2019-07-07 | Discharge: 2019-07-07 | Disposition: A | Discharge status: Home / Self Care | Payer: Medicare Other | Source: Ambulatory Visit | Attending: Pulmonary Disease | Admitting: Pulmonary Disease

## 2019-07-07 ENCOUNTER — Ambulatory Visit (INDEPENDENT_AMBULATORY_CARE_PROVIDER_SITE_OTHER): Payer: Medicare Other | Admitting: Pulmonary Disease

## 2019-07-07 ENCOUNTER — Other Ambulatory Visit: Payer: Self-pay

## 2019-07-07 ENCOUNTER — Encounter: Payer: Self-pay | Admitting: Pulmonary Disease

## 2019-07-07 DIAGNOSIS — I5189 Other ill-defined heart diseases: Secondary | ICD-10-CM

## 2019-07-07 DIAGNOSIS — I2584 Coronary atherosclerosis due to calcified coronary lesion: Secondary | ICD-10-CM

## 2019-07-07 DIAGNOSIS — R0609 Other forms of dyspnea: Secondary | ICD-10-CM

## 2019-07-07 DIAGNOSIS — J984 Other disorders of lung: Secondary | ICD-10-CM

## 2019-07-07 DIAGNOSIS — I2510 Atherosclerotic heart disease of native coronary artery without angina pectoris: Secondary | ICD-10-CM

## 2019-07-07 DIAGNOSIS — J986 Disorders of diaphragm: Secondary | ICD-10-CM | POA: Diagnosis not present

## 2019-07-07 DIAGNOSIS — R0601 Orthopnea: Secondary | ICD-10-CM

## 2019-07-07 DIAGNOSIS — J439 Emphysema, unspecified: Secondary | ICD-10-CM | POA: Diagnosis not present

## 2019-07-07 LAB — BRAIN NATRIURETIC PEPTIDE: B Natriuretic Peptide: 109 pg/mL — ABNORMAL HIGH (ref 0.0–100.0)

## 2019-07-07 MED ORDER — COMBIVENT RESPIMAT 20-100 MCG/ACT IN AERS: 1.0000 | INHALATION_SPRAY | Freq: Four times a day (QID) | RESPIRATORY_TRACT | 5 refills | Status: DC | PRN

## 2019-07-07 NOTE — Patient Instructions (Signed)
Blood test today: B natruretic peptide (this is a blood test measuring heart function).  We will contact you with results  New prescription: Combivent Respimat inhaler.  Use 1 inhalation up to every 6 hours as needed for increased shortness of breath, wheezing, chest tightness, cough  Follow-up 3 months with Dr. Mortimer Fries.  Call sooner if needed

## 2019-07-07 NOTE — Progress Notes (Signed)
PULMONARY OFFICE FOLLOW-UP NOTE  Requesting MD/Service: Noreene Filbert MD Date of initial consultation: 05/26/19 Reason for consultation: DOE  PT PROFILE: 85 y.o. female never smoker (but significant cumulative lifetime exposure to second hand smoke) with history of LUL adenocarcinoma treated with XRT without evidence of recurrence referred for evaluation of gradually progressive exertional dyspnea  DATA: 04/30/18 CT chest: Bandlike area of masslike architectural distortion within the anterior left upper lobe is identified, image number 35/3 and image 35/4. This measures 2.3 x 1.2 by 1.0 cm. The underlying lung lesion is no longer visible. Pulmonary nodule in the right lower lobe measures 4 mm and is unchanged from previous exam. There is new subsegmental atelectasis identified within the left lung base 12/26/18 CT chest: Stable bandlike area of radiation fibrosis in the left upper lobe. No findings suspicious for residual or recurrent tumor. [place] LLL atelectasis 06/04/19 Echocardiogram: LVEF 60-65%.  Diastolic Doppler parameters consistent with pseudonormalization.  LA mildly dilated.  RV size and function normal.  RVSP estimate 30 mmHg 07/02/19 PFTs: FVC: 1.63 > 1.70 L (61 > 64 %pred), FEV1: 1.35 > 1.38 L (69 > 70 %pred), FEV1/FVC: 83, TLC: 4.36 L (81 %pred), RV/TLC 114% pred, DLCO 67 %pred, DLCO/VA 81% pred  INTERVAL: Initial consultation 05/26/2019.  No major pulmonary events in the interim  SUBJ:  This is a scheduled follow-up to review results of pulmonary function tests, chest x-ray, echocardiogram and response to Anoro inhaler.  She has no new complaints.  She continues to have moderate exertional dyspnea with little moment to moment or day-to-day variation.  She obtained modest benefit from the Anoro inhaler.  She continues to be most troubled by dyspnea immediately upon assuming a supine position.  She denies CP, fever, purulent sputum, hemoptysis, LE edema and calf  tenderness.   Vitals:   07/07/19 1048  BP: 120/60  Pulse: 80  Temp: (!) 97 F (36.1 C)  TempSrc: Temporal  SpO2: 95%  Weight: 141 lb (64 kg)  Height: 5\' 6"  (1.676 m)     EXAM:  Gen: NAD HEENT: NCAT, sclerae white Neck: No JVD noted Lungs: breath sounds full except in L base where they are diminished to absent, no wheezes or other adventitious sounds Cardiovascular: RRR, no murmurs Abdomen: Soft, nontender, normal BS Ext: without clubbing, cyanosis.  Trace symmetric ankle edema Neuro: grossly intact Skin: Limited exam, no lesions noted   DATA:   BMP Latest Ref Rng & Units 12/26/2018 04/30/2018 10/16/2016  Glucose 65 - 99 mg/dL - - -  BUN 6 - 20 mg/dL - - -  Creatinine 0.44 - 1.00 mg/dL 1.00 0.90 1.00  Sodium 135 - 145 mmol/L - - -  Potassium 3.5 - 5.1 mmol/L - - -  Chloride 101 - 111 mmol/L - - -  CO2 22 - 32 mmol/L - - -  Calcium 8.9 - 10.3 mg/dL - - -    CBC Latest Ref Rng & Units 04/18/2016 12/31/2015 12/02/2015  WBC 4.0 - 10.5 K/uL 5.4 9.2 8.6  Hemoglobin 12.0 - 15.0 g/dL 14.8 15.3 13.0  Hematocrit 36.0 - 46.0 % 44.3 46.3 39.2  Platelets 150 - 400 K/uL 163 175 172    CXR 05/26/19: Elevated L hemidiaphragm (chronic)  I have personally reviewed all chest radiographs reported above including CXRs and CT chest unless otherwise indicated  IMPRESSION:     ICD-10-CM   1. DOE (dyspnea on exertion)  R06.09 Brain natriuretic peptide  2. Elevated L diaphragm - suspect paralysis  J98.6  3. 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 Diastolic dysfunction  L54.49 Brain natriuretic peptide  5. Diffuse coronary calcification noted on CT chest  I25.10    I25.84   6. Orthopnea  R06.01    Her chest x-ray (and CT scans of the chest dating back to June 2019) demonstrate elevated L hemidiaphragm which was not seen previously.  I suspect she has diaphragmatic paralysis.  She had radiation therapy to LUL and 2017 for adenocarcinoma of lung.  Phrenic  nerve injury/diaphragmatic paralysis has been described with radiation therapy but seems to be an exceedingly uncommon complication.  Further, the timing of onset of symptoms (almost 2 years after completion of radiation therapy) makes it unlikely for this to be the cause.  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  Exertional dyspnea could be on part due to diaphragmatic paralysis.  It is also noted that she has diastolic dysfunction by echocardiogram and evidence of coronary disease.  These might be contributors to dyspnea.  Her PFTs appear to reveal a mixed restrictive (due to diaphragmatic paralysis) and obstructive (manifesting mostly as gas trapping/elevated residual volume) pattern.  She got a small but discernible benefit from an oral inhaler.  However, the cost of this medication was prohibitive.  Symptom of orthopnea is likely explained by diaphragmatic paralysis.    PLAN:  We discussed my impression that she likely has L diaphragm paralysis.  We discussed possible further evaluation including sniff testing.  I explained that confirming the diagnosis would not lead to any change in therapy.  She wishes to forgo further evaluation.  Because of apparent benefit of Anoro but cost which is prohibitive, I have prescribed Combivent Respimat inhaler to be used 1 inhalation up to every 6 hours as needed for shortness of breath, wheezing, chest tightness, cough  BNP ordered for today.  If elevated, consider further cardiology evaluation and management  Follow-up in 3 months with Dr. Mortimer Fries.  Call sooner if needed   Merton Border, MD PCCM service Mobile [phone] Pager [phone] 07/08/2019 12:07 PM

## 2019-07-08 ENCOUNTER — Telehealth: Payer: Self-pay

## 2019-07-08 NOTE — Telephone Encounter (Signed)
Pt is aware of results and voiced her understanding. Nothing further is needed.  

## 2019-07-08 NOTE — Telephone Encounter (Signed)
-----   Message from Wilhelmina Mcardle, MD sent at 07/08/2019 10:04 AM EDT ----- Regarding: Lab result Let her know that the blood test to look at her heart function is normal. Thanks  Waunita Schooner

## 2019-09-14 ENCOUNTER — Other Ambulatory Visit: Payer: Self-pay | Admitting: Internal Medicine

## 2019-09-15 ENCOUNTER — Other Ambulatory Visit: Payer: Self-pay | Admitting: Internal Medicine

## 2019-09-15 DIAGNOSIS — Z1231 Encounter for screening mammogram for malignant neoplasm of breast: Secondary | ICD-10-CM

## 2019-09-18 ENCOUNTER — Other Ambulatory Visit: Payer: Self-pay

## 2019-09-18 ENCOUNTER — Ambulatory Visit
Admission: RE | Admit: 2019-09-18 | Discharge: 2019-09-18 | Disposition: A | Discharge status: Home / Self Care | Payer: Medicare Other | Source: Ambulatory Visit | Attending: Internal Medicine | Admitting: Internal Medicine

## 2019-09-18 DIAGNOSIS — Z1231 Encounter for screening mammogram for malignant neoplasm of breast: Secondary | ICD-10-CM | POA: Diagnosis not present

## 2019-11-13 ENCOUNTER — Ambulatory Visit (INDEPENDENT_AMBULATORY_CARE_PROVIDER_SITE_OTHER): Payer: Medicare Other | Admitting: Family Medicine

## 2019-11-13 ENCOUNTER — Other Ambulatory Visit: Payer: Self-pay

## 2019-11-13 ENCOUNTER — Encounter: Payer: Self-pay | Admitting: Nurse Practitioner

## 2019-11-13 VITALS — BP 122/68 | HR 74 | Temp 96.8°F | Ht 65.5 in | Wt 139.5 lb

## 2019-11-13 DIAGNOSIS — I10 Essential (primary) hypertension: Secondary | ICD-10-CM

## 2019-11-13 DIAGNOSIS — I2510 Atherosclerotic heart disease of native coronary artery without angina pectoris: Secondary | ICD-10-CM

## 2019-11-13 DIAGNOSIS — R079 Chest pain, unspecified: Secondary | ICD-10-CM | POA: Diagnosis not present

## 2019-11-13 DIAGNOSIS — J432 Centrilobular emphysema: Secondary | ICD-10-CM | POA: Diagnosis not present

## 2019-11-13 DIAGNOSIS — E782 Mixed hyperlipidemia: Secondary | ICD-10-CM

## 2019-11-13 DIAGNOSIS — I2584 Coronary atherosclerosis due to calcified coronary lesion: Secondary | ICD-10-CM

## 2019-11-13 NOTE — Progress Notes (Addendum)
Cardiology Office Note  Date: 11/13/2019   ID: SAYDA GRABLE, DOB 29-Jan-1932, MRN [id number]  PCP:  Tracie Harrier, MD  Cardiologist:  Ida Rogue, MD Electrophysiologist:  None   Chief Complaint  Patient presents with  . Follow-up    6 month follow up. Meds reviewed by the pt. verbally. Pt. c/o shortness of breath.     History of Present Illness: Lauren Hammond is a 85 y.o. female last seen by Dr. Rockey Situ via telemedicine visit April 13 , 2020. PMH of Lung Ca with radiation therapy, HLD, chronic chest and left arm pain, HTN.   Lung cancer with history of radiation therapy for left upper lobe lesion.  Hyperlipidemia, chronic left chest and arm pain, depression, dizziness, hypertension.  She complained of intermittent dizziness occasionally in the morning.  Chronic mild shortness of breath.  Patient denies any recent progressive anginal or exertional symptoms.  She does have some pulmonary fibrosis on recent CT scan and nodules in her right upper lobe.  She sees Dr. Alva Garnet pulmonology and has yearly CT scans.  She had a previous  Chest CT  in January 2020 which showed three-vessel coronary artery calcification and aortic atherosclerosis along with emphysema.  She has never had a stress test nor a coronary CT.  She does have a first-degree relative with early heart disease in her father who had an MI at age 36.  Her primary complaint is pain in her legs occurring at night with pain in lower legs and  restless leg syndrome. This is chronic per Dr Donivan Scull note in May 2020. She takes hydrocodone  with some relief.  Past Medical History:  Diagnosis Date  . Barrett esophagus   . Depression   . Generalized OA   . HTN (hypertension)   . Hyperlipemia   . Hypothyroid   . Insomnia   . Lung cancer ([place]) 11/2015   treated with radiation  . Personal history of radiation therapy 2017   lung Ca  . Uterine cancer ([place]) 1981   uterus ca with a hysterectomy    Past Surgical  History:  Procedure Laterality Date  . [place], [place]    . BREAST BIOPSY Right 1980's   neg  . KNEE ARTHROSCOPY Right 2000   [place]  . right breast bx      Current Outpatient Medications  Medication Sig Dispense Refill  . amLODipine (NORVASC) 10 MG tablet Take by mouth.    Marland Kitchen aspirin EC 81 MG tablet Take by mouth.    . Calcium Carbonate-Vitamin D (OYSTER SHELL CALCIUM 500 + D) 500-125 MG-UNIT TABS Take by mouth.    . gabapentin (NEURONTIN) 100 MG capsule Take by mouth at bedtime.     Marland Kitchen HYDROcodone-acetaminophen (NORCO/VICODIN) 5-325 MG tablet Take 1 tablet by mouth daily as needed for moderate pain.     Marland Kitchen ibuprofen (ADVIL,MOTRIN) 600 MG tablet Take 600 mg by mouth every 6 (six) hours as needed for moderate pain.     Marland Kitchen levothyroxine (SYNTHROID) 100 MCG tablet Take 100 mcg by mouth daily before breakfast.     . lovastatin (MEVACOR) 20 MG tablet Take 20 mg by mouth daily.    . meloxicam (MOBIC) 15 MG tablet Take 15 mg by mouth daily.     . Multiple Vitamin (MULTI-VITAMINS) TABS Take 1 tablet by mouth daily.    Marland Kitchen omeprazole (PRILOSEC) 20 MG capsule Take 40 mg by mouth daily as needed.     Marland Kitchen  sertraline (ZOLOFT) 100 MG tablet Take 100 mg by mouth daily.    . temazepam (RESTORIL) 15 MG capsule Take 15 mg by mouth at bedtime as needed for sleep.     Marland Kitchen tetrahydrozoline (VISINE) 0.05 % ophthalmic solution Place 1 drop into both eyes.    Marland Kitchen zolpidem (AMBIEN) 5 MG tablet Take 5 mg by mouth at bedtime as needed for sleep. Reported on 04/04/2016     No current facility-administered medications for this visit.   Allergies:  Atorvastatin, Bupropion, Other, Percodan [oxycodone-aspirin], and Pravastatin   Social History: The patient  reports that she has never smoked. She has never used smokeless tobacco. She reports that she does not drink alcohol or use drugs.   Family History: The patient's family history includes Breast cancer in her  maternal aunt; Breast cancer (age of onset: 39) in her sister; Hypertension in an other family member.   ROS:  Please see the history of present illness. Otherwise, complete review of systems is positive for none.  All other systems are reviewed and negative.   Physical Exam: VS:  BP 122/68 (BP Location: Left Arm, Patient Position: Sitting, Cuff Size: Normal)   Pulse 74   Temp (!) 96.8 F (36 C)   Ht 5' 5.5" (1.664 m)   Wt 139 lb 8 oz (63.3 kg)   SpO2 98%   BMI 22.86 kg/m , BMI Body mass index is 22.86 kg/m.  Wt Readings from Last 3 Encounters:  11/13/19 139 lb 8 oz (63.3 kg)  07/07/19 141 lb (64 kg)  05/26/19 142 lb 9.6 oz (64.7 kg)    General: Patient appears comfortable at rest. Neck: Supple, no elevated JVP or carotid bruits, no thyromegaly. Lungs: Clear to auscultation, nonlabored breathing at rest, prolonged expiratory phase. Cardiac: Regular rate and rhythm, no S3 or significant systolic murmur, no pericardial rub. Extremities: No pitting edema, distal pulses 2+. Skin: Warm and dry. Neuropsychiatric: Alert and oriented x3, affect grossly appropriate.  ECG:  An ECG dated July 14, 2019 was personally reviewed today and demonstrated:  Sinus rhythm with sinus arrhythmia with first-degree AV block rate of 71  Recent Labwork: 12/26/2018: Creatinine, Ser 1.00 07/07/2019: B Natriuretic Peptide 109.0  No results found for: CHOL, TRIG, HDL, CHOLHDL, VLDL, LDLCALC, LDLDIRECT  Other Studies Reviewed Today: Echocardiogram June 04, 2019 IMPRESSIONS   1. The left ventricle has normal systolic function with an ejection fraction of 60-65%. The cavity size was normal. Left ventricular diastolic Doppler parameters are consistent with pseudonormalization.  2. The right ventricle has normal systolic function. The cavity was normal. There is no increase in right ventricular wall thickness. Right ventricular systolic pressure is normal with an estimated pressure of 29.7 mmHg.  3. Left  atrial size was mildly dilated.  FINDINGS  Left Ventricle: The left ventricle has normal systolic function, with an ejection fraction of 60-65%. The cavity size was normal. There is no increase in left ventricular wall thickness. Left ventricular diastolic Doppler parameters are consistent with  pseudonormalization.  Right Ventricle: The right ventricle has normal systolic function. The cavity was normal. There is no increase in right ventricular wall thickness. Right ventricular systolic pressure is normal with an estimated pressure of 29.7 mmHg.  Left Atrium: Left atrial size was mildly dilated.  Right Atrium: Right atrial size was normal in size. Right atrial pressure is estimated at 10 mmHg.  Interatrial Septum: No atrial level shunt detected by color flow Doppler.  Pericardium: There is no evidence of pericardial effusion.  Mitral Valve: The mitral valve is normal in structure. Mitral valve regurgitation is mild by color flow Doppler.  Tricuspid Valve: The tricuspid valve is grossly normal. Tricuspid valve regurgitation is mild by color flow Doppler.  Aortic Valve: The aortic valve was not well visualized Aortic valve regurgitation was not visualized by color flow Doppler.  Pulmonic Valve: The pulmonic valve was grossly normal. Pulmonic valve regurgitation was not assessed by color flow Doppler.  Venous: The inferior vena cava measures 1.60 cm, is normal in size with greater than 50% respiratory variability.  Chest CT December 26, 2018  IMPRESSION: 1. Stable bandlike area of radiation fibrosis in the left upper lobe. No findings suspicious for residual or recurrent tumor. 2. Stable small pulmonary nodules in the right lung. No new or progressive findings. 3. No mediastinal or hilar mass or adenopathy. Stable small scattered lymph nodes.  FINDINGS: Cardiac shadow is stable. Elevation of left hemidiaphragm is noted with left basilar atelectasis stable from the  prior exam. Scarring is again noted in the left upper lobe also stable from the prior exam. No new focal infiltrate or sizable effusion is seen. No bony abnormality is noted.   Chest x-ray May 26, 2019 IMPRESSION: Chronic changes on the left stable from the previous exam. No acute abnormality noted.   Assessment and Plan:  1. Chest pain of uncertain etiology   2. Essential hypertension   3. Centrilobular emphysema ([place])   4. Coronary artery calcification   5. Mixed hyperlipidemia    1. Chest pain of uncertain etiology Patient denies any recent progressive anginal or exertional symptoms other than mild dyspnea on exertion.  Continue aspirin 81 mg, amlodipine 10 mg.  Advised patient if she begins to have chest pain associated with exertion or increased shortness of breath beyond the usual mild dyspnea on exertion we may need to schedule a Lexiscan stress test or coronary CT exam.  Patient wants to defer any testing for the near future.  2. Essential hypertension Pressure within normal limits today 122/68 with a heart rate of 74 and O2 saturations of 98%.  She states she stopped her losartan and blood pressures have remained within normal limits.  Continue Amlodipine 10 mg daily.  3. Centrilobular emphysema ([place]) She does have lung fibrosis secondary to radiation therapy for lung cancer.  She also has some nodules in her right lung on recent CT scan.  She has yearly CT scans ordered by pulmonology.  She follows with Dr. Alva Garnet pulmonology  4. Coronary artery calcification CT scan in June of last year showed aortic atherosclerosis and two-vessel coronary artery atherosclerotic calcification.  Patient can planes of occasional discomfort under her left breast but attributes it to elevated left hemidiaphragm and lung fibrosis.  Discomfort is not associated with exertion nor does she have any radiation or associated nausea, vomiting, diaphoresis  5. Mixed hyperlipidemia Taking lovastatin  20 mg.  States she stopped the Zetia because it made her feel dizzy.  She has been intolerant to at least 2  statins in the past  Medication Adjustments/Labs and Tests Ordered: Current medicines are reviewed at length with the patient today.  Concerns regarding medicines are outlined above.    Patient Instructions  Medication Instructions:  Your physician recommends that you continue on your current medications as directed. Please refer to the Current Medication list given to you today.  *If you need a refill on your cardiac medications before your next appointment, please call your pharmacy*  Lab Work: None ordered  If you have labs (blood work) drawn today and your tests are completely normal, you will receive your results only by: Marland Kitchen MyChart Message (if you have MyChart) OR . A paper copy in the mail If you have any lab test that is abnormal or we need to change your treatment, we will call you to review the results.  Testing/Procedures: None ordered   Follow-Up: At [facility], you and your health needs are our priority.  As part of our continuing mission to provide you with exceptional heart care, we have created designated Provider Care Teams.  These Care Teams include your primary Cardiologist (physician) and Advanced Practice Providers (APPs -  Physician Assistants and Nurse Practitioners) who all work together to provide you with the care you need, when you need it.  Your next appointment:   12 month(s)  The format for your next appointment:   In Person  Provider:    You may see Dr. Rockey Situ or Murray Hodgkins, NP.         Signed, Levell July, NP 11/13/2019 3:06 PM    [facility] at [place], [place], [place] [address] Phone: [phone]; Fax: [phone]

## 2019-11-13 NOTE — Patient Instructions (Signed)
Medication Instructions:  Your physician recommends that you continue on your current medications as directed. Please refer to the Current Medication list given to you today.  *If you need a refill on your cardiac medications before your next appointment, please call your pharmacy*  Lab Work: None ordered  If you have labs (blood work) drawn today and your tests are completely normal, you will receive your results only by: Marland Kitchen MyChart Message (if you have MyChart) OR . A paper copy in the mail If you have any lab test that is abnormal or we need to change your treatment, we will call you to review the results.  Testing/Procedures: None ordered   Follow-Up: At [facility], you and your health needs are our priority.  As part of our continuing mission to provide you with exceptional heart care, we have created designated Provider Care Teams.  These Care Teams include your primary Cardiologist (physician) and Advanced Practice Providers (APPs -  Physician Assistants and Nurse Practitioners) who all work together to provide you with the care you need, when you need it.  Your next appointment:   12 month(s)  The format for your next appointment:   In Person  Provider:    You may see Dr. Rockey Situ or Murray Hodgkins, NP.

## 2020-02-11 ENCOUNTER — Telehealth: Payer: Self-pay | Admitting: Cardiovascular Disease

## 2020-02-11 NOTE — Telephone Encounter (Signed)
Spoke with patient. States her BP in the morning is higher than in the evening:      147/89       154/78      146/77      161/80's  In the afternoon and evening BP runs 135/70.  Some occasional dizziness. Denies headache, blurred vision or chest pain. Has a dry cough which started around Christmas.  She takes her Amlodipine mg at bedtime.  Advised her to continue medications and monitoring of BP at this time. Let us know if BP runs greater than 140/90. Will go ahead and route to Dr Rockey Situ for review and recommendations.

## 2020-02-11 NOTE — Telephone Encounter (Signed)
Pt c/o BP issue: STAT if pt c/o blurred vision, one-sided weakness or slurred speech  1. What are your last 5 BP readings?   All in morning patient takes meds at night  As day goes on comes down a little but feels still erratic       147/89    154/78   146/77   161/80's    2. Are you having any other symptoms (ex. Dizziness, headache, blurred vision, passed out)? Interim  Dizziness  at times tickling cough for a few months when laying down   3. What is your BP issue?  Elevated patient worried too high and family hx cva

## 2020-02-12 NOTE — Telephone Encounter (Signed)
Hx of chronic dizziness, worse in the Am it would seem regardless of BP  She stopped losartan I think 10/2019 , BP was good at that time Would restart losartan 25 in the PM She could move amlodipine to the Am  if she would like (in effort to get rid of dizzy symptoms)

## 2020-02-15 NOTE — Telephone Encounter (Signed)
Left voicemail message to call back  

## 2020-02-16 MED ORDER — LOSARTAN POTASSIUM 25 MG PO TABS: 25.0000 mg | ORAL_TABLET | Freq: Every evening | ORAL | 3 refills | Status: DC

## 2020-02-16 NOTE — Telephone Encounter (Signed)
Spoke with patient and reviewed provider recommendations to start Losartan 25 mg at night and the amlodipine in the morning. She read this back to clarify and requested that she monitor her blood pressure readings as well. She verbalized understanding, agreeable with plan, and had no further questions at this time.

## 2020-04-06 ENCOUNTER — Other Ambulatory Visit: Payer: Self-pay | Admitting: *Deleted

## 2020-04-06 DIAGNOSIS — C3412 Malignant neoplasm of upper lobe, left bronchus or lung: Secondary | ICD-10-CM

## 2020-05-10 ENCOUNTER — Other Ambulatory Visit: Payer: Self-pay

## 2020-05-10 ENCOUNTER — Ambulatory Visit
Admission: RE | Admit: 2020-05-10 | Discharge: 2020-05-10 | Disposition: A | Discharge status: Home / Self Care | Payer: Medicare Other | Source: Ambulatory Visit | Attending: Radiation Oncology | Admitting: Radiation Oncology

## 2020-05-10 DIAGNOSIS — C3412 Malignant neoplasm of upper lobe, left bronchus or lung: Secondary | ICD-10-CM

## 2020-05-10 LAB — POCT I-STAT CREATININE: Creatinine, Ser: 1 mg/dL (ref 0.44–1.00)

## 2020-05-10 MED ORDER — IOHEXOL 300 MG/ML  SOLN
75.0000 mL | Freq: Once | INTRAMUSCULAR | Status: AC | PRN
  Administered 2020-05-10: 75 mL via INTRAVENOUS

## 2020-05-17 ENCOUNTER — Encounter: Payer: Self-pay | Admitting: Radiation Oncology

## 2020-05-17 ENCOUNTER — Other Ambulatory Visit: Payer: Self-pay

## 2020-05-17 ENCOUNTER — Ambulatory Visit
Admission: RE | Admit: 2020-05-17 | Discharge: 2020-05-17 | Disposition: A | Discharge status: Home / Self Care | Payer: Medicare Other | Source: Ambulatory Visit | Attending: Radiation Oncology | Admitting: Radiation Oncology

## 2020-05-17 VITALS — BP 123/74 | HR 73 | Temp 96.1°F | Resp 22 | Wt 137.3 lb

## 2020-05-17 DIAGNOSIS — Z923 Personal history of irradiation: Secondary | ICD-10-CM | POA: Insufficient documentation

## 2020-05-17 DIAGNOSIS — C3412 Malignant neoplasm of upper lobe, left bronchus or lung: Secondary | ICD-10-CM | POA: Insufficient documentation

## 2020-05-17 NOTE — Progress Notes (Signed)
Radiation Oncology Follow up Note  Name: Lauren Hammond   Date:   05/17/2020 MRN:  [id number] DOB: 1931-12-23    This 86 y.o. female presents to the clinic today for over 4-year follow-up status post SBRT to her left upper lobe for stage I adenocarcinoma.  REFERRING PROVIDER: Tracie Harrier, MD  HPI: Patient is now out over 4 years having completed SBRT to her left upper lobe for stage I adenocarcinoma seen today in routine follow-up she is doing fairly well she does complain of fatigue some shortness of breath and dyspnea on exertion..  She had seen Dr. 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  She was put on Anoro although because of the cost she was switched to Combivent respite inhaler and she uses that only occasionally.  She had a CT scan this month showing no progressive findings suggest recurrent or metastatic disease does have some scarring left upper lobe consistent with treatment.  COMPLICATIONS OF TREATMENT: none  FOLLOW UP COMPLIANCE: keeps appointments   PHYSICAL EXAM:  BP 123/74 (BP Location: Left Arm, Patient Position: Sitting)    Pulse 73    Temp (!) 96.1 F (35.6 C) (Tympanic)    Resp (!) 22    Wt 137 lb 4.8 oz (62.3 kg)    BMI 22.50 kg/m  Well-developed well-nourished patient in NAD. HEENT reveals PERLA, EOMI, discs not visualized.  Oral cavity is clear. No oral mucosal lesions are identified. Neck is clear without evidence of cervical or supraclavicular adenopathy. Lungs are clear to A&P. Cardiac examination is essentially unremarkable with regular rate and rhythm without murmur rub or thrill. Abdomen is benign with no organomegaly or masses noted. Motor sensory and DTR levels are equal and symmetric in the upper and lower extremities. Cranial nerves II through XII are grossly intact. Proprioception is intact. No peripheral adenopathy or edema is identified. No motor or sensory levels are noted. Crude visual fields are within  normal range.  RADIOLOGY RESULTS: CT scans reviewed compatible with above-stated findings  PLAN: Present time patient is doing well with no evidence of progressive disease.  I have suggested revisiting the pulmonologist I believe she had a follow-up appointment with Dr. Juliann Mule.  Patient will continue follow-up care with her cardiologist and family practice.  I am turning follow-up care over to them.  She would benefit from a yearly CT scan.  We have to reevaluate her anytime should further consultation be indicated.  I would like to take this opportunity to thank you for allowing me to participate in the care of your patient.Noreene Filbert, MD

## 2020-09-19 ENCOUNTER — Other Ambulatory Visit: Payer: Self-pay | Admitting: Internal Medicine

## 2020-09-19 DIAGNOSIS — R1032 Left lower quadrant pain: Secondary | ICD-10-CM

## 2020-09-30 ENCOUNTER — Other Ambulatory Visit: Payer: Self-pay

## 2020-09-30 ENCOUNTER — Ambulatory Visit
Admission: RE | Admit: 2020-09-30 | Discharge: 2020-09-30 | Disposition: A | Discharge status: Home / Self Care | Payer: Medicare Other | Source: Ambulatory Visit | Attending: Internal Medicine | Admitting: Internal Medicine

## 2020-09-30 DIAGNOSIS — R1032 Left lower quadrant pain: Secondary | ICD-10-CM | POA: Insufficient documentation

## 2020-09-30 LAB — POCT I-STAT CREATININE: Creatinine, Ser: 1 mg/dL (ref 0.44–1.00)

## 2020-09-30 MED ORDER — IOHEXOL 300 MG/ML  SOLN
85.0000 mL | Freq: Once | INTRAMUSCULAR | Status: AC | PRN
  Administered 2020-09-30: 85 mL via INTRAVENOUS

## 2020-11-04 ENCOUNTER — Other Ambulatory Visit: Payer: Self-pay | Admitting: Internal Medicine

## 2020-11-04 DIAGNOSIS — Z1231 Encounter for screening mammogram for malignant neoplasm of breast: Secondary | ICD-10-CM

## 2020-12-20 ENCOUNTER — Ambulatory Visit
Admission: RE | Admit: 2020-12-20 | Discharge: 2020-12-20 | Disposition: A | Discharge status: Home / Self Care | Payer: Medicare Other | Source: Ambulatory Visit | Attending: Internal Medicine | Admitting: Internal Medicine

## 2020-12-20 ENCOUNTER — Other Ambulatory Visit: Payer: Self-pay

## 2020-12-20 DIAGNOSIS — Z1231 Encounter for screening mammogram for malignant neoplasm of breast: Secondary | ICD-10-CM | POA: Insufficient documentation

## 2021-02-06 NOTE — Progress Notes (Signed)
Date:  02/06/2021   ID:  Lauren Hammond, DOB 1932/11/16, MRN [id number]  Patient Location:  Lau[facility] [place] [id number]   Provider location:   [facility], [place] office  PCP:  Tracie Harrier, MD  Cardiologist:  Patsy Baltimore   Chief Complaint  Patient presents with  . Follow-up    Annual follow up and per daughter patient BP has been fluctuating lately, it is either really low or  really high. Patient c/o not being able to breath very good when she is lying dow. Medications verbally reviewed with patient and daughter.     History of Present Illness:    Lauren Hammond is a 87 y.o. female  past medical history of Lung cancer, Underwent radiation therapy for Left upper lobe lesion  Hyperlipidemia Chronic chest left arm pain Depression presentinf for f/u of her dizziness, chest discomfort, hypertension  Seen by myself on tele visit 03/2019 Seen by one of our providers: 10/2019  Presents today with her daughter Couple weeks ago, Pressure 160 , was paying taxes Pressure up and down Better now 130s Gait instability, uses a cane  Seen by pulmonary SOB at night when laying supine Symptoms for 3 years, since she had treatment for her cancer Has inhalers Was told by pulmonary that she has a  "paralyzed diapgragm"  Lost husband last year Daughter present Rare dizziness, chronic issue  Sleep disorder, awake till midnight, difficulty falling asleep, may be nerves low better  Tolerating lovastatin 20 Total cholesterol 180  Recent CT scan chest December 26, 2018 Three-vessel coronary artery calcification Aortic atherosclerosis Emphysema  Prior CV studies:   The following studies were reviewed today:  Echocardiogram January 2017 Ejection fraction 50 to 55%, mild MR   Past Medical History:  Diagnosis Date  . Barrett esophagus   . Depression   . Generalized OA   . HTN (hypertension)   . Hyperlipemia   . Hypothyroid   .  Insomnia   . Lung cancer ([place]) 11/2015   treated with radiation  . Personal history of radiation therapy 2017   lung Ca  . Uterine cancer ([place]) 1981   uterus ca with a hysterectomy   Past Surgical History:  Procedure Laterality Date  . [place], [place]    . BREAST BIOPSY Right 1980's   neg  . KNEE ARTHROSCOPY Right 2000   [place]  . right breast bx       Current Meds  Medication Sig  . amLODipine (NORVASC) 10 MG tablet Take by mouth.  . levothyroxine (SYNTHROID) 100 MCG tablet Take by mouth.  . pantoprazole (PROTONIX) 40 MG tablet Take by mouth.     Allergies:   Atorvastatin, Bupropion, Other, Percodan [oxycodone-aspirin], and Pravastatin   Social History   Tobacco Use  . Smoking status: Never Smoker  . Smokeless tobacco: Never Used  Substance Use Topics  . Alcohol use: No    Alcohol/week: 0.0 standard drinks  . Drug use: No     Current Outpatient Medications on File Prior to Visit  Medication Sig Dispense Refill  . amLODipine (NORVASC) 10 MG tablet Take by mouth.    . levothyroxine (SYNTHROID) 100 MCG tablet Take by mouth.    . pantoprazole (PROTONIX) 40 MG tablet Take by mouth.    Marland Kitchen aspirin EC 81 MG tablet Take by mouth.    . Calcium Carbonate-Vitamin D (OYSTER SHELL CALCIUM 500 +  D) 500-125 MG-UNIT TABS Take by mouth.    . gabapentin (NEURONTIN) 100 MG capsule Take by mouth at bedtime.     Marland Kitchen HYDROcodone-acetaminophen (NORCO/VICODIN) 5-325 MG tablet Take 1 tablet by mouth daily as needed for moderate pain.     Marland Kitchen ibuprofen (ADVIL,MOTRIN) 600 MG tablet Take 600 mg by mouth every 6 (six) hours as needed for moderate pain.     Marland Kitchen losartan (COZAAR) 25 MG tablet Take 1 tablet (25 mg total) by mouth at bedtime. 90 tablet 3  . lovastatin (MEVACOR) 20 MG tablet Take 20 mg by mouth daily.    . meloxicam (MOBIC) 15 MG tablet Take 15 mg by mouth daily.     . Multiple Vitamin (MULTI-VITAMINS) TABS Take 1 tablet by  mouth daily.    Marland Kitchen omeprazole (PRILOSEC) 20 MG capsule Take 40 mg by mouth daily as needed.     . sertraline (ZOLOFT) 100 MG tablet Take 100 mg by mouth daily.    . temazepam (RESTORIL) 15 MG capsule Take 15 mg by mouth at bedtime as needed for sleep.     Marland Kitchen tetrahydrozoline (VISINE) 0.05 % ophthalmic solution Place 1 drop into both eyes.    Marland Kitchen zolpidem (AMBIEN) 5 MG tablet Take 5 mg by mouth at bedtime as needed for sleep. Reported on 04/04/2016     No current facility-administered medications on file prior to visit.     Family Hx: The patient's family history includes Breast cancer in her maternal aunt; Breast cancer (age of onset: 72) in her sister; Hypertension in an other family member.  ROS:   Please see the history of present illness.    Review of Systems  Constitutional: Negative.   Respiratory: Positive for shortness of breath.   Cardiovascular: Negative.   Gastrointestinal: Negative.   Musculoskeletal: Negative.   Neurological: Negative.   Psychiatric/Behavioral: Negative.   All other systems reviewed and are negative.     Labs/Other Tests and Data Reviewed:    Recent Labs: 09/30/2020: Creatinine, Ser 1.00   Recent Lipid Panel No results found for: CHOL, TRIG, HDL, CHOLHDL, LDLCALC, LDLDIRECT  Wt Readings from Last 3 Encounters:  05/17/20 137 lb 4.8 oz (62.3 kg)  11/13/19 139 lb 8 oz (63.3 kg)  07/07/19 141 lb (64 kg)     Exam:    BP 120/70 (BP Location: Left Arm, Patient Position: Sitting, Cuff Size: Normal)   Pulse 68   Ht 5' 5.5" (1.664 m)   Wt 143 lb (64.9 kg)   SpO2 97%   BMI 23.43 kg/m   Constitutional:  oriented to person, place, and time. No distress.  HENT:  Head: Grossly normal Eyes:  no discharge. No scleral icterus.  Neck: No JVD, no carotid bruits  Cardiovascular: Regular rate and rhythm, no murmurs appreciated Pulmonary/Chest: Clear to auscultation bilaterally, no wheezes or rails Abdominal: Soft.  no distension.  no tenderness.   Musculoskeletal: Normal range of motion Neurological:  normal muscle tone. Coordination normal. No atrophy Skin: Skin warm and dry Psychiatric: normal affect, pleasant   ASSESSMENT & PLAN:    Chest pain of uncertain etiology Denies any chest pain, no further work-up at this time Recommended walking program  Dizziness Less likely arrhythmia, Has not had significant symptoms recently  Chronic shortness of breath When supine, was told it was from paralyzed diaphragm Reports that she has Lasix at home but is not taking Recommended she could try 1 or 2 doses over 2 days see if this helps her shortness of  breath.  Would not take diuretic on a regular basis given chronic dizziness  Aortic atherosclerosis (HCC) Seen on CT scan Moderate in intensity throughout We previously add Zetia to her list, she is only on lovastatin on today's visit  Coronary artery calcification coronary calcification and aortic atherosclerosis noted Continue lovastatin, could consider Zetia again  Centrilobular emphysema (HCC) Chronic shortness of breath Worse when supine, recommend she try Lasix as above  Essential hypertension Occasional spikes in blood pressure related to anxiety, recently some anxiety over taxes. Today blood pressure well controlled, at home recently 865H systolic   Total encounter time more than 25 minutes  Greater than 50% was spent in counseling and coordination of care with the patient  Signed, Ida Rogue, MD  02/06/2021 6:31 PM    [facility] [id number], [place], [place] [address]

## 2021-02-07 ENCOUNTER — Encounter: Payer: Self-pay | Admitting: Cardiovascular Disease

## 2021-02-07 ENCOUNTER — Ambulatory Visit: Payer: Medicare Other | Admitting: Cardiovascular Disease

## 2021-02-07 ENCOUNTER — Other Ambulatory Visit: Payer: Self-pay

## 2021-02-07 VITALS — BP 120/70 | HR 68 | Ht 65.5 in | Wt 143.0 lb

## 2021-02-07 DIAGNOSIS — R42 Dizziness and giddiness: Secondary | ICD-10-CM | POA: Diagnosis not present

## 2021-02-07 DIAGNOSIS — I10 Essential (primary) hypertension: Secondary | ICD-10-CM | POA: Diagnosis not present

## 2021-02-07 DIAGNOSIS — I700 Atherosclerosis of aorta: Secondary | ICD-10-CM | POA: Diagnosis not present

## 2021-02-07 DIAGNOSIS — I2510 Atherosclerotic heart disease of native coronary artery without angina pectoris: Secondary | ICD-10-CM | POA: Diagnosis not present

## 2021-02-07 DIAGNOSIS — E782 Mixed hyperlipidemia: Secondary | ICD-10-CM

## 2021-02-07 DIAGNOSIS — R079 Chest pain, unspecified: Secondary | ICD-10-CM | POA: Diagnosis not present

## 2021-02-07 DIAGNOSIS — J432 Centrilobular emphysema: Secondary | ICD-10-CM | POA: Diagnosis not present

## 2021-02-07 DIAGNOSIS — I2584 Coronary atherosclerosis due to calcified coronary lesion: Secondary | ICD-10-CM

## 2021-02-07 NOTE — Patient Instructions (Signed)
Medication Instructions:  No changes  Take lasix sparingly as needed for shortness of breath  Extra losartan for pressure >170 Hold medication for low pressure  If you need a refill on your cardiac medications before your next appointment, please call your pharmacy.    Lab work: No new labs needed   If you have labs (blood work) drawn today and your tests are completely normal, you will receive your results only by: Marland Kitchen MyChart Message (if you have MyChart) OR . A paper copy in the mail If you have any lab test that is abnormal or we need to change your treatment, we will call you to review the results.   Testing/Procedures: No new testing needed   Follow-Up: At [facility], you and your health needs are our priority.  As part of our continuing mission to provide you with exceptional heart care, we have created designated Provider Care Teams.  These Care Teams include your primary Cardiologist (physician) and Advanced Practice Providers (APPs -  Physician Assistants and Nurse Practitioners) who all work together to provide you with the care you need, when you need it.  . You will need a follow up appointment in 12 months  . Providers on your designated Care Team:   . Murray Hodgkins, NP . Christell Faith, PA-C . Marrianne Mood, PA-C  Any Other Special Instructions Will Be Listed Below (If Applicable).  COVID-19 Vaccine Information can be found at: ShippingScam.co.uk For questions related to vaccine distribution or appointments, please email [email or web address] or call [phone].

## 2021-05-09 ENCOUNTER — Other Ambulatory Visit: Payer: Self-pay | Admitting: Internal Medicine

## 2021-05-09 DIAGNOSIS — R2681 Unsteadiness on feet: Secondary | ICD-10-CM

## 2021-05-09 DIAGNOSIS — H539 Unspecified visual disturbance: Secondary | ICD-10-CM

## 2021-05-09 DIAGNOSIS — R413 Other amnesia: Secondary | ICD-10-CM

## 2021-05-23 ENCOUNTER — Other Ambulatory Visit: Payer: Self-pay

## 2021-05-23 ENCOUNTER — Ambulatory Visit
Admission: RE | Admit: 2021-05-23 | Discharge: 2021-05-23 | Disposition: A | Discharge status: Home / Self Care | Payer: Medicare Other | Source: Ambulatory Visit | Attending: Internal Medicine | Admitting: Internal Medicine

## 2021-05-23 DIAGNOSIS — R2681 Unsteadiness on feet: Secondary | ICD-10-CM | POA: Insufficient documentation

## 2021-05-23 DIAGNOSIS — H539 Unspecified visual disturbance: Secondary | ICD-10-CM | POA: Diagnosis present

## 2021-05-23 DIAGNOSIS — R413 Other amnesia: Secondary | ICD-10-CM | POA: Insufficient documentation

## 2022-02-02 ENCOUNTER — Other Ambulatory Visit: Payer: Self-pay | Admitting: Internal Medicine

## 2022-02-02 DIAGNOSIS — Z1231 Encounter for screening mammogram for malignant neoplasm of breast: Secondary | ICD-10-CM

## 2022-03-13 ENCOUNTER — Ambulatory Visit
Admission: RE | Admit: 2022-03-13 | Discharge: 2022-03-13 | Disposition: A | Discharge status: Home / Self Care | Payer: Medicare Other | Source: Ambulatory Visit | Attending: Internal Medicine | Admitting: Internal Medicine

## 2022-03-13 DIAGNOSIS — Z1231 Encounter for screening mammogram for malignant neoplasm of breast: Secondary | ICD-10-CM | POA: Insufficient documentation

## 2022-07-18 ENCOUNTER — Encounter: Payer: Self-pay | Admitting: Cardiology

## 2022-07-18 ENCOUNTER — Ambulatory Visit: Payer: Medicare Other | Admitting: Cardiology

## 2022-07-18 VITALS — BP 142/76 | HR 63 | Ht 65.0 in | Wt 142.2 lb

## 2022-07-18 DIAGNOSIS — I4891 Unspecified atrial fibrillation: Secondary | ICD-10-CM

## 2022-07-18 DIAGNOSIS — R0602 Shortness of breath: Secondary | ICD-10-CM

## 2022-07-18 DIAGNOSIS — I10 Essential (primary) hypertension: Secondary | ICD-10-CM | POA: Diagnosis not present

## 2022-07-18 DIAGNOSIS — E782 Mixed hyperlipidemia: Secondary | ICD-10-CM | POA: Diagnosis not present

## 2022-07-18 DIAGNOSIS — I700 Atherosclerosis of aorta: Secondary | ICD-10-CM

## 2022-07-18 MED ORDER — RIVAROXABAN 20 MG PO TABS: 20.0000 mg | ORAL_TABLET | Freq: Every day | ORAL | 5 refills | Status: DC

## 2022-07-18 NOTE — Patient Instructions (Signed)
Medication Instructions:   Your physician has recommended you make the following change in your medication:    START taking Xarelto 20 MG once a day.  2.    STOP taking your Aspirin.  *If you need a refill on your cardiac medications before your next appointment, please call your pharmacy*    Testing/Procedures:  Your physician has requested that you have an echocardiogram. Echocardiography is a painless test that uses sound waves to create images of your heart. It provides your doctor with information about the size and shape of your heart and how well your heart's chambers and valves are working. This procedure takes approximately one hour. There are no restrictions for this procedure.    Follow-Up: At [facility], you and your health needs are our priority.  As part of our continuing mission to provide you with exceptional heart care, we have created designated Provider Care Teams.  These Care Teams include your primary Cardiologist (physician) and Advanced Practice Providers (APPs -  Physician Assistants and Nurse Practitioners) who all work together to provide you with the care you need, when you need it.  We recommend signing up for the patient portal called "MyChart".  Sign up information is provided on this After Visit Summary.  MyChart is used to connect with patients for Virtual Visits (Telemedicine).  Patients are able to view lab/test results, encounter notes, upcoming appointments, etc.  Non-urgent messages can be sent to your provider as well.   To learn more about what you can do with MyChart, go to [email or web address].    Your next appointment:   1 month(s)  The format for your next appointment:   In Person  Provider:   You may see Ida Rogue, MD or one of the following Advanced Practice Providers on your designated Care Team:   Murray Hodgkins, NP Christell Faith, PA-C Cadence Kathlen Mody, [place]    Other Instructions   Important Information About  [place]

## 2022-07-18 NOTE — Progress Notes (Signed)
Cardiology Clinic Note   Patient Name: Lauren Hammond Date of Encounter: 07/18/2022  Primary Care Provider:  Tracie Harrier, MD Primary Cardiologist:  Ida Rogue, MD  Patient Profile    88 year old female with a past medical history of lung cancer with radiation therapy, hyperlipidemia, chronic chest and left arm pain, essential hypertension, who returns today for follow-up on her hypertension.  Past Medical History    Past Medical History:  Diagnosis Date   Barrett esophagus    Depression    Generalized OA    HTN (hypertension)    Hyperlipemia    Hypothyroid    Insomnia    Lung cancer ([place]) 11/2015   treated with radiation   Personal history of radiation therapy 2017   lung Ca   Uterine cancer ([place]) 1981   uterus ca with a hysterectomy   Past Surgical History:  Procedure Laterality Date   ABDOMINAL HYSTERECTOMY  1984   [place], [place]   APPENDECTOMY     BREAST BIOPSY Right 1980's   neg   KNEE ARTHROSCOPY Right 2000   [place]   right breast bx      Allergies  Allergies  Allergen Reactions   Atorvastatin Other (See Comments)    Muscle Aches   Bupropion Other (See Comments)    Unspecified   Other Other (See Comments)    Unspecified, passed out Reaction: patient was unable to communicate and family member was unable to tell me any allergies    Percodan [Oxycodone-Aspirin]    Pravastatin Other (See Comments)    Muscle Aches    History of Present Illness    88 year old female with a past medical history of lung cancer with radiation therapy, hyperlipidemia, chronic chest and left arm pain, and hypertension.  She has significant lung cancer with a history of radiation therapy for the left upper lobe lesion.  6/19 chest CT of the chest in a bandlike area that was a masslike structure in the anterior left upper lobe was identified, measured 2.3 x 1.2 x 1.0 cm.  The underlying lung lesion was no longer visible.  Pulmonary nodule in the  right lower lobe measures 4 mm and is unchanged from previous exam.  There was new subsegmental atelectasis identified within the left lung base that time.  12/26/2018 on the CT of the chest she had a stable bandlike area of radiation fibrosis in the left upper lobe again no findings suspicious for residual or recurrent tumor.  06/04/2019 she had an echocardiogram which revealed LVEF of 60 to 89%, diastolic Doppler parameters consistent with pseudonormalization, left atrium was mildly dilated, RV size and function was normal, RVSP estimated 30 mmHg, 07/02/2019 she had PFTs that revealed moderate restrictive disease without significant bronchodilator response positive for hyperinflation.  He was last seen in clinic 02/07/2021 by Dr. Rockey Situ at which time she had blood pressure intermittent fluctuating and it was either really high or really low.  Patient complained of not being able to breathe very good when she was lying down.  No changes made to her medications at that time she was just advised to take her Lasix on a regular patient's and Zetia was added to her daily regimen.  She returns to clinic today accompanied by her daughter with fatigue, shortness of breath, and peripheral edema.  She states that she believes that the shortness of breath is unchanged over the last 6 months but the fatigue has increased.  She also notes that her blood pressure  continues to be high at times and low at times depending on how she is feeling for the day.  She denies chest pain or chest pressure.  She denies any recent hospitalizations or visits to the emergency department.  Home Medications    Current Outpatient Medications  Medication Sig Dispense Refill   aspirin EC 81 MG tablet Take by mouth.     Calcium Carbonate-Vitamin D 500-125 MG-UNIT TABS Take by mouth.     HYDROcodone-acetaminophen (NORCO/VICODIN) 5-325 MG tablet Take 1 tablet by mouth daily as needed for moderate pain.      ibuprofen (ADVIL,MOTRIN) 600 MG tablet  Take 600 mg by mouth every 6 (six) hours as needed for moderate pain.      losartan (COZAAR) 25 MG tablet Take 1 tablet (25 mg total) by mouth at bedtime. 90 tablet 3   lovastatin (MEVACOR) 20 MG tablet Take 20 mg by mouth daily.     Multiple Vitamin (MULTI-VITAMINS) TABS Take 1 tablet by mouth daily.     omeprazole (PRILOSEC) 20 MG capsule Take 40 mg by mouth daily as needed.      sertraline (ZOLOFT) 100 MG tablet Take 100 mg by mouth daily.     temazepam (RESTORIL) 15 MG capsule Take 15 mg by mouth at bedtime as needed for sleep.      tetrahydrozoline 0.05 % ophthalmic solution Place 1 drop into both eyes.     No current facility-administered medications for this visit.     Family History    Family History  Problem Relation Age of Onset   Hypertension Other    Breast cancer Maternal Aunt    Breast cancer Sister 65   She indicated that the status of her sister is unknown. She indicated that the status of her maternal aunt is unknown. She indicated that the status of her other is unknown.  Social History    Social History   Socioeconomic History   Marital status: Married    Spouse name: Not on file   Number of children: Not on file   Years of education: Not on file   Highest education level: Not on file  Occupational History   Not on file  Tobacco Use   Smoking status: Never   Smokeless tobacco: Never  Substance and Sexual Activity   Alcohol use: No    Alcohol/week: 0.0 standard drinks of alcohol   Drug use: No   Sexual activity: Not on file  Other Topics Concern   Not on file  Social History Narrative   Not on file   Social Determinants of Health   Financial Resource Strain: Not on file  Food Insecurity: Not on file  Transportation Needs: Not on file  Physical Activity: Not on file  Stress: Not on file  Social Connections: Not on file  Intimate Partner Violence: Not on file     Review of Systems    General:  No chills, fever, night sweats or weight  changes.  Endorses fatigue Cardiovascular:  No chest pain, endorses dyspnea on exertion, endorses edema, orthopnea, palpitations, paroxysmal nocturnal dyspnea. Dermatological: No rash, lesions/masses Respiratory: No cough, endorses dyspnea on exertion Urologic: No hematuria, dysuria Abdominal:   No nausea, vomiting, diarrhea, bright red blood per rectum, melena, or hematemesis Neurologic:  No visual changes, wkns, changes in mental status. All other systems reviewed and are otherwise negative except as noted above.   Physical Exam    VS:  There were no vitals taken for this visit. , BMI There  is no height or weight on file to calculate BMI.     GEN: Well nourished, well developed, in no acute distress. HEENT: normal. Neck: Supple, no JVD, carotid bruits, or masses. Cardiac: Irregularly irregular, no murmurs, rubs, or gallops. No clubbing, cyanosis, 1+ edema.  Radials/DP/PT 2+ and equal bilaterally.  Respiratory:  Respirations regular and unlabored, clear to auscultation bilaterally. GI: Soft, nontender, nondistended, BS + x 4. MS: no deformity or atrophy. Skin: warm and dry, no rash. Neuro:  Strength and sensation are intact. Psych: Normal affect.  Accessory Clinical Findings    ECG personally reviewed by me today-rate controlled atrial fibrillation with a rate of 60-70 with a left axis deviation- No acute changes  Lab Results  Component Value Date   WBC 5.4 04/18/2016   HGB 14.8 04/18/2016   HCT 44.3 04/18/2016   MCV 86.0 04/18/2016   PLT 163 04/18/2016   Lab Results  Component Value Date   CREATININE 1.00 09/30/2020   BUN 16 04/18/2016   NA 138 04/18/2016   K 4.0 04/18/2016   CL 104 04/18/2016   CO2 26 04/18/2016   Lab Results  Component Value Date   ALT 13 (L) 12/01/2015   AST 19 12/01/2015   ALKPHOS 52 12/01/2015   BILITOT 0.5 12/01/2015   No results found for: "CHOL", "HDL", "LDLCALC", "LDLDIRECT", "TRIG", "CHOLHDL"  No results found for:  "HGBA1C"  Assessment & Plan   1.  New onset atrial fibrillation found on EKG today.  Patient is symptomatic with worsening shortness of breath and fatigue.  She is unsure when she converted into atrial fibrillation but according to her daughter they did have a few instances where her home blood pressure cuff read area and was unable to take a blood pressure or heart rate.  She has a longstanding family history of strokes and is concerned of the stroke burden with her being in atrial fibrillation today. Started on Xarelto 20 mg daily to be sent to the pharmacy of choice and discontinuing her aspirin therapy.  CHA2DS2-VASc score of at least 4 (age, sex, hypertension) no beta-blocker was started today as she is currently rate controlled.  Scheduled for an echocardiogram as her last echocardiogram 2019 which showed mildly dilated left atrium.  We did discuss that she would have to take anticoagulation uninterrupted for an entire month and if she remained in atrial fibrillation then we could consider cardioversion with the procedure been explained to the patient and her daughter who accompanies her today.  We also discussed the triggers of her atrial fibrillation with anemia, caffeine, dehydration, anxiety, and fatigue.  2.  Chronic shortness of breath with previously diagnosed paralyzed diaphragm.  She has followed up with pulmonary in the past.  Previously she was taken diuretic therapy but did exacerbated and dizziness so she is no longer taking diuretics.  Echocardiogram has been ordered for new onset atrial fibrillation.  Only regular rhythm could be contributing to some of her shortness of breath with a decrease in cardiac output when being in atrial fibrillation.  3.  Aortic atherosclerosis with coronary artery calcification previously seen on CT scan.  Previously she had been on lovastatin 20 mg daily and was to be on Zetia 10 mg daily.  Today it appears that she is on neither medication.  Recommend  restarting statin and repeating lipid and LFT in 6 weeks.  If continued without statin therapy we will revisit a return appointment.  4.  Essential hypertension with blood pressure today to be  142/76.  She has been having occasional spikes in her blood pressure related to anxiety.  Daughter states that she has also had some drops.  So she is currently taking amlodipine 10 mg daily with losartan 25 mg for systolic blood pressures greater than 150 as she states that she has been told by her PCP.  She requires no refills on either of these medications today.  5.  Hyperlipidemia with LDL of 118 on 05/15/2022.  Patient is currently on no statin medications this is followed by PCP.  6.  Physician patient return to clinic to see MD/APP in 1 month.  Ketra Duchesne, NP 07/18/2022, 8:21 AM

## 2022-08-15 ENCOUNTER — Telehealth: Payer: Self-pay | Admitting: Cardiovascular Disease

## 2022-08-15 NOTE — Telephone Encounter (Signed)
Called Lauren Hammond and left voicemail message.  Spoke with patient and reviewed she has refills remaining and she just needs to pick them up from her pharmacy. Advised that I would be happy to call them to get that ready for her. She was appreciative for the call with no further questions at this time.   Called pharmacy and they have refills on file. They are processing her refill and will get it ready for the patient.

## 2022-08-15 NOTE — Telephone Encounter (Signed)
*  STAT* If patient is at the pharmacy, call can be transferred to refill team.   1. Which medications need to be refilled? (please list name of each medication and dose if known)   rivaroxaban (XARELTO) 20 MG TABS tablet  2. Which pharmacy/location (including street and city if local pharmacy) is medication to be sent to?  [id number] - [place], [place] - [address]  3. Do they need a 30 day or 90 day supply? 30 day  Daughter stated patient only has 1 tablet left.

## 2022-08-16 ENCOUNTER — Ambulatory Visit: Payer: Medicare Other | Attending: Cardiology

## 2022-08-16 DIAGNOSIS — I4891 Unspecified atrial fibrillation: Secondary | ICD-10-CM | POA: Diagnosis not present

## 2022-08-16 LAB — ECHOCARDIOGRAM COMPLETE
AR max vel: 1.5 cm2
AV Area VTI: 1.41 cm2
AV Area mean vel: 1.49 cm2
AV Mean grad: 2.7 mmHg
AV Peak grad: 5.2 mmHg
Ao pk vel: 1.14 m/s
Calc EF: 54.9 %
S' Lateral: 2.8 cm
Single Plane A2C EF: 55.3 %
Single Plane A4C EF: 54.4 %

## 2022-08-17 NOTE — Progress Notes (Signed)
Heart function remains strong at 60-65%, there is mild leakage in the mitral valve and mild to moderate in the tricuspid, no changes from previous study

## 2022-08-29 NOTE — Progress Notes (Unsigned)
Cardiology Clinic Note   Patient Name: Lauren BAUMGARNER Date of Encounter: 08/30/2022  Primary Care Provider:  Tracie Harrier, MD Primary Cardiologist:  Ida Rogue, MD  Patient Profile    88 year old female with a past medical history of lung cancer with radiation therapy, hyperlipidemia, chronic chest and left arm pain, essential hypertension, new onset atrial fibrillation, who returns today for follow-up on her hypertension and new onset atrial fibrillation.  Past Medical History    Past Medical History:  Diagnosis Date   Barrett esophagus    Depression    Generalized OA    HTN (hypertension)    Hyperlipemia    Hypothyroid    Insomnia    Lung cancer ([facility]) 11/2015   treated with radiation   Personal history of radiation therapy 2017   lung Ca   Uterine cancer ([place]) 1981   uterus ca with a hysterectomy   Past Surgical History:  Procedure Laterality Date   ABDOMINAL HYSTERECTOMY  1984   [place], [place]   APPENDECTOMY     BREAST BIOPSY Right 1980's   neg   KNEE ARTHROSCOPY Right 2000   [place]   right breast bx      Allergies  Allergies  Allergen Reactions   Atorvastatin Other (See Comments)    Muscle Aches   Bupropion Other (See Comments)    Unspecified   Other Other (See Comments)    Unspecified, passed out Reaction: patient was unable to communicate and family member was unable to tell me any allergies    Percodan [Oxycodone-Aspirin]    Pravastatin Other (See Comments)    Muscle Aches    History of Present Illness    88 year old female with past medical history mentioned above of lung cancer with radiation therapy, hyperlipidemia, chronic chest and left arm pain, hypertension, and a recent new onset of atrial fibrillation.  She has significant lung cancer with a history of radiation therapy for the left upper lobe lesion.  6/19 chest CT of the chest revealed a bandlike area that had a masslike structure on the anterior left upper  lobe was identified, measuring 3.2 x 1.2 x 1.0 cm.  The underlying lung lesion was no longer visible.  Pulmonary nodule in the right lower lobe measures 4 mm and is unchanged from her previous exams.  There was new subsegmental atelectasis identified within the left lung base.  12/26/2018 on the CT of the chest she had a stable bandlike area of radiation fibrosis in the left upper lobe again no findings suspicious for residual or recurrent tumor.  06/04/2019 she underwent echocardiogram which revealed LVEF of 95-09%, diastolic Doppler pressures consistent with pseudo normalization, left atrium was mildly dilated, RV size and function was normal, RVSP estimated 30 mmHg, 07/02/2019 she had PFTs that revealed moderate restrictive disease without significant bronchodilator response positive for hyperinflation.Marland Kitchen  She was last seen in clinic on 07/18/2022 with complaints of fatigue, shortness of breath, and increasing peripheral edema.  Her shortness of breath that she stated was unchanged over the last 6 months.  EKG revealed new onset atrial fibrillation that was rate controlled.  Her aspirin therapy was discontinued and she was started on anticoagulant for CHA2DS2-VASc score of 4.  Echocardiogram completed 08/16/2022 revealed LVEF 60-65%, no regional wall motion abnormalities, mildly elevated pulmonary artery systolic pressure, left atrial size was mildly dilated, there was mild mitral regurgitation, and mild to moderate tricuspid regurgitation.  She returns to clinic today stating that she has been feeling  fatigued and rundown.  She is also been having issues with constipation and some abdominal pain as of late.  She is accompanied by her daughter today.  She denies any chest pain, shortness of breath, or palpitations.  She states that she does not know when she is in or out of atrial free of.  She has noticed as of late that she has had several dizzy episodes without near-syncope, syncope or falls.  She has continued  on Xarelto since her last appointment without noticing any blood in her urine stool when she blows her nose or brushes her teeth.  Stating that she has tolerated the medication well.  She was never started on beta-blocker therapy as she was rate controlled.  Home Medications    Current Outpatient Medications  Medication Sig Dispense Refill   Calcium Carbonate-Vitamin D 500-125 MG-UNIT TABS Take by mouth.     gabapentin (NEURONTIN) 100 MG capsule Take 1 capsule by mouth as needed.     HYDROcodone-acetaminophen (NORCO/VICODIN) 5-325 MG tablet Take 1 tablet by mouth daily as needed for moderate pain.      levothyroxine (SYNTHROID) 100 MCG tablet Take 100 mcg by mouth daily.     losartan (COZAAR) 25 MG tablet Take 1 tablet (25 mg total) by mouth at bedtime. (Patient taking differently: Take 25 mg by mouth at bedtime. Only takes if blood pressure is up.) 90 tablet 3   Multiple Vitamin (MULTI-VITAMINS) TABS Take 1 tablet by mouth daily.     omeprazole (PRILOSEC) 20 MG capsule Take 40 mg by mouth daily as needed.      rivaroxaban (XARELTO) 20 MG TABS tablet Take 1 tablet (20 mg total) by mouth daily with supper. 30 tablet 5   sertraline (ZOLOFT) 100 MG tablet Take 100 mg by mouth daily.     temazepam (RESTORIL) 15 MG capsule Take 15 mg by mouth at bedtime as needed for sleep.      tetrahydrozoline 0.05 % ophthalmic solution Place 1 drop into both eyes.     VITAMIN D PO Take 1 tablet by mouth daily.     No current facility-administered medications for this visit.     Family History    Family History  Problem Relation Age of Onset   Hypertension Other    Breast cancer Maternal Aunt    Breast cancer Sister 9   She indicated that the status of her sister is unknown. She indicated that the status of her maternal aunt is unknown. She indicated that the status of her other is unknown.  Social History    Social History   Socioeconomic History   Marital status: Married    Spouse name: Not on  file   Number of children: Not on file   Years of education: Not on file   Highest education level: Not on file  Occupational History   Not on file  Tobacco Use   Smoking status: Never   Smokeless tobacco: Never  Vaping Use   Vaping Use: Never used  Substance and Sexual Activity   Alcohol use: No    Alcohol/week: 0.0 standard drinks of alcohol   Drug use: No   Sexual activity: Not on file  Other Topics Concern   Not on file  Social History Narrative   Not on file   Social Determinants of Health   Financial Resource Strain: Not on file  Food Insecurity: Not on file  Transportation Needs: Not on file  Physical Activity: Not on file  Stress: Not  on file  Social Connections: Not on file  Intimate Partner Violence: Not on file     Review of Systems    General:  No chills, fever, night sweats or weight changes.  Endorses fatigue Cardiovascular:  No chest pain, dyspnea on exertion, edema, orthopnea, palpitations, paroxysmal nocturnal dyspnea. Dermatological: No rash, lesions/masses Respiratory: No cough, dyspnea Urologic: No hematuria, dysuria Abdominal:   No nausea, vomiting, diarrhea, bright red blood per rectum, melena, or hematemesis endorses abdominal discomfort and constipation Neurologic:  No visual changes, wkns, changes in mental status.  Endorses dizziness and lightheadedness All other systems reviewed and are otherwise negative except as noted above.     Physical Exam    VS:  BP 120/64 (BP Location: Left Arm, Patient Position: Sitting, Cuff Size: Normal)   Pulse 78   Ht 5\' 5"  (1.651 m)   Wt 140 lb 4 oz (63.6 kg)   BMI 23.34 kg/m  , BMI Body mass index is 23.34 kg/m.    Orthostatic VS for the past 24 hrs (Last 3 readings):  BP- Lying Pulse- Lying BP- Sitting Pulse- Sitting BP- Standing at 0 minutes Pulse- Standing at 0 minutes BP- Standing at 3 minutes Pulse- Standing at 3 minutes  08/30/22 1127 130/77 87 109/68 96 115/67 108 114/77 101    GEN: Well  nourished, well developed, in no acute distress. HEENT: normal. Neck: Supple, no JVD, carotid bruits, or masses. Cardiac: Irregularly irregular, no murmurs, rubs, or gallops. No clubbing, cyanosis, trace pretibial edema.  Radials/DP/PT 2+ and equal bilaterally.  Respiratory:  Respirations regular and unlabored, clear to auscultation bilaterally. GI: Soft, nontender, nondistended, BS + x 4. MS: no deformity or atrophy. Skin: warm and dry, no rash. Neuro:  Strength and sensation are intact. Psych: Normal affect.  Accessory Clinical Findings    ECG personally reviewed by me today-rate controlled atrial fibrillation with a left axis deviation- No acute changes  Lab Results  Component Value Date   WBC 5.4 04/18/2016   HGB 14.8 04/18/2016   HCT 44.3 04/18/2016   MCV 86.0 04/18/2016   PLT 163 04/18/2016   Lab Results  Component Value Date   CREATININE 1.00 09/30/2020   BUN 16 04/18/2016   NA 138 04/18/2016   K 4.0 04/18/2016   CL 104 04/18/2016   CO2 26 04/18/2016   Lab Results  Component Value Date   ALT 13 (L) 12/01/2015   AST 19 12/01/2015   ALKPHOS 52 12/01/2015   BILITOT 0.5 12/01/2015   No results found for: "CHOL", "HDL", "LDLCALC", "LDLDIRECT", "TRIG", "CHOLHDL"  No results found for: "HGBA1C"  Assessment & Plan   1.  New onset atrial fibrillation.  Patient continues to have complaints of fatigue, dizziness, lightheadedness, and overall just feeling rundown.  At her previous appointment there had been discussion of elective cardioversion.  She was to return today to decide if that is something that she would want to pursue.  She continues to be asymptomatic to the atrial fibrillation at this time.  She has been on Xarelto uninterrupted for minimum of 4 weeks.  She was started on that for CHA2DS2-VASc score of at least 11 (age, sex, hypertension) she was not placed on a beta-blocker therapy if she was rate controlled and was already have an and labile blood pressures.  She  did undergo echocardiogram with an LVEF of 60-65%, mild left ventricular hypertrophy, mildly elevated pulmonary artery systolic pressures, left atrial size is mildly dilated, there is mild mitral regurgitation and mild to  moderate tricuspid regurgitation.  We will discuss cardioversion at length and she would like to follow-up with her primary care provider prior to having any procedures done for the complaints of constipation and abdominal discomfort she is having.  She and her daughter believes she may be starting to have a bout of diverticulitis.  Advised her to go ahead and call her PCP and make follow-up so they can do blood work and a possible FOBT.  Patient and daughter both discussed possible cardioversion at length and will call back if she would like to undergo.  Also offered A-fib clinic to them at this time with other potential options of medication versus direct-current cardioversion.  Patient will follow up with her PCP and let us know after that appointment.  2.  Chronic shortness of breath was previously diagnosed paralyzed diaphragm.  She has followed up with pulmonary in the past for this.  Previously she has taken diuretic therapy but did exacerbate the dizziness so she is no longer taking diuretics.  She states that her shortness of breath is unchanged from prior visit.  3.  Atherosclerosis of coronary artery calcification previously seen on CT scan.  Previously she had been on lovastatin 20 mg and was on Zetia 10 mg daily.  Continues to be on statin therapy today.  We will need to reinitiate and recheck lipid and LFT in 6 weeks.  4.  Essential hypertension with small orthostatic hypotension noted today blood pressure was 120/64 patient complained of dizziness on lying and blood pressure was 130/77 with pulse of 87, sitting 109/69 with pulse of 96, standing 115/67 with a pulse of 108, sitting again 114/77 with a pulse of 101.  She currently only takes on losartan 25 mg daily when her  systolic blood pressures greater than 150.  She previously been on Norvasc 10 mg with orthostasis today Norvasc is decreased to 5 mg daily.  She requires refills on either of these today.  5.  Hyperlipidemia with LDL of 118 on 05/15/2022 patient is currently not on statin medications this continues to be followed by her PCP  6.  Disposition patient return to clinic in 4 weeks to see MD/APP or sooner if needed.  Patient is to talk with her daughter and if they decide after seeing her PCP to undergo DCCV she will call the office and notify us at that time. Quinterious Walraven, NP 08/30/2022, 12:10 PM

## 2022-08-30 ENCOUNTER — Encounter: Payer: Self-pay | Admitting: Cardiology

## 2022-08-30 ENCOUNTER — Ambulatory Visit: Payer: Medicare Other | Attending: Cardiology | Admitting: Cardiology

## 2022-08-30 VITALS — BP 120/64 | HR 78 | Ht 65.0 in | Wt 140.2 lb

## 2022-08-30 DIAGNOSIS — E782 Mixed hyperlipidemia: Secondary | ICD-10-CM

## 2022-08-30 DIAGNOSIS — I10 Essential (primary) hypertension: Secondary | ICD-10-CM

## 2022-08-30 DIAGNOSIS — R0602 Shortness of breath: Secondary | ICD-10-CM | POA: Diagnosis not present

## 2022-08-30 DIAGNOSIS — I2584 Coronary atherosclerosis due to calcified coronary lesion: Secondary | ICD-10-CM

## 2022-08-30 DIAGNOSIS — I4891 Unspecified atrial fibrillation: Secondary | ICD-10-CM

## 2022-08-30 DIAGNOSIS — I2510 Atherosclerotic heart disease of native coronary artery without angina pectoris: Secondary | ICD-10-CM

## 2022-08-30 MED ORDER — AMLODIPINE BESYLATE 5 MG PO TABS: 5.0000 mg | ORAL_TABLET | Freq: Every day | ORAL | 3 refills | Status: DC

## 2022-08-30 NOTE — Patient Instructions (Signed)
Medication Instructions:   DECREASE Amlodipine 5mg  - take one tablet by mouth daily.  - if you have any of the 10mg  tablets you can cut those in half.   *If you need a refill on your cardiac medications before your next appointment, please call your pharmacy*   Lab Work:  None Ordered  If you have labs (blood work) drawn today and your tests are completely normal, you will receive your results only by: [place] (if you have MyChart) OR A paper copy in the mail If you have any lab test that is abnormal or we need to change your treatment, we will call you to review the results.   Testing/Procedures:  None Ordered   Follow-Up: At [facility], you and your health needs are our priority.  As part of our continuing mission to provide you with exceptional heart care, we have created designated Provider Care Teams.  These Care Teams include your primary Cardiologist (physician) and Advanced Practice Providers (APPs -  Physician Assistants and Nurse Practitioners) who all work together to provide you with the care you need, when you need it.  We recommend signing up for the patient portal called "MyChart".  Sign up information is provided on this After Visit Summary.  MyChart is used to connect with patients for Virtual Visits (Telemedicine).  Patients are able to view lab/test results, encounter notes, upcoming appointments, etc.  Non-urgent messages can be sent to your provider as well.   To learn more about what you can do with MyChart, go to [email or web address].    Your next appointment:   1 month(s)  The format for your next appointment:   In Person  Provider:   You may see Ida Rogue, MD or one of the following Advanced Practice Providers on your designated Care Team:   Murray Hodgkins, NP Christell Faith, PA-C Cadence Kathlen Mody, PA-C Gerrie Nordmann, NP

## 2022-08-31 ENCOUNTER — Telehealth: Payer: Self-pay | Admitting: Cardiology

## 2022-08-31 NOTE — Telephone Encounter (Signed)
  Pt's daughter calling to set the pt's procedure, she thinks its for cardioversion. She wanted to ask some questions first then will schedule a date

## 2022-08-31 NOTE — Telephone Encounter (Signed)
We discussed a cardioversion vs a referral to atrial fibrillation clinic. She had her daughter with her and they were going to call back and let us know what they wanted to do after she had her appointment with her PCP.

## 2022-09-03 ENCOUNTER — Encounter: Payer: Self-pay | Admitting: *Deleted

## 2022-09-03 DIAGNOSIS — I4891 Unspecified atrial fibrillation: Secondary | ICD-10-CM

## 2022-09-03 DIAGNOSIS — Z01812 Encounter for preprocedural laboratory examination: Secondary | ICD-10-CM

## 2022-09-03 NOTE — Telephone Encounter (Signed)
Spoke with patients daughter per release form. Reviewed instructions for her procedure to include date and time. She requested that I please mail instructions to both patient and her to have for review. Her address is as follows:  [address] [address] [place], [place] [address]  In regards to the labs the patient is due for repeat labs with her PCP. She will call to see if they can reschedule labs so that they will be done in time for her procedure. Once she confirms date and time I will call provider office to review what labs we need and provide them with our fax number.    You are scheduled for a Cardioversion on 09/20/22 with Dr. Rockey Situ Please arrive at the [facility] at 06:30 a.m. on the day of your procedure.  DIET INSTRUCTIONS:  Nothing to eat or drink after midnight except your medications with a small sip of water.         Labs: CBC & BMP  Medications:  YOU MAY TAKE ALL of your medications with a small amount of water.  Must have a responsible person to drive you home.  Bring a current list of your medications and current insurance cards.    If you have any questions after you get home, please call the office at [phone]

## 2022-09-03 NOTE — Telephone Encounter (Signed)
Patients daughter called back and moved her appointment for labs to be done the week before her procedure. Will call her PCP office to let them know which labs we need and provide our fax number to send those to [place]. She was appreciative for the call back with no further questions at this time.

## 2022-09-03 NOTE — Telephone Encounter (Signed)
Spoke with PCP office and they have her down to get CBC and CMP. Requested they fax [place] the results and she took that number. No further needs.

## 2022-09-04 ENCOUNTER — Other Ambulatory Visit: Payer: Self-pay | Admitting: Cardiology

## 2022-09-04 DIAGNOSIS — I4891 Unspecified atrial fibrillation: Secondary | ICD-10-CM

## 2022-09-04 NOTE — H&P (View-Only) (Signed)
Cardiology Admission History and Physical   Patient ID: JAVON HUPFER MRN: [id number]; DOB: [birth year]   Admission date: (Not on file)  PCP:  Tracie Harrier, [place] [facility] Cardiologist:  Ida Rogue, MD        Chief Complaint: New-onset atrial fibrillation  Patient Profile:   Lauren Hammond is a 88 y.o. female with history of lung cancer with radiation therapy, hyperlipidemia, chronic chest and left arm pain, essential hypertension, and new onset atrial fibrillation who is being seen 09/04/2022 for the evaluation of new onset atrial fibrillation.  History of Present Illness:   Lauren Hammond is a 88 year old female with past medical history mentioned above of lung cancer with radiation therapy, hyperlipidemia, chronic chest and left arm pain, hypertension and a recent new onset atrial fibrillation.  This is a significant lung history of radiation to the left upper lobe lesion.  619 chest CT of the chest revealed bilateral that had a masslike structure on the anterior left upper lobe.  Measuring 3.2 x 1.2 x 1.0 cm.  The underlying lung lesion was no longer visible.  Pulmonary nodule in the right lower lobe measures 4 mm unchanged from previous exams.  There was new subsegmental atelectasis.  Bilateral within the left lung base.  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  06/04/2019 patient with echocardiogram which revealed LVEF 60 to 65%, diagnostic Doppler pressures consistent with pseudonormalization, left atrium was mildly dilated, RV size and function was normal, RVSP estimated 30 mmHg, 07/02/2019 she had PFTs that revealed moderate restrictive disease without significant bronchodilator response positive for hyperinflation.  Clinic visit 8/23/patient complains of fatigue, shortness of breath, increased peripheral edema.  Her shortness of breath is unchanged over the  last 6 months.  EKG revealed new onset atrial fibrillation was rate controlled.  Respiratory was discontinued and she was started on anticoagulant for a CHADS-VASc score of at least 4.  Echocardiogram completed 08/16/2022 revealed LVEF 60 to 65% no regional wall motion abnormalities, mildly elevated pulmonary artery systolic pressure, left atrial size is mildly dilated, there was mild mitral regurgitation, mild to moderate tricuspid regurgitation.  Patient was last seen in clinic on 08/30/2022 and was still having complaints of fatigue and feeling rundown.  She was also accompanied by her daughter extensive conversation about cardioversion versus referral to A-fib/EP.  She was continued on Xarelto has been uninterrupted for the past month.  She has not required any beta-blockers that she has been rate controlled.  She is here today to discuss elective cardioversion procedure to be completed at [facility] on 09/20/2022. Past Medical History:  Diagnosis Date   Barrett esophagus    Depression    Generalized OA    HTN (hypertension)    Hyperlipemia    Hypothyroid    Insomnia    Lung cancer ([place]) 11/2015   treated with radiation   Personal history of radiation therapy 2017   lung Ca   Uterine cancer ([place]) 1981   uterus ca with a hysterectomy    Past Surgical History:  Procedure Laterality Date   ABDOMINAL HYSTERECTOMY  1984   [place], [place]   APPENDECTOMY     BREAST BIOPSY Right 1980's   neg   KNEE ARTHROSCOPY Right 2000   [place]   right breast bx       Medications Prior to Admission: Prior to Admission medications   Medication Sig Start  Date End Date Taking? Authorizing Provider  amLODipine (NORVASC) 5 MG tablet Take 1 tablet (5 mg total) by mouth daily. 08/30/22 08/25/23  Gerrie Nordmann, NP  Calcium Carbonate-Vitamin D 500-125 MG-UNIT TABS Take by mouth.    [provider]  gabapentin (NEURONTIN) 100 MG capsule Take 1 capsule by mouth as needed. 01/17/22   [provider]  HYDROcodone-acetaminophen (NORCO/VICODIN) 5-325 MG tablet Take 1 tablet by mouth daily as needed for moderate pain.     [provider]  levothyroxine (SYNTHROID) 100 MCG tablet Take 100 mcg by mouth daily. 05/22/22 05/22/23  [provider]  losartan (COZAAR) 25 MG tablet Take 1 tablet (25 mg total) by mouth at bedtime. Patient taking differently: Take 25 mg by mouth at bedtime. Only takes if blood pressure is up. 02/16/20   Gollan, Kathlene November, MD  Multiple Vitamin (MULTI-VITAMINS) TABS Take 1 tablet by mouth daily.    [provider]  omeprazole (PRILOSEC) 20 MG capsule Take 40 mg by mouth daily as needed.     [provider]  rivaroxaban (XARELTO) 20 MG TABS tablet Take 1 tablet (20 mg total) by mouth daily with supper. 07/18/22   Gerrie Nordmann, NP  sertraline (ZOLOFT) 100 MG tablet Take 100 mg by mouth daily.    [provider]  temazepam (RESTORIL) 15 MG capsule Take 15 mg by mouth at bedtime as needed for sleep.  03/21/16   [provider]  tetrahydrozoline 0.05 % ophthalmic solution Place 1 drop into both eyes.    [provider]  VITAMIN D PO Take 1 tablet by mouth daily.    [provider]     Allergies:    Allergies  Allergen Reactions   Atorvastatin Other (See Comments)    Muscle Aches   Bupropion Other (See Comments)    Unspecified   Other Other (See Comments)    Unspecified, passed out Reaction: patient was unable to communicate and family member was unable to tell me any allergies    Percodan [Oxycodone-Aspirin]    Pravastatin Other (See Comments)    Muscle Aches    Social History:   Social History   Socioeconomic History   Marital status: Married    Spouse name: Not on file   Number of children: Not on file   Years of education: Not on file   Highest education level: Not on file  Occupational History   Not on file  Tobacco Use   Smoking status: Never   Smokeless tobacco: Never   Vaping Use   Vaping Use: Never used  Substance and Sexual Activity   Alcohol use: No    Alcohol/week: 0.0 standard drinks of alcohol   Drug use: No   Sexual activity: Not on file  Other Topics Concern   Not on file  Social History Narrative   Not on file   Social Determinants of Health   Financial Resource Strain: Not on file  Food Insecurity: Not on file  Transportation Needs: Not on file  Physical Activity: Not on file  Stress: Not on file  Social Connections: Not on file  Intimate Partner Violence: Not on file    Family History:   The patient's family history includes Breast cancer in her maternal aunt; Breast cancer (age of onset: 64) in her sister; Hypertension in an other family member.    ROS:  Please see the history of present illness.  Review of Systems  Constitutional:  Positive for malaise/fatigue.  Respiratory:  Positive  for shortness of breath.   Cardiovascular:  Positive for leg swelling.  Gastrointestinal:  Positive for abdominal pain.  Neurological:  Positive for weakness.   All other ROS reviewed and negative.     Physical Exam/Data:  There were no vitals filed for this visit. @IOBRIEF @    08/30/2022   11:16 AM 07/18/2022    2:42 PM 02/07/2021   11:52 AM  Last 3 Weights  Weight (lbs) 140 lb 4 oz 142 lb 3.2 oz 143 lb  Weight (kg) 63.617 kg 64.501 kg 64.864 kg     There is no height or weight on file to calculate BMI.  General:  Well nourished, well developed, in no acute distress HEENT: normal Neck: no JVD Vascular: No carotid bruits; Distal pulses 2+ bilaterally   Cardiac:  normal S1, S2; irregularly irregular; no murmur  Lungs:  clear to auscultation bilaterally, no wheezing, rhonchi or rales  Abd: soft, nontender, no hepatomegaly  Ext: no edema Musculoskeletal:  No deformities, BUE and BLE strength normal and equal Skin: warm and dry  Neuro:  CNs 2-12 intact, no focal abnormalities noted Psych:  Normal affect    EKG:  The ECG that was  done 08/30/22 was personally reviewed and demonstrates rate controlled atrial fibrillation with a left axis deviation  Relevant CV Studies: TTE 08/16/22 1. Left ventricular ejection fraction, by estimation, is 60 to 65%. The  left ventricle has normal function. The left ventricle has no regional  wall motion abnormalities. There is mild asymmetric left ventricular  hypertrophy of the basal-septal segment.  Left ventricular diastolic parameters are indeterminate.   2. Right ventricular systolic function is normal. The right ventricular  size is normal. There is mildly elevated pulmonary artery systolic  pressure. The estimated right ventricular systolic pressure is 62.8 mmHg.   3. Left atrial size was mildly dilated.   4. The mitral valve is normal in structure. Mild mitral valve  regurgitation. No evidence of mitral stenosis.   5. Tricuspid valve regurgitation is mild to moderate.   6. The aortic valve is normal in structure. Aortic valve regurgitation is  not visualized. No aortic stenosis is present.   7. The inferior vena cava is normal in size with greater than 50%  respiratory variability, suggesting right atrial pressure of 3 mmHg.   Laboratory Data:  High Sensitivity Troponin:  No results for input(s): "TROPONINIHS" in the last 720 hours.    ChemistryNo results for input(s): "NA", "K", "CL", "CO2", "GLUCOSE", "BUN", "CREATININE", "CALCIUM", "MG", "GFRNONAA", "GFRAA", "ANIONGAP" in the last 168 hours.  No results for input(s): "PROT", "ALBUMIN", "AST", "ALT", "ALKPHOS", "BILITOT" in the last 168 hours. Lipids No results for input(s): "CHOL", "TRIG", "HDL", "LABVLDL", "LDLCALC", "CHOLHDL" in the last 168 hours. HematologyNo results for input(s): "WBC", "RBC", "HGB", "HCT", "MCV", "MCH", "MCHC", "RDW", "PLT" in the last 168 hours. Thyroid No results for input(s): "TSH", "FREET4" in the last 168 hours. BNPNo results for input(s): "BNP", "PROBNP" in the last 168 hours.  DDimer No  results for input(s): "DDIMER" in the last 168 hours.   Radiology/Studies:  No results found.   Assessment and Plan:   New onset atrial fibrillation patient with continued complaints of fatigue, dizziness, lightheadedness, feeling rundown.  She has been on Xarelto uninterrupted for minimum of 4 weeks.  She was started on that for CHA2DS2-VASc of at least 76 (age, sex, hypertension) she was not placed on beta-blocker therapy as she is rate controlled and already had in the past labile blood pressures.  She did undergo echocardiogram with LVEF of 60 to 65%, mild ventricular hypertrophy, mildly elevated pulmonary artery systolic pressures, left atrial size is mildly dilated, there is mild mitral regurgitation, mild to moderate tricuspid regurgitation.  We discussed cardioversion at length with the patient and her daughter.  She has a upcoming appointment with her PCP for some abdominal discomfort and her daughter was called by again stated that they would like to undergo the cardioversion procedure with Dr. Rockey Situ and have her blood work done by her PCP. Chronic shortness of breath with previously diagnosed paralyzed diaphragm.  She follows up with pulmonary in the past for this.  Previously she has taken diuretic therapy but it exacerbates the dizziness and she is no longer on diuretics.  She states her shortness of breath is unchanged from previous visits. Atherosclerosis of the coronary arteries with calcification previously seen on CT scan.  Previously she was on lovastatin 20 mg and Zetia 10 mg daily.  Continues to be statin therapy.  We will need to recheck lipid and LFT in 6 weeks. Essential hypertension with occasional orthostatic hypotension noted today blood pressures 120/64 patient complains of dizziness orthostatics were completed.  She was continued on losartan 25 mg daily if systolic blood pressures greater than 150 only.  She was previously on Norvasc 10 mg with her continued orthostasis her  Norvasc was decreased to 5 mg daily. 5.   Hyperlipidemia with LDL of 118 on 05/15/2022 patient is currently not taking herstatin   medication but continues to follow-up with her PCP she will need to restart these for   secondary prevention.   Risk Assessment/Risk Scores:         CHA2DS2-VASc Score = 4   This indicates a 4.8% annual risk of stroke. The patient's score is based upon: CHF History: 0 HTN History: 1 Diabetes History: 0 Stroke History: 0 Vascular Disease History: 0 Age Score: 2 Gender Score: 1    Shared Decision Making/Informed Consent The risks (stroke, cardiac arrhythmias rarely resulting in the need for a temporary or permanent pacemaker, skin irritation or burns and complications associated with conscious sedation including aspiration, arrhythmia, respiratory failure and death), benefits (restoration of normal sinus rhythm) and alternatives of a direct current cardioversion were explained in detail to Ms. Fiscus and she agrees to proceed.    For questions or updates, please contact [place] Please consult [email or web address] for contact info under     Signed, Denisse Whitenack, NP  09/04/2022 7:56 AM

## 2022-09-04 NOTE — Progress Notes (Signed)
Cardiology Admission History and Physical   Patient ID: DAJA SHUPING MRN: [id number]; DOB: [birth year]   Admission date: (Not on file)  PCP:  Tracie Harrier, [place] Prov[facility] Cardiologist:  Ida Rogue, MD        Chief Complaint: New-onset atrial fibrillation  Patient Profile:   Lauren Hammond is a 88 y.o. female with history of lung cancer with radiation therapy, hyperlipidemia, chronic chest and left arm pain, essential hypertension, and new onset atrial fibrillation who is being seen 09/04/2022 for the evaluation of new onset atrial fibrillation.  History of Present Illness:   Ms. Lauren Hammond is a 88 year old female with past medical history mentioned above of lung cancer with radiation therapy, hyperlipidemia, chronic chest and left arm pain, hypertension and a recent new onset atrial fibrillation.  This is a significant lung history of radiation to the left upper lobe lesion.  619 chest CT of the chest revealed bilateral that had a masslike structure on the anterior left upper lobe.  Measuring 3.2 x 1.2 x 1.0 cm.  The underlying lung lesion was no longer visible.  Pulmonary nodule in the right lower lobe measures 4 mm unchanged from previous exams.  There was new subsegmental atelectasis.  Bilateral within the left lung base.  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  06/04/2019 patient with echocardiogram which revealed LVEF 60 to 65%, diagnostic Doppler pressures consistent with pseudonormalization, left atrium was mildly dilated, RV size and function was normal, RVSP estimated 30 mmHg, 07/02/2019 she had PFTs that revealed moderate restrictive disease without significant bronchodilator response positive for hyperinflation.  Clinic visit 8/23/patient complains of fatigue, shortness of breath, increased peripheral edema.  Her shortness of breath is unchanged over the  last 6 months.  EKG revealed new onset atrial fibrillation was rate controlled.  Respiratory was discontinued and she was started on anticoagulant for a CHADS-VASc score of at least 4.  Echocardiogram completed 08/16/2022 revealed LVEF 60 to 65% no regional wall motion abnormalities, mildly elevated pulmonary artery systolic pressure, left atrial size is mildly dilated, there was mild mitral regurgitation, mild to moderate tricuspid regurgitation.  Patient was last seen in clinic on 08/30/2022 and was still having complaints of fatigue and feeling rundown.  She was also accompanied by her daughter extensive conversation about cardioversion versus referral to A-fib/EP.  She was continued on Xarelto has been uninterrupted for the past month.  She has not required any beta-blockers that she has been rate controlled.  She is here today to discuss elective cardioversion procedure to be completed at [facility] on 09/20/2022. Past Medical History:  Diagnosis Date   Barrett esophagus    Depression    Generalized OA    HTN (hypertension)    Hyperlipemia    Hypothyroid    Insomnia    Lung cancer ([place]) 11/2015   treated with radiation   Personal history of radiation therapy 2017   lung Ca   Uterine cancer ([place]) 1981   uterus ca with a hysterectomy    Past Surgical History:  Procedure Laterality Date   ABDOMINAL HYSTERECTOMY  1984   [place], [place]   APPENDECTOMY     BREAST BIOPSY Right 1980's   neg   KNEE ARTHROSCOPY Right 2000   [place]   right breast bx       Medications Prior to Admission: Prior to Admission medications   Medication Sig Start  Date End Date Taking? Authorizing Provider  amLODipine (NORVASC) 5 MG tablet Take 1 tablet (5 mg total) by mouth daily. 08/30/22 08/25/23  Gerrie Nordmann, NP  Calcium Carbonate-Vitamin D 500-125 MG-UNIT TABS Take by mouth.    [provider]  gabapentin (NEURONTIN) 100 MG capsule Take 1 capsule by mouth as needed. 01/17/22   [provider]  HYDROcodone-acetaminophen (NORCO/VICODIN) 5-325 MG tablet Take 1 tablet by mouth daily as needed for moderate pain.     [provider]  levothyroxine (SYNTHROID) 100 MCG tablet Take 100 mcg by mouth daily. 05/22/22 05/22/23  [provider]  losartan (COZAAR) 25 MG tablet Take 1 tablet (25 mg total) by mouth at bedtime. Patient taking differently: Take 25 mg by mouth at bedtime. Only takes if blood pressure is up. 02/16/20   Gollan, Kathlene November, MD  Multiple Vitamin (MULTI-VITAMINS) TABS Take 1 tablet by mouth daily.    [provider]  omeprazole (PRILOSEC) 20 MG capsule Take 40 mg by mouth daily as needed.     [provider]  rivaroxaban (XARELTO) 20 MG TABS tablet Take 1 tablet (20 mg total) by mouth daily with supper. 07/18/22   Gerrie Nordmann, NP  sertraline (ZOLOFT) 100 MG tablet Take 100 mg by mouth daily.    [provider]  temazepam (RESTORIL) 15 MG capsule Take 15 mg by mouth at bedtime as needed for sleep.  03/21/16   [provider]  tetrahydrozoline 0.05 % ophthalmic solution Place 1 drop into both eyes.    [provider]  VITAMIN D PO Take 1 tablet by mouth daily.    [provider]     Allergies:    Allergies  Allergen Reactions   Atorvastatin Other (See Comments)    Muscle Aches   Bupropion Other (See Comments)    Unspecified   Other Other (See Comments)    Unspecified, passed out Reaction: patient was unable to communicate and family member was unable to tell me any allergies    Percodan [Oxycodone-Aspirin]    Pravastatin Other (See Comments)    Muscle Aches    Social History:   Social History   Socioeconomic History   Marital status: Married    Spouse name: Not on file   Number of children: Not on file   Years of education: Not on file   Highest education level: Not on file  Occupational History   Not on file  Tobacco Use   Smoking status: Never   Smokeless tobacco: Never   Vaping Use   Vaping Use: Never used  Substance and Sexual Activity   Alcohol use: No    Alcohol/week: 0.0 standard drinks of alcohol   Drug use: No   Sexual activity: Not on file  Other Topics Concern   Not on file  Social History Narrative   Not on file   Social Determinants of Health   Financial Resource Strain: Not on file  Food Insecurity: Not on file  Transportation Needs: Not on file  Physical Activity: Not on file  Stress: Not on file  Social Connections: Not on file  Intimate Partner Violence: Not on file    Family History:   The patient's family history includes Breast cancer in her maternal aunt; Breast cancer (age of onset: 71) in her sister; Hypertension in an other family member.    ROS:  Please see the history of present illness.  Review of Systems  Constitutional:  Positive for malaise/fatigue.  Respiratory:  Positive  for shortness of breath.   Cardiovascular:  Positive for leg swelling.  Gastrointestinal:  Positive for abdominal pain.  Neurological:  Positive for weakness.   All other ROS reviewed and negative.     Physical Exam/Data:  There were no vitals filed for this visit. @IOBRIEF @    08/30/2022   11:16 AM 07/18/2022    2:42 PM 02/07/2021   11:52 AM  Last 3 Weights  Weight (lbs) 140 lb 4 oz 142 lb 3.2 oz 143 lb  Weight (kg) 63.617 kg 64.501 kg 64.864 kg     There is no height or weight on file to calculate BMI.  General:  Well nourished, well developed, in no acute distress HEENT: normal Neck: no JVD Vascular: No carotid bruits; Distal pulses 2+ bilaterally   Cardiac:  normal S1, S2; irregularly irregular; no murmur  Lungs:  clear to auscultation bilaterally, no wheezing, rhonchi or rales  Abd: soft, nontender, no hepatomegaly  Ext: no edema Musculoskeletal:  No deformities, BUE and BLE strength normal and equal Skin: warm and dry  Neuro:  CNs 2-12 intact, no focal abnormalities noted Psych:  Normal affect    EKG:  The ECG that was  done 08/30/22 was personally reviewed and demonstrates rate controlled atrial fibrillation with a left axis deviation  Relevant CV Studies: TTE 08/16/22 1. Left ventricular ejection fraction, by estimation, is 60 to 65%. The  left ventricle has normal function. The left ventricle has no regional  wall motion abnormalities. There is mild asymmetric left ventricular  hypertrophy of the basal-septal segment.  Left ventricular diastolic parameters are indeterminate.   2. Right ventricular systolic function is normal. The right ventricular  size is normal. There is mildly elevated pulmonary artery systolic  pressure. The estimated right ventricular systolic pressure is 95.2 mmHg.   3. Left atrial size was mildly dilated.   4. The mitral valve is normal in structure. Mild mitral valve  regurgitation. No evidence of mitral stenosis.   5. Tricuspid valve regurgitation is mild to moderate.   6. The aortic valve is normal in structure. Aortic valve regurgitation is  not visualized. No aortic stenosis is present.   7. The inferior vena cava is normal in size with greater than 50%  respiratory variability, suggesting right atrial pressure of 3 mmHg.   Laboratory Data:  High Sensitivity Troponin:  No results for input(s): "TROPONINIHS" in the last 720 hours.    ChemistryNo results for input(s): "NA", "K", "CL", "CO2", "GLUCOSE", "BUN", "CREATININE", "CALCIUM", "MG", "GFRNONAA", "GFRAA", "ANIONGAP" in the last 168 hours.  No results for input(s): "PROT", "ALBUMIN", "AST", "ALT", "ALKPHOS", "BILITOT" in the last 168 hours. Lipids No results for input(s): "CHOL", "TRIG", "HDL", "LABVLDL", "LDLCALC", "CHOLHDL" in the last 168 hours. HematologyNo results for input(s): "WBC", "RBC", "HGB", "HCT", "MCV", "MCH", "MCHC", "RDW", "PLT" in the last 168 hours. Thyroid No results for input(s): "TSH", "FREET4" in the last 168 hours. BNPNo results for input(s): "BNP", "PROBNP" in the last 168 hours.  DDimer No  results for input(s): "DDIMER" in the last 168 hours.   Radiology/Studies:  No results found.   Assessment and Plan:   New onset atrial fibrillation patient with continued complaints of fatigue, dizziness, lightheadedness, feeling rundown.  She has been on Xarelto uninterrupted for minimum of 4 weeks.  She was started on that for CHA2DS2-VASc of at least 64 (age, sex, hypertension) she was not placed on beta-blocker therapy as she is rate controlled and already had in the past labile blood pressures.  She did undergo echocardiogram with LVEF of 60 to 65%, mild ventricular hypertrophy, mildly elevated pulmonary artery systolic pressures, left atrial size is mildly dilated, there is mild mitral regurgitation, mild to moderate tricuspid regurgitation.  We discussed cardioversion at length with the patient and her daughter.  She has a upcoming appointment with her PCP for some abdominal discomfort and her daughter was called by again stated that they would like to undergo the cardioversion procedure with Dr. Rockey Situ and have her blood work done by her PCP. Chronic shortness of breath with previously diagnosed paralyzed diaphragm.  She follows up with pulmonary in the past for this.  Previously she has taken diuretic therapy but it exacerbates the dizziness and she is no longer on diuretics.  She states her shortness of breath is unchanged from previous visits. Atherosclerosis of the coronary arteries with calcification previously seen on CT scan.  Previously she was on lovastatin 20 mg and Zetia 10 mg daily.  Continues to be statin therapy.  We will need to recheck lipid and LFT in 6 weeks. Essential hypertension with occasional orthostatic hypotension noted today blood pressures 120/64 patient complains of dizziness orthostatics were completed.  She was continued on losartan 25 mg daily if systolic blood pressures greater than 150 only.  She was previously on Norvasc 10 mg with her continued orthostasis her  Norvasc was decreased to 5 mg daily. 5.   Hyperlipidemia with LDL of 118 on 05/15/2022 patient is currently not taking herstatin   medication but continues to follow-up with her PCP she will need to restart these for   secondary prevention.   Risk Assessment/Risk Scores:         CHA2DS2-VASc Score = 4   This indicates a 4.8% annual risk of stroke. The patient's score is based upon: CHF History: 0 HTN History: 1 Diabetes History: 0 Stroke History: 0 Vascular Disease History: 0 Age Score: 2 Gender Score: 1    Shared Decision Making/Informed Consent The risks (stroke, cardiac arrhythmias rarely resulting in the need for a temporary or permanent pacemaker, skin irritation or burns and complications associated with conscious sedation including aspiration, arrhythmia, respiratory failure and death), benefits (restoration of normal sinus rhythm) and alternatives of a direct current cardioversion were explained in detail to Ms. Messing and she agrees to proceed.    For questions or updates, please contact [place] Please consult [email or web address] for contact info under     Signed, Daniell Paradise, NP  09/04/2022 7:56 AM

## 2022-09-07 ENCOUNTER — Other Ambulatory Visit: Payer: Self-pay | Admitting: Cardiovascular Disease

## 2022-09-20 ENCOUNTER — Encounter
Admission: RE | Disposition: A | Discharge status: Home / Self Care | Payer: Self-pay | Source: Home / Self Care | Attending: Cardiovascular Disease

## 2022-09-20 ENCOUNTER — Ambulatory Visit: Payer: Medicare Other | Admitting: Anesthesiology

## 2022-09-20 ENCOUNTER — Other Ambulatory Visit: Payer: Self-pay

## 2022-09-20 ENCOUNTER — Encounter: Payer: Self-pay | Admitting: Cardiovascular Disease

## 2022-09-20 ENCOUNTER — Ambulatory Visit
Admission: RE | Admit: 2022-09-20 | Discharge: 2022-09-20 | Disposition: A | Discharge status: Home / Self Care | Payer: Medicare Other | Attending: Cardiovascular Disease | Admitting: Cardiovascular Disease

## 2022-09-20 DIAGNOSIS — Z85118 Personal history of other malignant neoplasm of bronchus and lung: Secondary | ICD-10-CM | POA: Diagnosis not present

## 2022-09-20 DIAGNOSIS — R079 Chest pain, unspecified: Secondary | ICD-10-CM | POA: Insufficient documentation

## 2022-09-20 DIAGNOSIS — I2510 Atherosclerotic heart disease of native coronary artery without angina pectoris: Secondary | ICD-10-CM | POA: Diagnosis not present

## 2022-09-20 DIAGNOSIS — I4891 Unspecified atrial fibrillation: Secondary | ICD-10-CM

## 2022-09-20 DIAGNOSIS — E785 Hyperlipidemia, unspecified: Secondary | ICD-10-CM | POA: Insufficient documentation

## 2022-09-20 DIAGNOSIS — Z923 Personal history of irradiation: Secondary | ICD-10-CM | POA: Insufficient documentation

## 2022-09-20 DIAGNOSIS — I10 Essential (primary) hypertension: Secondary | ICD-10-CM | POA: Insufficient documentation

## 2022-09-20 DIAGNOSIS — Z8249 Family history of ischemic heart disease and other diseases of the circulatory system: Secondary | ICD-10-CM | POA: Diagnosis not present

## 2022-09-20 DIAGNOSIS — I4819 Other persistent atrial fibrillation: Secondary | ICD-10-CM | POA: Diagnosis not present

## 2022-09-20 DIAGNOSIS — G8929 Other chronic pain: Secondary | ICD-10-CM | POA: Diagnosis not present

## 2022-09-20 DIAGNOSIS — R0602 Shortness of breath: Secondary | ICD-10-CM

## 2022-09-20 DIAGNOSIS — M79602 Pain in left arm: Secondary | ICD-10-CM | POA: Insufficient documentation

## 2022-09-20 DIAGNOSIS — Z79899 Other long term (current) drug therapy: Secondary | ICD-10-CM | POA: Diagnosis not present

## 2022-09-20 DIAGNOSIS — I081 Rheumatic disorders of both mitral and tricuspid valves: Secondary | ICD-10-CM | POA: Diagnosis not present

## 2022-09-20 DIAGNOSIS — Z7901 Long term (current) use of anticoagulants: Secondary | ICD-10-CM | POA: Insufficient documentation

## 2022-09-20 HISTORY — PX: CARDIOVERSION: SHX1299

## 2022-09-20 SURGERY — CARDIOVERSION
Anesthesia: General

## 2022-09-20 MED ORDER — AMLODIPINE BESYLATE 5 MG PO TABS
5.0000 mg | ORAL_TABLET | Freq: Once | ORAL | Status: AC
  Administered 2022-09-20: 5 mg via ORAL

## 2022-09-20 MED ORDER — SODIUM CHLORIDE 0.9 % IV SOLN: INTRAVENOUS | Status: DC

## 2022-09-20 MED ORDER — AMLODIPINE BESYLATE 5 MG PO TABS
ORAL_TABLET | ORAL | Status: AC
  Filled 2022-09-20: qty 1

## 2022-09-20 MED ORDER — PROPOFOL 500 MG/50ML IV EMUL
INTRAVENOUS | Status: DC | PRN
  Administered 2022-09-20: 70 ug/kg/min via INTRAVENOUS

## 2022-09-20 MED ORDER — PROPOFOL 10 MG/ML IV BOLUS
INTRAVENOUS | Status: AC
  Filled 2022-09-20: qty 60

## 2022-09-20 NOTE — Progress Notes (Signed)
Patient BP elevated. MD notified. Instructed to give 5 mg amlodipine before discharging home.

## 2022-09-20 NOTE — Interval H&P Note (Signed)
History and Physical Interval Note:  09/20/2022 8:08 AM  Lauren Hammond  has presented today for surgery, with the diagnosis of Cardioversion   AFib.  The various methods of treatment have been discussed with the patient and family. After consideration of risks, benefits and other options for treatment, the patient has consented to  Procedure(s): CARDIOVERSION (N/A) as a surgical intervention.  The patient's history has been reviewed, patient examined, no change in status, stable for surgery.  I have reviewed the patient's chart and labs.  Questions were answered to the patient's satisfaction.     Ida Rogue

## 2022-09-20 NOTE — H&P (Signed)
H&P Addendum, pre-cardioversion ° °Patient was seen and evaluated prior to -cardioversion procedure °Symptoms, prior testing details again confirmed with the patient °Patient examined, no significant change from prior exam °Lab work reviewed in detail personally by myself °Patient understands risk and benefit of the procedure,  °The risks (stroke, cardiac arrhythmias rarely resulting in the need for a temporary or permanent pacemaker, skin irritation or burns and complications associated with conscious sedation including aspiration, arrhythmia, respiratory failure and death), benefits (restoration of normal sinus rhythm) and alternatives of a direct current cardioversion were explained in detail °Patient willing to proceed. ° °Signed, °Tim Vaness Jelinski, MD, Ph.D °[facility]  °

## 2022-09-20 NOTE — Transfer of Care (Signed)
Immediate Anesthesia Transfer of Care Note  Patient: [name]  Procedure(s) Performed: CARDIOVERSION  Patient Location: Short Stay  Anesthesia Type:General  Level of Consciousness: drowsy  Airway & Oxygen Therapy: Patient Spontanous Breathing and Patient connected to nasal cannula oxygen  Post-op Assessment: Report given to RN and Post -op Vital signs reviewed and stable  Post vital signs: Reviewed and stable  Last Vitals:  Vitals Value Taken Time  BP 109/63 09/20/22 0748  Temp    Pulse 69 09/20/22 0750  Resp 13 09/20/22 0750  SpO2 98 % 09/20/22 0750    Last Pain:  Vitals:   09/20/22 0704  TempSrc: Oral  PainSc: 0-No pain         Complications: No notable events documented.

## 2022-09-20 NOTE — CV Procedure (Signed)
Cardioversion procedure note For atrial fibrillation, persistent.  Procedure Details:  Consent: Risks of procedure as well as the alternatives and risks of each were explained to the (patient/caregiver).  Consent for procedure obtained.  Time Out: Verified patient identification, verified procedure, site/side was marked, verified correct patient position, special equipment/implants available, medications/allergies/relevent history reviewed, required imaging and test results available.  Performed  Patient placed on cardiac monitor, pulse oximetry, supplemental oxygen as necessary.   Sedation given: propofol IV, Dr. Bertell Maria Pacer pads placed anterior and posterior chest.   Cardioverted 1 time(s).   Cardioverted at  150 J. Synchronized biphasic Converted to NSR   Evaluation: Findings: Post procedure EKG shows: NSR Complications: None Patient did tolerate procedure well.  Time Spent Directly with the Patient:  52 minutes   Esmond Plants, M.D., Ph.D.

## 2022-09-20 NOTE — Anesthesia Postprocedure Evaluation (Signed)
Anesthesia Post Note  Patient: Lauren Hammond  Procedure(s) Performed: CARDIOVERSION  Patient location during evaluation: Specials Recovery Anesthesia Type: General Level of consciousness: awake and alert Pain management: pain level controlled Vital Signs Assessment: post-procedure vital signs reviewed and stable Respiratory status: spontaneous breathing, nonlabored ventilation, respiratory function stable and patient connected to nasal cannula oxygen Cardiovascular status: blood pressure returned to baseline and stable Postop Assessment: no apparent nausea or vomiting Anesthetic complications: no   No notable events documented.   Last Vitals:  Vitals:   09/20/22 0800 09/20/22 0815  BP: 120/75 (!) 155/84  Pulse: 68 70  Resp: 11 16  Temp:    SpO2: 97% 98%    Last Pain:  Vitals:   09/20/22 0815  TempSrc:   PainSc: 0-No pain                 Arita Miss

## 2022-09-20 NOTE — Anesthesia Preprocedure Evaluation (Addendum)
Anesthesia Evaluation  Patient identified by MRN, date of birth, ID band Patient awake    Reviewed: Allergy & Precautions, NPO status , Patient's Chart, lab work & pertinent test results  History of Anesthesia Complications Negative for: history of anesthetic complications  Airway Mallampati: II  TM Distance: >3 FB Neck ROM: Full    Dental no notable dental hx. (+) Teeth Intact   Pulmonary neg sleep apnea, COPD, Patient abstained from smoking.Not current smoker,    Pulmonary exam normal breath sounds clear to auscultation       Cardiovascular Exercise Tolerance: Poor METShypertension, pulmonary hypertension+ CAD and + DOE  (-) Past MI + dysrhythmias Atrial Fibrillation + Valvular Problems/Murmurs  Rhythm:Irregular Rate:Normal - Systolic murmurs . Left ventricular ejection fraction, by estimation, is 60 to 65%. The  left ventricle has normal function. The left ventricle has no regional  wall motion abnormalities. There is mild asymmetric left ventricular  hypertrophy of the basal-septal segment.  Left ventricular diastolic parameters are indeterminate.  2. Right ventricular systolic function is normal. The right ventricular  size is normal. There is mildly elevated pulmonary artery systolic  pressure. The estimated right ventricular systolic pressure is 16.1 mmHg.  3. Left atrial size was mildly dilated.  4. The mitral valve is normal in structure. Mild mitral valve  regurgitation. No evidence of mitral stenosis.  5. Tricuspid valve regurgitation is mild to moderate.  6. The aortic valve is normal in structure. Aortic valve regurgitation is  not visualized. No aortic stenosis is present.  7. The inferior vena cava is normal in size with greater than 50%  respiratory variability, suggesting right atrial pressure of 3 mmHg.    Neuro/Psych PSYCHIATRIC DISORDERS Depression negative neurological ROS     GI/Hepatic neg GERD   ,(+)     (-) substance abuse  ,   Endo/Other  neg diabetesHypothyroidism   Renal/GU negative Renal ROS     Musculoskeletal   Abdominal   Peds  Hematology   Anesthesia Other Findings Past Medical History: No date: Barrett esophagus No date: Depression No date: Generalized OA No date: HTN (hypertension) No date: Hyperlipemia No date: Hypothyroid No date: Insomnia 11/2015: Lung cancer ([place])     Comment:  treated with radiation 2017: Personal history of radiation therapy     Comment:  lung Ca 1981: Uterine cancer ([place])     Comment:  uterus ca with a hysterectomy  Reproductive/Obstetrics                            Anesthesia Physical Anesthesia Plan  ASA: 3  Anesthesia Plan: General   Post-op Pain Management: Minimal or no pain anticipated   Induction: Intravenous  PONV Risk Score and Plan: 3 and Propofol infusion, TIVA and Ondansetron  Airway Management Planned: Nasal Cannula  Additional Equipment: None  Intra-op Plan:   Post-operative Plan:   Informed Consent: I have reviewed the patients History and Physical, chart, labs and discussed the procedure including the risks, benefits and alternatives for the proposed anesthesia with the patient or authorized representative who has indicated his/her understanding and acceptance.     Dental advisory given  Plan Discussed with: CRNA and Surgeon  Anesthesia Plan Comments: (Discussed risks of anesthesia with patient, including possibility of difficulty with spontaneous ventilation under anesthesia necessitating airway intervention, PONV, and rare risks such as cardiac or respiratory or neurological events, and allergic reactions. Discussed the role of CRNA in patient's perioperative care. Patient understands.)  Anesthesia Quick Evaluation

## 2022-09-21 ENCOUNTER — Encounter: Payer: Self-pay | Admitting: Cardiovascular Disease

## 2022-10-01 NOTE — Progress Notes (Unsigned)
Cardiology Clinic Note   Patient Name: Lauren Hammond Date of Encounter: 10/02/2022  Primary Care Provider:  Tracie Harrier, MD Primary Cardiologist:  Ida Rogue, MD  Patient Profile    88 year old female with history of lung cancer with radiation therapy, hyperlipidemia, chronic chest and left arm pain, essential hypertension, and new onset atrial fibrillation who is being seen today status post direct-current cardioversion for her new onset atrial fibrillation.  Past Medical History    Past Medical History:  Diagnosis Date   Barrett esophagus    Depression    Generalized OA    HTN (hypertension)    Hyperlipemia    Hypothyroid    Insomnia    Lung cancer ([place]) 11/2015   treated with radiation   Personal history of radiation therapy 2017   lung Ca   Uterine cancer ([place]) 1981   uterus ca with a hysterectomy   Past Surgical History:  Procedure Laterality Date   ABDOMINAL HYSTERECTOMY  1984   [place], [place]   APPENDECTOMY     BREAST BIOPSY Right 1980's   neg   CARDIOVERSION N/A 09/20/2022   Procedure: CARDIOVERSION;  Surgeon: Minna Merritts, MD;  Location: [facility] ORS;  Service: Cardiovascular;  Laterality: N/A;   KNEE ARTHROSCOPY Right 2000   [place]   right breast bx      Allergies  Allergies  Allergen Reactions   Atorvastatin Other (See Comments)    Muscle Aches   Bupropion Other (See Comments)    Unspecified   Other Other (See Comments)    Unspecified, passed out Reaction: patient was unable to communicate and family member was unable to tell me any allergies    Percodan [Oxycodone-Aspirin]    Pravastatin Other (See Comments)    Muscle Aches    History of Present Illness    Lauren Hammond is an 88 year old female with a past medical history mentioned above of lung cancer with radiation therapy, hyperlipidemia, chronic chest and left arm pain, hypertension and a recent new onset atrial fibrillation.  She has significant lung  cancer with a history of radiation therapy to the left upper lobe lesion.  04/2018 chest CT of the chest was a bandlike area that was masslike structure in the anterior left upper lobe was identified measuring 2.3 x 1.2 x 1 cm.  The underlying lung lesion was no longer visible.  Pulmonary nodule in the right lower lobe measured 4 mm and was unchanged from previous exam.  There was new subsegmental atelectasis identified within the left lung base.  12/27/2019 CT of the chest showed stable bandlike area of radiation fibrosis to the left upper lobe again no findings suspicious for residual or recurrent tumor.  06/04/2019 showed echocardiogram that revealed LVEF 60-65%, left atrium was mildly dilated, 07/02/2019 she had PFTs which revealed moderate restrictive disease without significant bronchodilator response and positive for hyperinflation.  When she was seen in clinic 07/18/2022 she had complaints of fatigue, shortness of breath, and increasing peripheral edema.  Shortness of breath she stated was unchanged over the last 6 months.  Unfortunately EKG revealed new onset atrial fibrillation and was rate controlled.  Her aspirin therapy was discontinued and she was started on apixaban 5 mg twice daily for CHA2DS2-VASc score of at least 4.  Repeat echocardiogram revealed LVEF of 60 to 65%, no regional wall motion abnormalities, mildly elevated pulmonary artery systolic pressure, left atrial size is mildly dilated, there was mild mitral regurgitation and mild to moderate tricuspid  regurgitation.  She was set up for DCCV with Dr. Rockey Situ on 09/20/2022 at that time she she was cardioverted x1 and 150 J and converted to normal sinus rhythm.  She had tolerated the procedure well without any complications subsequently discharged home.  She returns clinic today accompanied by her daughter.  She stated that 2 days after her cardioversion that she felt wonderful feeling as though that she could run a marathon.  On the third day she  has fatigue, stiffness, generalized weakness.  She stated that she was told that her cardioversion was successful and she only had to be shocked 1 time.  She believes today that she is back in atrial fibrillation.  She denies chest pain, dizziness, lightheadedness, or near syncope.  Denies any hospitalizations or emergency department visits as well. Home Medications    Current Outpatient Medications  Medication Sig Dispense Refill   amLODipine (NORVASC) 5 MG tablet Take 1 tablet (5 mg total) by mouth daily. 90 tablet 3   Calcium Carbonate-Vitamin D 500-125 MG-UNIT TABS Take by mouth.     gabapentin (NEURONTIN) 100 MG capsule Take 1 capsule by mouth as needed.     HYDROcodone-acetaminophen (NORCO/VICODIN) 5-325 MG tablet Take 1 tablet by mouth daily as needed for moderate pain.      levothyroxine (SYNTHROID) 100 MCG tablet Take 100 mcg by mouth daily.     losartan (COZAAR) 25 MG tablet Take 1 tablet (25 mg total) by mouth at bedtime. (Patient taking differently: Take 25 mg by mouth at bedtime. Only takes if blood pressure is up.) 90 tablet 3   Multiple Vitamin (MULTI-VITAMINS) TABS Take 1 tablet by mouth daily.     omeprazole (PRILOSEC) 20 MG capsule Take 40 mg by mouth daily as needed.      rivaroxaban (XARELTO) 20 MG TABS tablet Take 1 tablet (20 mg total) by mouth daily with supper. 30 tablet 5   sertraline (ZOLOFT) 100 MG tablet Take 100 mg by mouth daily.     temazepam (RESTORIL) 15 MG capsule Take 15 mg by mouth at bedtime as needed for sleep.      tetrahydrozoline 0.05 % ophthalmic solution Place 1 drop into both eyes.     VITAMIN D PO Take 1 tablet by mouth daily.     No current facility-administered medications for this visit.     Family History    Family History  Problem Relation Age of Onset   Hypertension Other    Breast cancer Maternal Aunt    Breast cancer Sister 6   She indicated that the status of her sister is unknown. She indicated that the status of her maternal aunt  is unknown. She indicated that the status of her other is unknown.  Social History    Social History   Socioeconomic History   Marital status: Widowed    Spouse name: Not on file   Number of children: Not on file   Years of education: Not on file   Highest education level: Not on file  Occupational History   Not on file  Tobacco Use   Smoking status: Never   Smokeless tobacco: Never  Vaping Use   Vaping Use: Never used  Substance and Sexual Activity   Alcohol use: No    Alcohol/week: 0.0 standard drinks of alcohol   Drug use: No   Sexual activity: Not on file  Other Topics Concern   Not on file  Social History Narrative   Lives alone.    Social Determinants  of Health   Financial Resource Strain: Not on file  Food Insecurity: Not on file  Transportation Needs: Not on file  Physical Activity: Not on file  Stress: Not on file  Social Connections: Not on file  Intimate Partner Violence: Not on file     Review of Systems    General:  No chills, fever, night sweats or weight changes.  Endorses extreme fatigue Cardiovascular:  No chest pain, endorses dyspnea on exertion, edema, orthopnea, palpitations, paroxysmal nocturnal dyspnea. Dermatological: No rash, lesions/masses Respiratory: No cough, endorses exertional dyspnea Urologic: No hematuria, dysuria Abdominal:   No nausea, vomiting, diarrhea, bright red blood per rectum, melena, or hematemesis Skull skeletal: Endorses stiffness to the bilateral lower extremities Neurologic:  No visual changes, wkns, changes in mental status.  Endorses generalized weakness All other systems reviewed and are otherwise negative except as noted above.   Physical Exam    VS:  BP 120/80 (BP Location: Left Arm, Patient Position: Sitting, Cuff Size: Normal)   Pulse 73   Ht 5\' 6"  (1.676 m)   Wt 139 lb 6 oz (63.2 kg)   SpO2 94%   BMI 22.50 kg/m  , BMI Body mass index is 22.5 kg/m.     GEN: Well nourished, well developed, in no acute  distress. HEENT: normal. Neck: Supple, no JVD, carotid bruits, or masses. Cardiac: Irregularly irregular, no murmurs, rubs, or gallops. No clubbing, cyanosis, edema.  Radials/DP/PT 2+ and equal bilaterally.  Respiratory:  Respirations regular and unlabored, clear to auscultation bilaterally. GI: Soft, nontender, nondistended, BS + x 4. MS: no deformity or atrophy. Skin: warm and dry, no rash. Neuro:  Strength and sensation are intact. Psych: Normal affect.  Accessory Clinical Findings    ECG personally reviewed by me today-rate controlled atrial fibrillation with a left axis deviation- No acute changes  Lab Results  Component Value Date   WBC 5.4 04/18/2016   HGB 14.8 04/18/2016   HCT 44.3 04/18/2016   MCV 86.0 04/18/2016   PLT 163 04/18/2016   Lab Results  Component Value Date   CREATININE 1.00 09/30/2020   BUN 16 04/18/2016   NA 138 04/18/2016   K 4.0 04/18/2016   CL 104 04/18/2016   CO2 26 04/18/2016   Lab Results  Component Value Date   ALT 13 (L) 12/01/2015   AST 19 12/01/2015   ALKPHOS 52 12/01/2015   BILITOT 0.5 12/01/2015   No results found for: "CHOL", "HDL", "LDLCALC", "LDLDIRECT", "TRIG", "CHOLHDL"  No results found for: "HGBA1C"  Assessment & Plan   1.  Persistent atrial fibrillation status post DCCV.  She continues to have complaints of fatigue and generalized weakness as well as stiffness of bilateral lower extremities.  Not quite as a dizzy or lightheaded as prior to her cardioversion but continued with the same symptoms.  She states that 2 days after cardioversion she was doing well and on the third day her symptoms restarted again.  She is concerned today about what she did to go back into atrial fibrillation and has been explained that it is nothing she did or did not do.  She continues to be rate controlled by EKG unfortunately with her continued complaints of weakness and extreme fatigue she was not started on beta-blocker therapy.  For CHA2DS2-VASc  score of at least 4 she has been continued on Xarelto 20 mg daily which she requests refill sent to her mail order pharmacy today.  With her continued symptoms and failed cardioversion we discussed discussed repeating  the cardioversion at this time we also discussed a referral to A-fib clinic or a visit to see one of the EP providers.  She prefers to stay in [place] and would like to have a follow-up with one of the EP providers.  2.  She has chronic shortness of breath was previously diagnosed as paralyzed diaphragm.  She has followed up with pulmonary in the past.  Previously she was taken diuretic therapy but this exacerbated her dizziness so she is no longer taking diuretics.  She does state that her shortness of breath is unchanged from prior visits.  3.  Essential hypertension with blood pressure 120/80.  She is continued on losartan 25 mg daily when her systolic blood pressures are greater than 150.  As well as she does take amlodipine 5 mg daily.  She requires no refills on either of these medications today.  4.  Hyperlipidemia with LDL of 118 on 05/15/2022.  Patient previously been on lovastatin and Zetia.  This continues to be followed by her PCP.  Will likely need to return to taking statin therapy for coronary artery calcification previously seen on CT scan.  5.  Disposition patient return to clinic to see MD/APP in 3 months or sooner if needed.  She has been scheduled for an appointment with the EP to discuss her options for atrial fibrillation in the interim.  She and her daughter have both been advised that they have any questions or need anything to notify us prior to her return appointment.  Jia Mohamed, NP 10/02/2022, 12:13 PM

## 2022-10-02 ENCOUNTER — Encounter: Payer: Self-pay | Admitting: Cardiology

## 2022-10-02 ENCOUNTER — Other Ambulatory Visit: Payer: Self-pay

## 2022-10-02 ENCOUNTER — Telehealth: Payer: Self-pay

## 2022-10-02 ENCOUNTER — Ambulatory Visit: Payer: Medicare Other | Attending: Cardiology | Admitting: Cardiology

## 2022-10-02 VITALS — BP 120/80 | HR 73 | Ht 66.0 in | Wt 139.4 lb

## 2022-10-02 DIAGNOSIS — I4819 Other persistent atrial fibrillation: Secondary | ICD-10-CM | POA: Diagnosis not present

## 2022-10-02 DIAGNOSIS — E782 Mixed hyperlipidemia: Secondary | ICD-10-CM

## 2022-10-02 DIAGNOSIS — I10 Essential (primary) hypertension: Secondary | ICD-10-CM | POA: Diagnosis not present

## 2022-10-02 DIAGNOSIS — I2510 Atherosclerotic heart disease of native coronary artery without angina pectoris: Secondary | ICD-10-CM

## 2022-10-02 DIAGNOSIS — I4891 Unspecified atrial fibrillation: Secondary | ICD-10-CM

## 2022-10-02 DIAGNOSIS — R0602 Shortness of breath: Secondary | ICD-10-CM

## 2022-10-02 DIAGNOSIS — I2584 Coronary atherosclerosis due to calcified coronary lesion: Secondary | ICD-10-CM

## 2022-10-02 MED ORDER — RIVAROXABAN 15 MG PO TABS: 15.0000 mg | ORAL_TABLET | Freq: Every day | ORAL | 1 refills | Status: DC

## 2022-10-02 NOTE — Telephone Encounter (Signed)
Prescription refill request for Xarelto received.  Indication:afib Last office visit:11/23 Weight:63.2 kg Age:88 Scr:1.1 CrCl:35.27  ml/min  Under review for dose reduction

## 2022-10-02 NOTE — Telephone Encounter (Signed)
Yes please reduce to 15mg  once daily with dinner

## 2022-10-02 NOTE — Patient Instructions (Signed)
Medication Instructions:  - Your physician recommends that you continue on your current medications as directed. Please refer to the Current Medication list given to you today.  *If you need a refill on your cardiac medications before your next appointment, please call your pharmacy*   Lab Work: - none ordered  If you have labs (blood work) drawn today and your tests are completely normal, you will receive your results only by: [place] (if you have MyChart) OR A paper copy in the mail If you have any lab test that is abnormal or we need to change your treatment, we will call you to review the results.   Testing/Procedures:  - You have been referred to : Dr. Lars Mage (Electrophysiology) for further recommendations regarding your atrial fibrillation    Follow-Up: At [facility], you and your health needs are our priority.  As part of our continuing mission to provide you with exceptional heart care, we have created designated Provider Care Teams.  These Care Teams include your primary Cardiologist (physician) and Advanced Practice Providers (APPs -  Physician Assistants and Nurse Practitioners) who all work together to provide you with the care you need, when you need it.  We recommend signing up for the patient portal called "MyChart".  Sign up information is provided on this After Visit Summary.  MyChart is used to connect with patients for Virtual Visits (Telemedicine).  Patients are able to view lab/test results, encounter notes, upcoming appointments, etc.  Non-urgent messages can be sent to your provider as well.   To learn more about what you can do with MyChart, go to [email or web address].    Your next appointment:   3 month(s)  The format for your next appointment:   In Person  Provider:   You may see Ida Rogue, MD or one of the following Advanced Practice Providers on your designated Care Team:    Gerrie Nordmann, NP    Other  Instructions N/a  Important Information About Sugar

## 2022-10-30 NOTE — Progress Notes (Unsigned)
Electrophysiology Office Note:    Date:  10/31/2022   ID:  Lauren Hammond, DOB 1932-06-11, MRN [id number]  PCP:  Tracie Harrier, MD  [facility] Cardiologist:  Ida Rogue, MD  [facility] Electrophysiologist:  Vickie Epley, MD   Referring MD: Gerrie Nordmann, NP   Chief Complaint: AF  History of Present Illness:    Lauren Hammond is a 88 y.o. female who presents for an evaluation of AF at the request of Gerrie Nordmann. Their medical history includes lung CA s/p XRT, HLD, chronic chest/arm pain, HTN and new onset AF. She last saw Sherri 10/02/2022. Her AF was diagnosed after presenting with dyspnea in August 2023. She takes eliquis for stroke ppx. She had a cardioversion in October and felt great afterwards. Unfortunately, she went back into AF after a few days with return of symptoms.   She is with her daughter today in clinic.  She feels tired, short of breath and dizzy when in atrial fibrillation.  She felt much better after cardioversion for several days.  She takes her Xarelto without any bleeding issues.     Past Medical History:  Diagnosis Date   Barrett esophagus    Depression    Generalized OA    HTN (hypertension)    Hyperlipemia    Hypothyroid    Insomnia    Lung cancer ([place]) 11/2015   treated with radiation   Personal history of radiation therapy 2017   lung Ca   Uterine cancer ([place]) 1981   uterus ca with a hysterectomy    Past Surgical History:  Procedure Laterality Date   ABDOMINAL HYSTERECTOMY  1984   [place], [place]   APPENDECTOMY     BREAST BIOPSY Right 1980's   neg   CARDIOVERSION N/A 09/20/2022   Procedure: CARDIOVERSION;  Surgeon: Minna Merritts, MD;  Location: [facility] ORS;  Service: Cardiovascular;  Laterality: N/A;   KNEE ARTHROSCOPY Right 2000   [place]   right breast bx      Current Medications: Current Meds  Medication Sig   amLODipine (NORVASC) 5 MG tablet Take 1 tablet (5 mg total) by mouth daily.   Calcium  Carbonate-Vitamin D 500-125 MG-UNIT TABS Take by mouth.   gabapentin (NEURONTIN) 100 MG capsule Take 1 capsule by mouth as needed.   HYDROcodone-acetaminophen (NORCO/VICODIN) 5-325 MG tablet Take 1 tablet by mouth daily as needed for moderate pain.    levothyroxine (SYNTHROID) 100 MCG tablet Take 100 mcg by mouth daily.   losartan (COZAAR) 25 MG tablet Take 1 tablet (25 mg total) by mouth at bedtime. (Patient taking differently: Take 25 mg by mouth at bedtime. Only takes if blood pressure is up.)   Multiple Vitamin (MULTI-VITAMINS) TABS Take 1 tablet by mouth daily.   omeprazole (PRILOSEC) 20 MG capsule Take 40 mg by mouth daily as needed.    rivaroxaban (XARELTO) 15 MG TABS tablet Take 1 tablet (15 mg total) by mouth daily with supper.   sertraline (ZOLOFT) 100 MG tablet Take 100 mg by mouth daily.   temazepam (RESTORIL) 15 MG capsule Take 15 mg by mouth at bedtime as needed for sleep.    tetrahydrozoline 0.05 % ophthalmic solution Place 1 drop into both eyes.   VITAMIN D PO Take 1 tablet by mouth daily.     Allergies:   Atorvastatin, Bupropion, Other, Percodan [oxycodone-aspirin], and Pravastatin   Social History   Socioeconomic History   Marital status: Widowed    Spouse name: Not on file  Number of children: Not on file   Years of education: Not on file   Highest education level: Not on file  Occupational History   Not on file  Tobacco Use   Smoking status: Never   Smokeless tobacco: Never  Vaping Use   Vaping Use: Never used  Substance and Sexual Activity   Alcohol use: No    Alcohol/week: 0.0 standard drinks of alcohol   Drug use: No   Sexual activity: Not on file  Other Topics Concern   Not on file  Social History Narrative   Lives alone.    Social Determinants of Health   Financial Resource Strain: Not on file  Food Insecurity: Not on file  Transportation Needs: Not on file  Physical Activity: Not on file  Stress: Not on file  Social Connections: Not on file      Family History: The patient's family history includes Breast cancer in her maternal aunt; Breast cancer (age of onset: 46) in her sister; Hypertension in an other family member.  ROS:   Please see the history of present illness.    All other systems reviewed and are negative.  EKGs/Labs/Other Studies Reviewed:    The following studies were reviewed today:  10/02/2022 ECG shows AF with V rate 73 bpm. QTc 442ms.  09/30/2020 Creatinine 1  08/16/2022 Echo EF 60 RV normal     Recent Labs: No results found for requested labs within last 365 days.  Recent Lipid Panel No results found for: "CHOL", "TRIG", "HDL", "CHOLHDL", "VLDL", "LDLCALC", "LDLDIRECT"  Physical Exam:    VS:  BP 128/66   Pulse 99   Ht 5\' 6"  (1.676 m)   Wt 141 lb (64 kg)   SpO2 96%   BMI 22.76 kg/m     Wt Readings from Last 3 Encounters:  10/31/22 141 lb (64 kg)  10/02/22 139 lb 6 oz (63.2 kg)  09/20/22 140 lb (63.5 kg)     GEN:  Well nourished, well developed in no acute distress.  Elderly HEENT: Normal NECK: No JVD; No carotid bruits LYMPHATICS: No lymphadenopathy CARDIAC: Irregularly irregular, no murmurs, rubs, gallops RESPIRATORY:  Clear to auscultation without rales, wheezing or rhonchi  ABDOMEN: Soft, non-tender, non-distended MUSCULOSKELETAL:  No edema; No deformity  SKIN: Warm and dry NEUROLOGIC:  Alert and oriented x 3 PSYCHIATRIC:  Normal affect       ASSESSMENT:    1. Persistent atrial fibrillation ([place])   2. Essential hypertension    PLAN:    In order of problems listed above:  #Persistent AF Symptomatic.  With history of lung CA, amiodarone is not the best option. I have recommended dofetilide. I have discussed the dofetilide loading process in detail including the risks and she wishes to proceed.  We will get this scheduled for her.  #Hypertension At goal today.  Recommend checking blood pressures 1-2 times per week at home and recording the values.  Recommend  bringing these recordings to the primary care physician.      Medication Adjustments/Labs and Tests Ordered: Current medicines are reviewed at length with the patient today.  Concerns regarding medicines are outlined above.  No orders of the defined types were placed in this encounter.  No orders of the defined types were placed in this encounter.    Signed, Hilton Cork. Quentin Ore, MD, [facility], [facility] 10/31/2022 10:02 AM    Electrophysiology [facility]

## 2022-10-31 ENCOUNTER — Ambulatory Visit: Payer: Medicare Other | Attending: Cardiology | Admitting: Cardiology

## 2022-10-31 ENCOUNTER — Encounter: Payer: Self-pay | Admitting: Cardiology

## 2022-10-31 VITALS — BP 128/66 | HR 99 | Ht 66.0 in | Wt 141.0 lb

## 2022-10-31 DIAGNOSIS — I10 Essential (primary) hypertension: Secondary | ICD-10-CM | POA: Diagnosis not present

## 2022-10-31 DIAGNOSIS — I4819 Other persistent atrial fibrillation: Secondary | ICD-10-CM

## 2022-10-31 NOTE — Patient Instructions (Signed)
Medication Instructions:  Your physician recommends that you continue on your current medications as directed. Please refer to the Current Medication list given to you today.  *If you need a refill on your cardiac medications before your next appointment, please call your pharmacy*   Lab Work: None ordered   Testing/Procedures: None ordered   Follow-Up: At [facility], you and your health needs are our priority.  As part of our continuing mission to provide you with exceptional heart care, we have created designated Provider Care Teams.  These Care Teams include your primary Cardiologist (physician) and Advanced Practice Providers (APPs -  Physician Assistants and Nurse Practitioners) who all work together to provide you with the care you need, when you need it.  Your next appointment:   To be   determined  The format for your next appointment:   In Person  Provider:   Lars Mage, MD    Thank you for choosing [facility]!!   [phone]  Other Instructions  Dofetilide (Tikosyn) Admission  Prior to day of admission: Check with drug insurance company for cost of drug to ensure affordability --- Dofetilide 500 mcg twice a day.  GoodRx is an option if insurance copay is unaffordable.  A pharmacist will review all your medications for potential interactions with Tikosyn. If any medication changes are needed prior to admission we will be in touch with you.  If any new medications are started AFTER your admission date is set with [address] [phone]). Please notify our office immediately so your medication list can be updated and reviewed by our pharmacist again.  No Benadryl is allowed 3 days prior to admission.  Please ensure no missed doses of your anticoagulation (blood thinner) for 3 weeks prior to admission. If a dose is missed please notify our office immediately.  Tikosyn initiation requires a 3 night/4 day hospital stay with constant telemetry  monitoring. You will have an EKG after each dose of Tikosyn as well as daily lab draws. On day of admission: Afib Clinic office visit on the morning of admission is needed for preliminary labs/ekg.  You may bring personal belongings/clothing with you to the hospital. Please leave your suitcase in the car until you arrive in admissions.  Time of admission is dependent on bed availability in the hospital. In some instances, you will be sent home until bed is available. Rarely admission can be delayed to the following day if hospital census prevents available beds.  If the drug does not convert you to normal rhythm a cardioversion after the 4th dose of Tikosyn.  Questions please call our office at [place]

## 2022-11-01 ENCOUNTER — Telehealth: Payer: Self-pay | Admitting: Pharmacist

## 2022-11-01 ENCOUNTER — Telehealth: Payer: Self-pay | Admitting: Cardiology

## 2022-11-01 NOTE — Telephone Encounter (Signed)
Medication list reviewed in anticipation of upcoming Tikosyn initiation. Patient is taking sertraline which is QTc prolonging however is less so than other SSRIs, ok to continue with close EKG monitoring.  Patient is anticoagulated on Xarelto 15mg  daily on the appropriate dose (CrCl 64mL/min). Please ensure that patient has not missed any anticoagulation doses in the 3 weeks prior to Tikosyn initiation.   Patient will need to be counseled to avoid use of Benadryl while on Tikosyn and in the 2-3 days prior to Tikosyn initiation.

## 2022-11-01 NOTE — Telephone Encounter (Signed)
Daughter would like to get schedule for the patient's procedure.

## 2022-11-01 NOTE — Telephone Encounter (Signed)
Left detailed message informing dtr that I forwarded my chart message and this call to the AFib clinic to arrange Tikosyn loading.

## 2022-11-30 ENCOUNTER — Encounter (HOSPITAL_COMMUNITY): Payer: Self-pay

## 2022-12-04 ENCOUNTER — Other Ambulatory Visit: Payer: Self-pay

## 2022-12-04 ENCOUNTER — Other Ambulatory Visit (HOSPITAL_COMMUNITY): Payer: Self-pay

## 2022-12-04 ENCOUNTER — Encounter (HOSPITAL_COMMUNITY): Payer: Self-pay | Admitting: Cardiology

## 2022-12-04 ENCOUNTER — Inpatient Hospital Stay (HOSPITAL_COMMUNITY)
Admission: RE | Admit: 2022-12-04 | Discharge: 2022-12-07 | DRG: 310 | Disposition: A | Discharge status: Home / Self Care | Payer: Medicare Other | Source: Ambulatory Visit | Attending: Cardiology | Admitting: Cardiology

## 2022-12-04 ENCOUNTER — Ambulatory Visit (HOSPITAL_COMMUNITY)
Admission: RE | Admit: 2022-12-04 | Discharge: 2022-12-04 | Disposition: A | Discharge status: Home / Self Care | Payer: Medicare Other | Source: Ambulatory Visit | Attending: Nurse Practitioner | Admitting: Nurse Practitioner

## 2022-12-04 VITALS — BP 122/64 | HR 74 | Ht 66.0 in | Wt 143.2 lb

## 2022-12-04 DIAGNOSIS — I4819 Other persistent atrial fibrillation: Secondary | ICD-10-CM

## 2022-12-04 DIAGNOSIS — I4891 Unspecified atrial fibrillation: Principal | ICD-10-CM | POA: Diagnosis present

## 2022-12-04 DIAGNOSIS — M159 Polyosteoarthritis, unspecified: Secondary | ICD-10-CM | POA: Diagnosis present

## 2022-12-04 DIAGNOSIS — Z85118 Personal history of other malignant neoplasm of bronchus and lung: Secondary | ICD-10-CM

## 2022-12-04 DIAGNOSIS — I2510 Atherosclerotic heart disease of native coronary artery without angina pectoris: Secondary | ICD-10-CM | POA: Diagnosis not present

## 2022-12-04 DIAGNOSIS — F32A Depression, unspecified: Secondary | ICD-10-CM | POA: Diagnosis present

## 2022-12-04 DIAGNOSIS — I10 Essential (primary) hypertension: Secondary | ICD-10-CM | POA: Diagnosis present

## 2022-12-04 DIAGNOSIS — G4700 Insomnia, unspecified: Secondary | ICD-10-CM | POA: Diagnosis present

## 2022-12-04 DIAGNOSIS — E039 Hypothyroidism, unspecified: Secondary | ICD-10-CM | POA: Diagnosis present

## 2022-12-04 DIAGNOSIS — Z8249 Family history of ischemic heart disease and other diseases of the circulatory system: Secondary | ICD-10-CM | POA: Diagnosis not present

## 2022-12-04 DIAGNOSIS — D6869 Other thrombophilia: Secondary | ICD-10-CM

## 2022-12-04 DIAGNOSIS — Z66 Do not resuscitate: Secondary | ICD-10-CM | POA: Diagnosis not present

## 2022-12-04 DIAGNOSIS — Z8542 Personal history of malignant neoplasm of other parts of uterus: Secondary | ICD-10-CM

## 2022-12-04 DIAGNOSIS — Z79899 Other long term (current) drug therapy: Secondary | ICD-10-CM

## 2022-12-04 DIAGNOSIS — Z79890 Hormone replacement therapy (postmenopausal): Secondary | ICD-10-CM | POA: Diagnosis not present

## 2022-12-04 DIAGNOSIS — Z7901 Long term (current) use of anticoagulants: Secondary | ICD-10-CM

## 2022-12-04 DIAGNOSIS — Z885 Allergy status to narcotic agent status: Secondary | ICD-10-CM

## 2022-12-04 DIAGNOSIS — E785 Hyperlipidemia, unspecified: Secondary | ICD-10-CM | POA: Diagnosis present

## 2022-12-04 DIAGNOSIS — Z888 Allergy status to other drugs, medicaments and biological substances status: Secondary | ICD-10-CM | POA: Diagnosis not present

## 2022-12-04 DIAGNOSIS — Z90710 Acquired absence of both cervix and uterus: Secondary | ICD-10-CM | POA: Diagnosis not present

## 2022-12-04 DIAGNOSIS — I4892 Unspecified atrial flutter: Secondary | ICD-10-CM | POA: Diagnosis not present

## 2022-12-04 DIAGNOSIS — Z923 Personal history of irradiation: Secondary | ICD-10-CM

## 2022-12-04 LAB — BASIC METABOLIC PANEL
Anion gap: 8 (ref 5–15)
Anion gap: 9 (ref 5–15)
BUN: 28 mg/dL — ABNORMAL HIGH (ref 8–23)
BUN: 29 mg/dL — ABNORMAL HIGH (ref 8–23)
CO2: 27 mmol/L (ref 22–32)
CO2: 25 mmol/L (ref 22–32)
Calcium: 9.6 mg/dL (ref 8.9–10.3)
Calcium: 9 mg/dL (ref 8.9–10.3)
Chloride: 102 mmol/L (ref 98–111)
Chloride: 102 mmol/L (ref 98–111)
Creatinine, Ser: 0.97 mg/dL (ref 0.44–1.00)
Creatinine, Ser: 1.25 mg/dL — ABNORMAL HIGH (ref 0.44–1.00)
GFR, Estimated: 56 mL/min — ABNORMAL LOW (ref >60)
GFR, Estimated: 41 mL/min — ABNORMAL LOW (ref >60)
Glucose, Bld: 116 mg/dL — ABNORMAL HIGH (ref 70–99)
Glucose, Bld: 107 mg/dL — ABNORMAL HIGH (ref 70–99)
Potassium: 3.6 mmol/L (ref 3.5–5.1)
Potassium: 4.3 mmol/L (ref 3.5–5.1)
Sodium: 137 mmol/L (ref 135–145)
Sodium: 136 mmol/L (ref 135–145)

## 2022-12-04 LAB — MAGNESIUM: Magnesium: 2.3 mg/dL (ref 1.7–2.4)

## 2022-12-04 MED ORDER — LEVOTHYROXINE SODIUM 100 MCG PO TABS
100.0000 ug | ORAL_TABLET | Freq: Every day | ORAL | Status: DC
  Administered 2022-12-05 – 2022-12-07 (×3): 100 ug via ORAL
  Filled 2022-12-04 (×3): qty 1

## 2022-12-04 MED ORDER — SODIUM CHLORIDE 0.9% FLUSH: 3.0000 mL | INTRAVENOUS | Status: DC | PRN

## 2022-12-04 MED ORDER — TEMAZEPAM 7.5 MG PO CAPS: 15.0000 mg | ORAL_CAPSULE | Freq: Every evening | ORAL | Status: DC | PRN

## 2022-12-04 MED ORDER — RIVAROXABAN 15 MG PO TABS: 15.0000 mg | ORAL_TABLET | Freq: Every morning | ORAL | Status: DC

## 2022-12-04 MED ORDER — AMLODIPINE BESYLATE 5 MG PO TABS
5.0000 mg | ORAL_TABLET | Freq: Every day | ORAL | Status: DC
  Administered 2022-12-05 – 2022-12-07 (×2): 5 mg via ORAL
  Filled 2022-12-04 (×4): qty 1

## 2022-12-04 MED ORDER — LOSARTAN POTASSIUM 25 MG PO TABS: 25.0000 mg | ORAL_TABLET | Freq: Every day | ORAL | Status: DC | PRN

## 2022-12-04 MED ORDER — SODIUM CHLORIDE 0.9 % IV SOLN: 250.0000 mL | INTRAVENOUS | Status: DC | PRN

## 2022-12-04 MED ORDER — LOSARTAN POTASSIUM 25 MG PO TABS: ORAL_TABLET | ORAL | Status: AC

## 2022-12-04 MED ORDER — SERTRALINE HCL 100 MG PO TABS
100.0000 mg | ORAL_TABLET | Freq: Every day | ORAL | Status: DC
  Administered 2022-12-05 – 2022-12-07 (×3): 100 mg via ORAL
  Filled 2022-12-04 (×3): qty 1

## 2022-12-04 MED ORDER — POTASSIUM CHLORIDE CRYS ER 20 MEQ PO TBCR
60.0000 meq | EXTENDED_RELEASE_TABLET | Freq: Once | ORAL | Status: AC
  Administered 2022-12-04: 60 meq via ORAL
  Filled 2022-12-04: qty 3

## 2022-12-04 MED ORDER — DOFETILIDE 125 MCG PO CAPS
125.0000 ug | ORAL_CAPSULE | Freq: Two times a day (BID) | ORAL | Status: DC
  Administered 2022-12-04 – 2022-12-07 (×6): 125 ug via ORAL
  Filled 2022-12-04 (×7): qty 1

## 2022-12-04 MED ORDER — SODIUM CHLORIDE 0.9% FLUSH
3.0000 mL | Freq: Two times a day (BID) | INTRAVENOUS | Status: DC
  Administered 2022-12-04 – 2022-12-07 (×5): 3 mL via INTRAVENOUS

## 2022-12-04 MED ORDER — HYDROCODONE-ACETAMINOPHEN 5-325 MG PO TABS: 1.0000 | ORAL_TABLET | Freq: Every day | ORAL | Status: DC | PRN

## 2022-12-04 MED ORDER — RIVAROXABAN 15 MG PO TABS
15.0000 mg | ORAL_TABLET | Freq: Every day | ORAL | Status: DC
  Administered 2022-12-05 – 2022-12-07 (×3): 15 mg via ORAL
  Filled 2022-12-04 (×3): qty 1

## 2022-12-04 NOTE — TOC Benefit Eligibility Note (Signed)
Patient Teacher, English as a foreign language completed.    The patient is currently admitted and upon discharge could be taking dofetlide (Tikosyn) 125 mcg.  The current 30 day co-pay is $41.36.   The patient is insured through [place], [place] Patient Advocate Specialist [place] Patient Advocate Team Direct Number: [phone]  Fax: [phone]

## 2022-12-04 NOTE — Progress Notes (Addendum)
Pharmacy: Dofetilide (Tikosyn) - Initial Consult Assessment and Electrolyte Replacement  Pharmacy consulted to assist in monitoring and replacing electrolytes in this 89 y.o. female admitted on 12/04/2022 undergoing dofetilide initiation.   Assessment:  Patient Exclusion Criteria: If any screening criteria checked as "Yes", then  patient  should NOT receive dofetilide until criteria item is corrected.  If "Yes" please indicate correction plan.  YES  NO Patient  Exclusion Criteria Correction Plan   []   [x]   Baseline QTc interval is greater than or equal to 440 msec. IF above YES box checked dofetilide contraindicated unless patient has ICD; then may proceed if QTc 500-550 msec or with known ventricular conduction abnormalities may proceed with QTc 550-600 msec. QTc =  439    []   [x]   Patient is known or suspected to have a digoxin level greater than 2 ng/ml: No results found for: "DIGOXIN"     []   [x]   Creatinine clearance less than 20 ml/min (calculated using Cockcroft-Gault, actual body weight and serum creatinine): Estimated Creatinine Clearance: 37.5 mL/min (by C-G formula based on SCr of 0.97 mg/dL).     []   [x]  Patient has received drugs known to prolong the QT intervals within the last 48 hours (phenothiazines, tricyclics or tetracyclic antidepressants, erythromycin, H-1 antihistamines, cisapride, fluoroquinolones, azithromycin, ondansetron).   Updated information on QT prolonging agents is available to be searched on the following database:QT prolonging agents  Sertraline is conditional risk. No changes recommended   []   [x]   Patient received a dose of hydrochlorothiazide (Oretic) alone or in any combination including triamterene (Dyazide, Maxzide) in the last 48 hours.    []   [x]  Patient received a medication known to increase dofetilide plasma concentrations prior to initial dofetilide dose:  Trimethoprim (Primsol, Proloprim) in the last 36 hours Verapamil (Calan,  Verelan) in the last 36 hours or a sustained release dose in the last 72 hours Megestrol (Megace) in the last 5 days  Cimetidine (Tagamet) in the last 6 hours Ketoconazole (Nizoral) in the last 24 hours Itraconazole (Sporanox) in the last 48 hours  Prochlorperazine (Compazine) in the last 36 hours     []   [x]   Patient is known to have a history of torsades de pointes; congenital or acquired long QT syndromes.    []   [x]   Patient has received a Class 1 antiarrhythmic with less than 2 half-lives since last dose. (Disopyramide, Quinidine, Procainamide, Lidocaine, Mexiletine, Flecainide, Propafenone)    []   [x]   Patient has received amiodarone therapy in the past 3 months or amiodarone level is greater than 0.3 ng/ml.    Labs:    Component Value Date/Time   K 3.6 12/04/2022 1050   MG 2.3 12/04/2022 1050     Plan: Select One Calculated CrCl  Dose q12h  []  > 60 ml/min 500 mcg  []  40-60 ml/min 250 mcg  [x]  20-40 ml/min 125 mcg   [x]   Physician selected initial dose within range recommended for patients level of renal function - will monitor for response. -With borderline renal function (CrCl ~ 40), plans are to continue 171mcg bid  []   Physician selected initial dose outside of range recommended for patients level of renal function - will discuss if the dose should be altered at this time.   Patient has been appropriately anticoagulated with Xarelto.  Potassium: K 3.5-3.7:  Hold Tikosyn initiation and give KCl 60 mEq po x1 and repeat BMET 2hr after dose - repeat appropriate dose if K < 4  Magnesium: Mg >2: Appropriate to initiate Tikosyn, no replacement needed     Thank you for allowing pharmacy to participate in this patient's care   Hildred Laser, PharmD Clinical Pharmacist **Pharmacist phone directory can now be found on [email or web address] ([email or web address]).  Listed under [place].  Addendum: K now 4.3. Okay to initiate Tikosyn.  Sherlon Handing, PharmD, BCPS Please see amion for  complete clinical pharmacist phone list 12/04/2022 6:17 PM

## 2022-12-04 NOTE — Progress Notes (Signed)
Mobility Specialist - Progress Note   12/04/22 1502  Mobility  Activity Ambulated independently in hallway  Level of [place] wheel walker  Distance Ambulated (ft) 180 ft  Activity Response Tolerated well  Mobility Referral Yes  $Mobility charge 1 Mobility   Pt received in hallway and agreeable to mobility. No complaints throughout. Pt returned to room with all needs met.   Franki Monte  Mobility Specialist Please contact via Solicitor or Rehab office at [phone]

## 2022-12-04 NOTE — Progress Notes (Addendum)
Primary Care Physician: Lauren Harrier, MD Referring Physician: Dr. Lajuana Hammond is a 89 y.o. female with a h/o new onset afib in the fall of 2023. She  had a cardioversion and saw improvement afterward but unfortunately had ERAF. She is now here for Tikosyn prep, recommendation of Dr. Quentin Ore after consult on 12/6. Amiodarone thought to not be a good choice with previous lung cancer.   She states no missed anticoagulation. No benadryl use. EKg shows afib at 74 bpm with a qt of 439 ms.  Today, she denies symptoms of palpitations, chest pain, shortness of breath, orthopnea, PND, lower extremity edema, dizziness, presyncope, syncope, or neurologic sequela. The patient is tolerating medications without difficulties and is otherwise without complaint today.   Past Medical History:  Diagnosis Date   Barrett esophagus    Depression    Generalized OA    HTN (hypertension)    Hyperlipemia    Hypothyroid    Insomnia    Lung cancer ([place]) 11/2015   treated with radiation   Personal history of radiation therapy 2017   lung Ca   Uterine cancer ([place]) 1981   uterus ca with a hysterectomy   Past Surgical History:  Procedure Laterality Date   ABDOMINAL HYSTERECTOMY  1984   [place], [place]   APPENDECTOMY     BREAST BIOPSY Right 1980's   neg   CARDIOVERSION N/A 09/20/2022   Procedure: CARDIOVERSION;  Surgeon: Minna Merritts, MD;  Location: [facility] ORS;  Service: Cardiovascular;  Laterality: N/A;   KNEE ARTHROSCOPY Right 2000   [place]   right breast bx      Current Outpatient Medications  Medication Sig Dispense Refill   amLODipine (NORVASC) 5 MG tablet Take 1 tablet (5 mg total) by mouth daily. 90 tablet 3   Calcium Carbonate-Vitamin D 500-125 MG-UNIT TABS Take by mouth.     gabapentin (NEURONTIN) 100 MG capsule Take 1 capsule by mouth as needed.     HYDROcodone-acetaminophen (NORCO/VICODIN) 5-325 MG tablet Take 1 tablet by mouth daily as needed for  moderate pain.      levothyroxine (SYNTHROID) 100 MCG tablet Take 100 mcg by mouth daily.     losartan (COZAAR) 25 MG tablet Take 1 tablet (25 mg total) by mouth at bedtime. (Patient taking differently: Take 25 mg by mouth at bedtime. Only takes if blood pressure is up.) 90 tablet 3   Multiple Vitamin (MULTI-VITAMINS) TABS Take 1 tablet by mouth daily.     omeprazole (PRILOSEC) 20 MG capsule Take 40 mg by mouth daily as needed.      rivaroxaban (XARELTO) 15 MG TABS tablet Take 1 tablet (15 mg total) by mouth daily with supper. 90 tablet 1   sertraline (ZOLOFT) 100 MG tablet Take 100 mg by mouth daily.     temazepam (RESTORIL) 15 MG capsule Take 15 mg by mouth at bedtime as needed for sleep.      tetrahydrozoline 0.05 % ophthalmic solution Place 1 drop into both eyes.     VITAMIN D PO Take 1 tablet by mouth daily.     No current facility-administered medications for this encounter.    Allergies  Allergen Reactions   Atorvastatin Other (See Comments)    Muscle Aches   Bupropion Other (See Comments)    Unspecified   Other Other (See Comments)    Unspecified, passed out Reaction: patient was unable to communicate and family member was unable to tell me any allergies  Percodan [Oxycodone-Aspirin]    Pravastatin Other (See Comments)    Muscle Aches    Social History   Socioeconomic History   Marital status: Widowed    Spouse name: Not on file   Number of children: Not on file   Years of education: Not on file   Highest education level: Not on file  Occupational History   Not on file  Tobacco Use   Smoking status: Never   Smokeless tobacco: Never  Vaping Use   Vaping Use: Never used  Substance and Sexual Activity   Alcohol use: No    Alcohol/week: 0.0 standard drinks of alcohol   Drug use: No   Sexual activity: Not on file  Other Topics Concern   Not on file  Social History Narrative   Lives alone.    Social Determinants of Health   Financial Resource Strain: Not on  file  Food Insecurity: Not on file  Transportation Needs: Not on file  Physical Activity: Not on file  Stress: Not on file  Social Connections: Not on file  Intimate Partner Violence: Not on file    Family History  Problem Relation Age of Onset   Hypertension Other    Breast cancer Maternal Aunt    Breast cancer Sister 52    ROS- All systems are reviewed and negative except as per the HPI above  Physical Exam: There were no vitals filed for this visit. Wt Readings from Last 3 Encounters:  10/31/22 64 kg  10/02/22 63.2 kg  09/20/22 63.5 kg    Labs: Lab Results  Component Value Date   NA 138 04/18/2016   K 4.0 04/18/2016   CL 104 04/18/2016   CO2 26 04/18/2016   GLUCOSE 98 04/18/2016   BUN 16 04/18/2016   CREATININE 1.00 09/30/2020   CALCIUM 9.6 04/18/2016   Lab Results  Component Value Date   INR 1.02 04/18/2016   No results found for: "CHOL", "HDL", "LDLCALC", "TRIG"   GEN- The patient is well appearing, alert and oriented x 3 today.   Head- normocephalic, atraumatic Eyes-  Sclera clear, conjunctiva pink Ears- hearing intact Oropharynx- clear Neck- supple, no JVP Lymph- no cervical lymphadenopathy Lungs- Clear to ausculation bilaterally, normal work of breathing Heart- irregular rate and rhythm, no murmurs, rubs or gallops, PMI not laterally displaced GI- soft, NT, ND, + BS Extremities- no clubbing, cyanosis, or edema MS- no significant deformity or atrophy Skin- no rash or lesion Psych- euthymic mood, full affect Neuro- strength and sensation are intact  EKG-Vent. rate 74 BPM PR interval * ms QRS duration 88 ms QT/QTcB 396/439 ms P-R-T axes * -45 54 Atrial fibrillation with a competing junctional pacemaker Left anterior fascicular block Minimal voltage criteria for LVH, may be normal variant ( R in aVL ) Septal infarct , age undetermined Abnormal ECG When compared with ECG of 20-Sep-2022 07:42, PREVIOUS ECG IS PRESENT    Assessment and  Plan:  1. Persistent Afib  Present today for tikosyn admit, previoulsy discussed with Dr. Quentin Ore  No benadryl use General procedure for tikosyn admit discussed with pt and daughter Qtc acceptable Bmet/mag are pending   2. CHA2DS2VASc  score of at least 5 Has not missed any anticoagulation for 21 days Continue xarelto 15 mg daily, takes in am   To [place]. Dedria Endres, [facility] [address] [place], [place] [address] [phone]

## 2022-12-04 NOTE — Care Management (Signed)
  Transition of Care [facility]) Screening Note   Patient Details  Name: Lauren Hammond Date of Birth: 1932/08/05   Transition of Care [facility]) CM/SW Contact:    Bethena Roys, RN Phone Number: 12/04/2022, 2:22 PM    Transition of Care Department [facility]) has reviewed the patient. Patient presented for Tikosyn Load. Benefits check submitted for cost- $41.36. Case Manager will discuss cost and pharmacy of choice as the patient progresses.

## 2022-12-04 NOTE — H&P (Signed)
Primary Care Physician: Tracie Harrier, MD Referring Physician: Dr. Lajuana Ripple is a 89 y.o. female with a h/o new onset afib in the fall of 2023. She  had a cardioversion and saw improvement afterward but unfortunately had ERAF. She is now here for Tikosyn prep, recommendation of Dr. Quentin Ore after consult on 12/6. Amiodarone thought to not be a good choice with previous lung cancer.    She states no missed anticoagulation. No benadryl use. EKg shows afib at 74 bpm with a qt of 439 ms.   Today, she denies symptoms of palpitations, chest pain, shortness of breath, orthopnea, PND, lower extremity edema, dizziness, presyncope, syncope, or neurologic sequela. The patient is tolerating medications without difficulties and is otherwise without complaint today.        Past Medical History:  Diagnosis Date   Barrett esophagus     Depression     Generalized OA     HTN (hypertension)     Hyperlipemia     Hypothyroid     Insomnia     Lung cancer ([place]) 11/2015    treated with radiation   Personal history of radiation therapy 2017    lung Ca   Uterine cancer ([place]) 1981    uterus ca with a hysterectomy         Past Surgical History:  Procedure Laterality Date   ABDOMINAL HYSTERECTOMY   1984    [place], [place]   APPENDECTOMY       BREAST BIOPSY Right 1980's    neg   CARDIOVERSION N/A 09/20/2022    Procedure: CARDIOVERSION;  Surgeon: Minna Merritts, MD;  Location: [facility] ORS;  Service: Cardiovascular;  Laterality: N/A;   KNEE ARTHROSCOPY Right 2000    [place]   right breast bx                Current Outpatient Medications  Medication Sig Dispense Refill   amLODipine (NORVASC) 5 MG tablet Take 1 tablet (5 mg total) by mouth daily. 90 tablet 3   Calcium Carbonate-Vitamin D 500-125 MG-UNIT TABS Take by mouth.       gabapentin (NEURONTIN) 100 MG capsule Take 1 capsule by mouth as needed.       HYDROcodone-acetaminophen (NORCO/VICODIN) 5-325 MG tablet  Take 1 tablet by mouth daily as needed for moderate pain.        levothyroxine (SYNTHROID) 100 MCG tablet Take 100 mcg by mouth daily.       losartan (COZAAR) 25 MG tablet Take 1 tablet (25 mg total) by mouth at bedtime. (Patient taking differently: Take 25 mg by mouth at bedtime. Only takes if blood pressure is up.) 90 tablet 3   Multiple Vitamin (MULTI-VITAMINS) TABS Take 1 tablet by mouth daily.       omeprazole (PRILOSEC) 20 MG capsule Take 40 mg by mouth daily as needed.        rivaroxaban (XARELTO) 15 MG TABS tablet Take 1 tablet (15 mg total) by mouth daily with supper. 90 tablet 1   sertraline (ZOLOFT) 100 MG tablet Take 100 mg by mouth daily.       temazepam (RESTORIL) 15 MG capsule Take 15 mg by mouth at bedtime as needed for sleep.        tetrahydrozoline 0.05 % ophthalmic solution Place 1 drop into both eyes.       VITAMIN D PO Take 1 tablet by mouth daily.        No  current facility-administered medications for this encounter.           Allergies  Allergen Reactions   Atorvastatin Other (See Comments)      Muscle Aches   Bupropion Other (See Comments)      Unspecified   Other Other (See Comments)      Unspecified, passed out Reaction: patient was unable to communicate and family member was unable to tell me any allergies    Percodan [Oxycodone-Aspirin]     Pravastatin Other (See Comments)      Muscle Aches      Social History         Socioeconomic History   Marital status: Widowed      Spouse name: Not on file   Number of children: Not on file   Years of education: Not on file   Highest education level: Not on file  Occupational History   Not on file  Tobacco Use   Smoking status: Never   Smokeless tobacco: Never  Vaping Use   Vaping Use: Never used  Substance and Sexual Activity   Alcohol use: No      Alcohol/week: 0.0 standard drinks of alcohol   Drug use: No   Sexual activity: Not on file  Other Topics Concern   Not on file  Social History Narrative     Lives alone.     Social Determinants of Health    Financial Resource Strain: Not on file  Food Insecurity: Not on file  Transportation Needs: Not on file  Physical Activity: Not on file  Stress: Not on file  Social Connections: Not on file  Intimate Partner Violence: Not on file           Family History  Problem Relation Age of Onset   Hypertension Other     Breast cancer Maternal Aunt     Breast cancer Sister 8      ROS- All systems are reviewed and negative except as per the HPI above   Physical Exam: There were no vitals filed for this visit.    Wt Readings from Last 3 Encounters:  10/31/22 64 kg  10/02/22 63.2 kg  09/20/22 63.5 kg      Labs: Recent Labs       Lab Results  Component Value Date    NA 138 04/18/2016    K 4.0 04/18/2016    CL 104 04/18/2016    CO2 26 04/18/2016    GLUCOSE 98 04/18/2016    BUN 16 04/18/2016    CREATININE 1.00 09/30/2020    CALCIUM 9.6 04/18/2016      Recent Labs       Lab Results  Component Value Date    INR 1.02 04/18/2016      Recent Labs  No results found for: "CHOL", "HDL", "LDLCALC", "TRIG"       GEN- The patient is well appearing, alert and oriented x 3 today.   Head- normocephalic, atraumatic Eyes-  Sclera clear, conjunctiva pink Ears- hearing intact Oropharynx- clear Neck- supple, no JVP Lymph- no cervical lymphadenopathy Lungs- Clear to ausculation bilaterally, normal work of breathing Heart- irregular rate and rhythm, no murmurs, rubs or gallops, PMI not laterally displaced GI- soft, NT, ND, + BS Extremities- no clubbing, cyanosis, or edema MS- no significant deformity or atrophy Skin- no rash or lesion Psych- euthymic mood, full affect Neuro- strength and sensation are intact   EKG-Vent. rate 74 BPM PR interval * ms QRS duration 88 ms QT/QTcB 396/439 ms  P-R-T axes * -45 54 Atrial fibrillation with a competing junctional pacemaker Left anterior fascicular block Minimal voltage criteria  for LVH, may be normal variant ( R in aVL ) Septal infarct , age undetermined Abnormal ECG When compared with ECG of 20-Sep-2022 07:42, PREVIOUS ECG IS PRESENT       Assessment and Plan:  1. Persistent Afib  Present today for tikosyn admit, previoulsy discussed with Dr. Quentin Ore  No benadryl use General procedure for tikosyn admit discussed with pt and daughter Qtc acceptable Bmet/mag are pending    2. CHA2DS2VASc  score of at least 5 Has not missed any anticoagulation for 21 days Continue xarelto 15 mg daily, takes in am    To [place]. [place], [facility] [address] [place], [place] [address] [phone]   -------------------------------------------  I have seen, examined the patient, and reviewed the above assessment and plan.    Presents for dofetilide loading.  GEN: No acute distress.   Cardiac: irregularly irregular, no murmurs, rubs, or gallops.  Respiratory: Clear to auscultation bilaterally. Psych: Normal affect   ECG shows AF. QTc 473ms.  #Persistent AF Start dofetilide 177mcg po bid.  Continue xarelto.    Vickie Epley, MD 12/04/2022 9:55 PM

## 2022-12-04 NOTE — Plan of Care (Signed)

## 2022-12-04 NOTE — Plan of Care (Signed)

## 2022-12-05 DIAGNOSIS — I4819 Other persistent atrial fibrillation: Secondary | ICD-10-CM | POA: Diagnosis not present

## 2022-12-05 LAB — BASIC METABOLIC PANEL
Anion gap: 10 (ref 5–15)
BUN: 31 mg/dL — ABNORMAL HIGH (ref 8–23)
CO2: 22 mmol/L (ref 22–32)
Calcium: 9.1 mg/dL (ref 8.9–10.3)
Chloride: 105 mmol/L (ref 98–111)
Creatinine, Ser: 1.1 mg/dL — ABNORMAL HIGH (ref 0.44–1.00)
GFR, Estimated: 48 mL/min — ABNORMAL LOW (ref >60)
Glucose, Bld: 125 mg/dL — ABNORMAL HIGH (ref 70–99)
Potassium: 4 mmol/L (ref 3.5–5.1)
Sodium: 137 mmol/L (ref 135–145)

## 2022-12-05 LAB — MAGNESIUM: Magnesium: 2.1 mg/dL (ref 1.7–2.4)

## 2022-12-05 MED ORDER — SODIUM CHLORIDE 0.9 % IV SOLN: INTRAVENOUS | Status: DC

## 2022-12-05 MED ORDER — DOCUSATE SODIUM 100 MG PO CAPS
100.0000 mg | ORAL_CAPSULE | Freq: Every day | ORAL | Status: DC | PRN
  Administered 2022-12-05: 100 mg via ORAL
  Filled 2022-12-05: qty 1

## 2022-12-05 NOTE — Progress Notes (Signed)
Patient wanted to discuss her code status. She tells me that she is a DNR status, and would be her preference. We discussed in detail tikosyn initiation and small chance of dangerous heart rhythms that would be correctable with defibrillation during her load, as a medication side effect and correctable. She states understanding and would like to remain a full code status during her loading process.  Tommye Standard, PA-C

## 2022-12-05 NOTE — Progress Notes (Signed)
Mobility Specialist - Progress Note   12/05/22 1506  Mobility  Activity Ambulated with assistance in hallway  Level of Assistance Standby assist, set-up cues, supervision of patient - no hands on  Assistive Device Front wheel walker  Distance Ambulated (ft) 300 ft  Activity Response Tolerated well  Mobility Referral Yes  $Mobility charge 1 Mobility   Pt received at doorway and agreeable to session. No complaints throughout. Pt was returned to room with all needs met.   Franki Monte  Mobility Specialist Please contact via Solicitor or Rehab office at [phone]

## 2022-12-05 NOTE — Progress Notes (Signed)
Post dose EKG is reviewed Stable Qtc NPO after MN is ordered DCCV tomorrow if not in [place], [place]

## 2022-12-05 NOTE — Progress Notes (Signed)
Pharmacy: Dofetilide (Tikosyn) - Follow Up Assessment and Electrolyte Replacement  Pharmacy consulted to assist in monitoring and replacing electrolytes in this 89 y.o. female admitted on 12/04/2022 undergoing dofetilide initiation. First dofetilide dose: 12/05/2022@2055 .  Labs:    Component Value Date/Time   K 4.0 12/05/2022 0137   MG 2.1 12/05/2022 0137     Plan: Potassium: K >/= 4: No additional supplementation needed  Magnesium: Mg > 2: No additional supplementation needed   Thank you for allowing pharmacy to participate in this patient's care   Antonietta Jewel, PharmD, [place] Pharmacist  Phone: [phone] 12/05/2022 7:34 AM  Please check AMION for all [place] phone numbers After 10:00 PM, call [place] [phone]

## 2022-12-05 NOTE — Progress Notes (Addendum)
Rounding Note    Patient Name: Lauren Hammond Date of Encounter: 12/05/2022  [place] Cardiologist: Ida Rogue, MD   Subjective   No complaints, tolerating drug  Inpatient Medications    Scheduled Meds:  amLODipine  5 mg Oral Daily   dofetilide  125 mcg Oral BID   levothyroxine  100 mcg Oral Daily   rivaroxaban  15 mg Oral Q breakfast   sertraline  100 mg Oral Daily   sodium chloride flush  3 mL Intravenous Q12H   Continuous Infusions:  sodium chloride     PRN Meds: sodium chloride, docusate sodium, HYDROcodone-acetaminophen, losartan, sodium chloride flush, temazepam   Vital Signs    Vitals:   12/04/22 2100 12/04/22 2312 12/05/22 0621 12/05/22 0806  BP: 128/60 127/67 123/81 128/72  Pulse: 65 66 63   Resp: 18 16 15    Temp: 97.7 F (36.5 C) (!) 97.5 F (36.4 C) (!) 97.3 F (36.3 C)   TempSrc: Oral Oral Oral   SpO2: 96% 95% 96%   Weight:      Height:        Intake/Output Summary (Last 24 hours) at 12/05/2022 1006 Last data filed at 12/04/2022 2100 Gross per 24 hour  Intake 1200 ml  Output --  Net 1200 ml      12/04/2022   11:20 AM 12/04/2022    9:30 AM 10/31/2022    9:46 AM  Last 3 Weights  Weight (lbs) 145 lb 8.1 oz 143 lb 3.2 oz 141 lb  Weight (kg) 66 kg 64.955 kg 63.957 kg      Telemetry    AFib 60's-80's - Personally Reviewed  ECG    AFib 69bpm, Qtc 484 - Personally Reviewed with Dr. Quentin Ore  Physical Exam   GEN: No acute distress.   Neck: No JVD Cardiac: irreg-irreg, no murmurs, rubs, or gallops.  Respiratory: CTA b/l. GI: Soft, nontender, non-distended  MS: No edema; No deformity. Neuro:  Nonfocal  Psych: Normal affect   Labs    High Sensitivity Troponin:  No results for input(s): "TROPONINIHS" in the last 720 hours.   Chemistry Recent Labs  Lab 12/04/22 1050 12/04/22 1618 12/05/22 0137  NA 137 136 137  K 3.6 4.3 4.0  CL 102 102 105  CO2 27 25 22   GLUCOSE 116* 107* 125*  BUN 28* 29* 31*  CREATININE 0.97  1.25* 1.10*  CALCIUM 9.6 9.0 9.1  MG 2.3  --  2.1  GFRNONAA 56* 41* 48*  ANIONGAP 8 9 10     Lipids No results for input(s): "CHOL", "TRIG", "HDL", "LABVLDL", "LDLCALC", "CHOLHDL" in the last 168 hours.  HematologyNo results for input(s): "WBC", "RBC", "HGB", "HCT", "MCV", "MCH", "MCHC", "RDW", "PLT" in the last 168 hours. Thyroid No results for input(s): "TSH", "FREET4" in the last 168 hours.  BNPNo results for input(s): "BNP", "PROBNP" in the last 168 hours.  DDimer No results for input(s): "DDIMER" in the last 168 hours.   Radiology    No results found.  Cardiac Studies   08/16/22: TTE 1. Left ventricular ejection fraction, by estimation, is 60 to 65%. The  left ventricle has normal function. The left ventricle has no regional  wall motion abnormalities. There is mild asymmetric left ventricular  hypertrophy of the basal-septal segment.  Left ventricular diastolic parameters are indeterminate.   2. Right ventricular systolic function is normal. The right ventricular  size is normal. There is mildly elevated pulmonary artery systolic  pressure. The estimated right ventricular systolic pressure  is 38.6 mmHg.   3. Left atrial size was mildly dilated.   4. The mitral valve is normal in structure. Mild mitral valve  regurgitation. No evidence of mitral stenosis.   5. Tricuspid valve regurgitation is mild to moderate.   6. The aortic valve is normal in structure. Aortic valve regurgitation is  not visualized. No aortic stenosis is present.   7. The inferior vena cava is normal in size with greater than 50%  respiratory variability, suggesting right atrial pressure of 3 mmHg.   Patient Profile     89 y.o. female w/PMHx of HTN, HLD, hx of lung Ca and uterine cancer (treated) and fairly new onste Afib admitted for Tikosyn initiation  Assessment & Plan    Persistent AFib CHA2DS2Vasc is 4, on xarelto, appropriately dosed, she confirms, no missed doses, took yesterday AM at home prior  to coming in Tikosyn load is in progress K+ 4.0 Mag 2.1 Creat 1.1 QTc stable  DCCV tomorrow if not in SR, pt is aware and agreeable   HTN Looks OK Home meds    For questions or updates, please contact [place] Please consult [email or web address] for contact info under        Signed, Baldwin Jamaica, PA-C  12/05/2022, 10:06 AM

## 2022-12-06 ENCOUNTER — Ambulatory Visit (HOSPITAL_COMMUNITY): Admit: 2022-12-06 | Payer: Medicare Other | Admitting: Internal Medicine

## 2022-12-06 ENCOUNTER — Inpatient Hospital Stay (HOSPITAL_COMMUNITY): Payer: Medicare Other | Admitting: Anesthesiology

## 2022-12-06 ENCOUNTER — Encounter (HOSPITAL_COMMUNITY): Payer: Self-pay | Admitting: Cardiology

## 2022-12-06 ENCOUNTER — Encounter (HOSPITAL_COMMUNITY)
Admission: RE | Disposition: A | Discharge status: Home / Self Care | Payer: Self-pay | Source: Ambulatory Visit | Attending: Nurse Practitioner

## 2022-12-06 DIAGNOSIS — I4891 Unspecified atrial fibrillation: Secondary | ICD-10-CM

## 2022-12-06 DIAGNOSIS — I10 Essential (primary) hypertension: Secondary | ICD-10-CM

## 2022-12-06 DIAGNOSIS — I4819 Other persistent atrial fibrillation: Secondary | ICD-10-CM | POA: Diagnosis not present

## 2022-12-06 DIAGNOSIS — I4892 Unspecified atrial flutter: Secondary | ICD-10-CM

## 2022-12-06 DIAGNOSIS — I2510 Atherosclerotic heart disease of native coronary artery without angina pectoris: Secondary | ICD-10-CM

## 2022-12-06 HISTORY — PX: CARDIOVERSION: SHX1299

## 2022-12-06 LAB — BASIC METABOLIC PANEL
Anion gap: 8 (ref 5–15)
BUN: 27 mg/dL — ABNORMAL HIGH (ref 8–23)
CO2: 24 mmol/L (ref 22–32)
Calcium: 9.4 mg/dL (ref 8.9–10.3)
Chloride: 103 mmol/L (ref 98–111)
Creatinine, Ser: 0.86 mg/dL (ref 0.44–1.00)
GFR, Estimated: >60 mL/min (ref >60)
Glucose, Bld: 91 mg/dL (ref 70–99)
Potassium: 4 mmol/L (ref 3.5–5.1)
Sodium: 135 mmol/L (ref 135–145)

## 2022-12-06 LAB — MAGNESIUM: Magnesium: 2.1 mg/dL (ref 1.7–2.4)

## 2022-12-06 SURGERY — CARDIOVERSION
Anesthesia: General

## 2022-12-06 MED ORDER — PROPOFOL 10 MG/ML IV BOLUS
INTRAVENOUS | Status: DC | PRN
  Administered 2022-12-06: 50 mg via INTRAVENOUS

## 2022-12-06 MED ORDER — LIDOCAINE 2% (20 MG/ML) 5 ML SYRINGE
INTRAMUSCULAR | Status: DC | PRN
  Administered 2022-12-06: 40 mg via INTRAVENOUS

## 2022-12-06 NOTE — Anesthesia Postprocedure Evaluation (Signed)
Anesthesia Post Note  Patient: Lauren Hammond  Procedure(s) Performed: CARDIOVERSION     Patient location during evaluation: PACU Anesthesia Type: General Level of consciousness: awake and alert Pain management: pain level controlled Vital Signs Assessment: post-procedure vital signs reviewed and stable Respiratory status: spontaneous breathing, nonlabored ventilation, respiratory function stable and patient connected to nasal cannula oxygen Cardiovascular status: blood pressure returned to baseline and stable Postop Assessment: no apparent nausea or vomiting Anesthetic complications: no  No notable events documented.  Last Vitals:  Vitals:   12/06/22 1210 12/06/22 1224  BP: 122/62   Pulse: 63 68  Resp: 12 16  Temp:    SpO2: 96%     Last Pain:  Vitals:   12/06/22 1224  TempSrc: Oral  PainSc:                  Effie Berkshire

## 2022-12-06 NOTE — Progress Notes (Signed)
Mobility Specialist - Progress Note   12/06/22 0948  Mobility  Activity Contraindicated/medical hold   Pt currently nausea and unable to participate in mobility. Will follow up if time permits.  Franki Monte  Mobility Specialist Please contact via Solicitor or Rehab office at [phone]

## 2022-12-06 NOTE — Progress Notes (Signed)
Post dose EKG is reviewed  Stable QTc  Post DCCV EKG also reviewed Stable QTc  OK for Tikosyn this evening  Tommye Standard, PA-C

## 2022-12-06 NOTE — Progress Notes (Signed)
Pt refused to go down for cardioversion due to having nausea.

## 2022-12-06 NOTE — CV Procedure (Signed)
    CARDIOVERSION NOTE  Procedure: Electrical Cardioversion Indications:  Atrial Fibrillation  Procedure Details:  Consent: Risks of procedure as well as the alternatives and risks of each were explained to the (patient/caregiver).  Consent for procedure obtained.  Time Out: Verified patient identification, verified procedure, site/side was marked, verified correct patient position, special equipment/implants available, medications/allergies/relevent history reviewed, required imaging and test results available.  Performed  Patient placed on cardiac monitor, pulse oximetry, supplemental oxygen as necessary.  Sedation given:  propofol per anesthesia Pacer pads placed anterior and posterior chest.  Cardioverted 1 time(s).  Cardioverted at 150J biphasic.  Impression: Findings: Post procedure EKG shows: NSR Complications: None Patient did tolerate procedure well.  Plan: Successful DCCV with a single 150J biphasic shock to NSR.  Time Spent Directly with the Patient:  30 minutes   Pixie Casino, MD, [facility], [place] Director of the [facility] Lipid [facility] &  Cardiovascular [facility] Clinic Diplomate of the American Board of Clinical Lipidology Attending Cardiologist  Direct Dial: [phone]  Fax: [phone]  Website:  [email or web address] 12/06/2022, 11:42 AM

## 2022-12-06 NOTE — Care Management (Signed)
12-06-22 1136 Case Manager spoke with patient on yesterday regarding Tikosyn cost. Patient asked Case Manager to call daughter Davy Pique regarding Tikosyn cost. Daughter called this morning and would like for the patients initial Rx to be filled via [place] for 90 day supply escribed to [facility]. No further needs identified at this time.

## 2022-12-06 NOTE — Anesthesia Preprocedure Evaluation (Addendum)
Anesthesia Evaluation  Patient identified by MRN, date of birth, ID band Patient awake    Reviewed: Allergy & Precautions, NPO status , Patient's Chart, lab work & pertinent test results  Airway Mallampati: I       Dental  (+) Teeth Intact   Pulmonary COPD   breath sounds clear to auscultation       Cardiovascular hypertension, Pt. on medications + CAD   Rhythm:Irregular Rate:Abnormal  Echo:  1. Left ventricular ejection fraction, by estimation, is 60 to 65%. The  left ventricle has normal function. The left ventricle has no regional  wall motion abnormalities. There is mild asymmetric left ventricular  hypertrophy of the basal-septal segment.  Left ventricular diastolic parameters are indeterminate.   2. Right ventricular systolic function is normal. The right ventricular  size is normal. There is mildly elevated pulmonary artery systolic  pressure. The estimated right ventricular systolic pressure is 88.8 mmHg.   3. Left atrial size was mildly dilated.   4. The mitral valve is normal in structure. Mild mitral valve  regurgitation. No evidence of mitral stenosis.   5. Tricuspid valve regurgitation is mild to moderate.   6. The aortic valve is normal in structure. Aortic valve regurgitation is  not visualized. No aortic stenosis is present.   7. The inferior vena cava is normal in size with greater than 50%  respiratory variability, suggesting right atrial pressure of 3 mmHg.     Neuro/Psych  PSYCHIATRIC DISORDERS  Depression    negative neurological ROS     GI/Hepatic negative GI ROS, Neg liver ROS,,,  Endo/Other  Hypothyroidism    Renal/GU negative Renal ROS     Musculoskeletal  (+) Arthritis ,    Abdominal   Peds  Hematology negative hematology ROS (+)   Anesthesia Other Findings   Reproductive/Obstetrics                             Anesthesia Physical Anesthesia Plan  ASA:  3  Anesthesia Plan: General   Post-op Pain Management: Minimal or no pain anticipated   Induction: Intravenous  PONV Risk Score and Plan: 0 and Treatment may vary due to age or medical condition  Airway Management Planned: Natural Airway and Simple Face Mask  Additional Equipment: None  Intra-op Plan:   Post-operative Plan:   Informed Consent: I have reviewed the patients History and Physical, chart, labs and discussed the procedure including the risks, benefits and alternatives for the proposed anesthesia with the patient or authorized representative who has indicated his/her understanding and acceptance.       Plan Discussed with: CRNA  Anesthesia Plan Comments:        Anesthesia Quick Evaluation

## 2022-12-06 NOTE — Plan of Care (Signed)

## 2022-12-06 NOTE — Interval H&P Note (Signed)
History and Physical Interval Note:  12/06/2022 11:33 AM  Lauren Hammond  has presented today for surgery, with the diagnosis of afib/aflutter.  The various methods of treatment have been discussed with the patient and family. After consideration of risks, benefits and other options for treatment, the patient has consented to  Procedure(s): CARDIOVERSION (N/A) as a surgical intervention.  The patient's history has been reviewed, patient examined, no change in status, stable for surgery.  I have reviewed the patient's chart and labs.  Questions were answered to the patient's satisfaction.     Pixie Casino

## 2022-12-06 NOTE — Progress Notes (Signed)
Pharmacy: Dofetilide (Tikosyn) - Follow Up Assessment and Electrolyte Replacement  Pharmacy consulted to assist in monitoring and replacing electrolytes in this 89 y.o. female admitted on 12/04/2022 undergoing dofetilide initiation. First dofetilide dose: 12/05/2022@2055 .  Labs:    Component Value Date/Time   K 4.0 12/06/2022 0153   MG 2.1 12/06/2022 0153     Plan: Potassium: K >/= 4: No additional supplementation needed  Magnesium: Mg > 2: No additional supplementation needed  Thank you for allowing pharmacy to participate in this patient's care.  Reatha Harps, PharmD PGY2 Pharmacy Resident 12/06/2022 6:46 AM Check [email or web address] for unit specific pharmacy number

## 2022-12-06 NOTE — Transfer of Care (Signed)
Immediate Anesthesia Transfer of Care Note  Patient: Lauren Hammond  Procedure(s) Performed: CARDIOVERSION  Patient Location: Endoscopy Unit  Anesthesia Type:General  Level of Consciousness: awake, drowsy, and patient cooperative  Airway & Oxygen Therapy: Patient Spontanous Breathing  Post-op Assessment: Report given to RN, Post -op Vital signs reviewed and stable, and Patient moving all extremities X 4  Post vital signs: Reviewed and stable  Last Vitals:  Vitals Value Taken Time  BP    Temp    Pulse    Resp    SpO2      Last Pain:  Vitals:   12/06/22 1120  TempSrc: Temporal  PainSc: 0-No pain      Patients Stated Pain Goal: 0 (26/20/35 5974)  Complications: No notable events documented.

## 2022-12-06 NOTE — Progress Notes (Signed)
Rounding Note    Patient Name: Lauren Hammond Date of Encounter: 12/06/2022   [facility] Cardiologist: Ida Rogue, MD   Subjective   No complaints, tolerating drug, feels a little SOB this AM, O2 sat 99%, RA  Inpatient Medications    Scheduled Meds:  amLODipine  5 mg Oral Daily   dofetilide  125 mcg Oral BID   levothyroxine  100 mcg Oral Daily   rivaroxaban  15 mg Oral Q breakfast   sertraline  100 mg Oral Daily   sodium chloride flush  3 mL Intravenous Q12H   Continuous Infusions:  sodium chloride     sodium chloride     PRN Meds: sodium chloride, docusate sodium, HYDROcodone-acetaminophen, losartan, sodium chloride flush, temazepam   Vital Signs    Vitals:   12/05/22 0806 12/05/22 1234 12/05/22 2046 12/06/22 0434  BP: 128/72 (!) 147/76 (!) 147/71 121/67  Pulse:  72 60 63  Resp:  18 18 17   Temp:   97.7 F (36.5 C) (!) 97.3 F (36.3 C)  TempSrc:   Oral Oral  SpO2:   96% 95%  Weight:      Height:        Intake/Output Summary (Last 24 hours) at 12/06/2022 0823 Last data filed at 12/05/2022 2045 Gross per 24 hour  Intake 360 ml  Output --  Net 360 ml      12/04/2022   11:20 AM 12/04/2022    9:30 AM 10/31/2022    9:46 AM  Last 3 Weights  Weight (lbs) 145 lb 8.1 oz 143 lb 3.2 oz 141 lb  Weight (kg) 66 kg 64.955 kg 63.957 kg      Telemetry    AFib 50's-70's - Personally Reviewed  ECG    AFib 64bpm, Qtc 470 - Personally Reviewed with Dr. Quentin Ore  Physical Exam   GEN: No acute distress.   Neck: No JVD Cardiac: irreg-irreg, no murmurs, rubs, or gallops.  Respiratory: CTA b/l, no crackles, wheezes, lungs are clear GI: Soft, nontender, non-distended  MS: No edema; No deformity. Neuro:  Nonfocal  Psych: Normal affect   Labs    High Sensitivity Troponin:  No results for input(s): "TROPONINIHS" in the last 720 hours.   Chemistry Recent Labs  Lab 12/04/22 1050 12/04/22 1618 12/05/22 0137 12/06/22 0153  NA 137 136 137 135  K 3.6  4.3 4.0 4.0  CL 102 102 105 103  CO2 27 25 22 24   GLUCOSE 116* 107* 125* 91  BUN 28* 29* 31* 27*  CREATININE 0.97 1.25* 1.10* 0.86  CALCIUM 9.6 9.0 9.1 9.4  MG 2.3  --  2.1 2.1  GFRNONAA 56* 41* 48* >60  ANIONGAP 8 9 10 8     Lipids No results for input(s): "CHOL", "TRIG", "HDL", "LABVLDL", "LDLCALC", "CHOLHDL" in the last 168 hours.  HematologyNo results for input(s): "WBC", "RBC", "HGB", "HCT", "MCV", "MCH", "MCHC", "RDW", "PLT" in the last 168 hours. Thyroid No results for input(s): "TSH", "FREET4" in the last 168 hours.  BNPNo results for input(s): "BNP", "PROBNP" in the last 168 hours.  DDimer No results for input(s): "DDIMER" in the last 168 hours.   Radiology    No results found.  Cardiac Studies   08/16/22: TTE 1. Left ventricular ejection fraction, by estimation, is 60 to 65%. The  left ventricle has normal function. The left ventricle has no regional  wall motion abnormalities. There is mild asymmetric left ventricular  hypertrophy of the basal-septal segment.  Left ventricular diastolic parameters  are indeterminate.   2. Right ventricular systolic function is normal. The right ventricular  size is normal. There is mildly elevated pulmonary artery systolic  pressure. The estimated right ventricular systolic pressure is 37.1 mmHg.   3. Left atrial size was mildly dilated.   4. The mitral valve is normal in structure. Mild mitral valve  regurgitation. No evidence of mitral stenosis.   5. Tricuspid valve regurgitation is mild to moderate.   6. The aortic valve is normal in structure. Aortic valve regurgitation is  not visualized. No aortic stenosis is present.   7. The inferior vena cava is normal in size with greater than 50%  respiratory variability, suggesting right atrial pressure of 3 mmHg.   Patient Profile     89 y.o. female w/PMHx of HTN, HLD, hx of lung Ca and uterine cancer (treated) and fairly new onste Afib admitted for Tikosyn initiation  Assessment &  Plan    Persistent AFib CHA2DS2Vasc is 4, on xarelto, appropriately dosed, she confirms, no missed doses, took yesterday AM at home prior to coming in Tikosyn load is in progress K+ 4.0 Mag 2.1 Creat 0.86 QTc stable  DCCV today, she remains agreeable Anticipate discharge tomorrow   HTN Looks OK Home meds    For questions or updates, please contact [place] Please consult [email or web address] for contact info under        Signed, Baldwin Jamaica, PA-C  12/06/2022, 8:23 AM

## 2022-12-06 NOTE — Discharge Summary (Signed)
ELECTROPHYSIOLOGY PROCEDURE DISCHARGE SUMMARY    Patient ID: Lauren Hammond,  MRN: [id number], DOB/AGE: 03-28-1932 89 y.o.  Admit date: 12/04/2022 Discharge date: 12/07/22  Primary Care Physician: Barbette Reichmann, MD  Primary Cardiologist: Dr. Mariah Milling Electrophysiologist: Dr. Lalla Brothers  Primary Discharge Diagnosis:  1.  persistent atrial fibrillation status post Tikosyn loading this admission      CHA2DS2Vasc is 4, on Xarelto  Secondary Discharge Diagnosis:  HTN Lung and uterine ca (Both treated) HLD  Allergies  Allergen Reactions   Atorvastatin Other (See Comments)    Muscle Aches   Bupropion Other (See Comments)    Unspecified   Other Other (See Comments)    Unspecified, passed out Reaction: patient was unable to communicate and family member was unable to tell me any allergies    Percodan [Oxycodone-Aspirin]    Pravastatin Other (See Comments)    Muscle Aches     Procedures This Admission:  1.  Tikosyn loading 2.  Direct current cardioversion on 12/06/22 by Dr Rennis Golden which successfully restored SR.  There were no early apparent complications.    Brief HPI: Lauren Hammond is a 89 y.o. female with a past medical history as noted above.  They were referred to EP in the outpatient setting for treatment options of atrial fibrillation.  Risks, benefits, and alternatives to Tikosyn were reviewed with the patient who wished to proceed.    Hospital Course:  The patient was admitted and Tikosyn was initiated.  Renal function and electrolytes were followed during the hospitalization.  The patient's QTc remained stable.  On 12/06/22 the patient underwent direct current cardioversion which restored sinus rhythm.  She was monitored until discharge on telemetry which demonstrated SR, 1st degree AVblock, infrequent blocked PACs.  On the day of discharge, she feels well, was examined by Dr  Lalla Brothers who considered the patient stable for discharge to home.  Follow-up has been arranged  with the AFib clinic in 1 week and with EP service in 4 weeks.   Tikosyn teaching was completed electrolyte replacement for home, Potassium daily  CalCrCL is typically worse about 40, today's Creat is likely an outlier for her, continue same Xarelto 15mg  dose  Physical Exam: Vitals:   12/06/22 1606 12/06/22 1945 12/07/22 0345 12/07/22 0832  BP: 129/79 119/75 135/67 137/74  Pulse: 72 76 67 74  Resp: 17 17 18 18   Temp: (!) 97.5 F (36.4 C) 97.6 F (36.4 C) (!) 97.4 F (36.3 C) 98.4 F (36.9 C)  TempSrc: Oral Oral Oral Oral  SpO2: 96% 96% 94% 96%  Weight:      Height:         GEN- The patient is well appearing, alert and oriented x 3 today.   HEENT: normocephalic, atraumatic; sclera clear, conjunctiva pink; hearing intact; oropharynx clear; neck supple, no JVP Lymph- no cervical lymphadenopathy Lungs- CTA b/l, normal work of breathing.  No wheezes, rales, rhonchi Heart- RRR, no murmurs, rubs or gallops, PMI not laterally displaced GI- soft, non-tender, non-distended Extremities- no clubbing, cyanosis, or edema MS- no significant deformity or atrophy Skin- warm and dry, no rash or lesion Psych- euthymic mood, full affect Neuro- strength and sensation are intact   Labs:   Lab Results  Component Value Date   WBC 5.4 04/18/2016   HGB 14.8 04/18/2016   HCT 44.3 04/18/2016   MCV 86.0 04/18/2016   PLT 163 04/18/2016    Recent Labs  Lab 12/07/22 0257  NA 137  K 3.6  CL 105  CO2 21*  BUN 24*  CREATININE 0.79  CALCIUM 8.9  GLUCOSE 92     Discharge Medications:  Allergies as of 12/07/2022       Reactions   Atorvastatin Other (See Comments)   Muscle Aches   Bupropion Other (See Comments)   Unspecified   Other Other (See Comments)   Unspecified, passed out Reaction: patient was unable to communicate and family member was unable to tell me any allergies    Percodan [oxycodone-aspirin]    Pravastatin Other (See Comments)   Muscle Aches         Medication List     TAKE these medications    amLODipine 5 MG tablet Commonly known as: NORVASC Take 1 tablet (5 mg total) by mouth daily.   dofetilide 125 MCG capsule Commonly known as: TIKOSYN Take 1 capsule (125 mcg total) by mouth 2 (two) times daily.   gabapentin 100 MG capsule Commonly known as: NEURONTIN Take 100 mg by mouth daily as needed (nerve pain).   HYDROcodone-acetaminophen 5-325 MG tablet Commonly known as: NORCO/VICODIN Take 1 tablet by mouth daily as needed for moderate pain.   levothyroxine 100 MCG tablet Commonly known as: SYNTHROID Take 100 mcg by mouth daily.   losartan 25 MG tablet Commonly known as: COZAAR Takes 1 tablet by mouth as needed only if blood pressure is up. What changed:  how much to take how to take this when to take this reasons to take this additional instructions   Multi-Vitamins Tabs Take 1 tablet by mouth daily.   potassium chloride SA 20 MEQ tablet Commonly known as: KLOR-CON M Take 1 tablet (20 mEq total) by mouth daily. Start taking on: December 08, 2022   Rivaroxaban 15 MG Tabs tablet Commonly known as: XARELTO Take 1 tablet (15 mg total) by mouth daily with supper.   sertraline 100 MG tablet Commonly known as: ZOLOFT Take 100 mg by mouth daily.   temazepam 15 MG capsule Commonly known as: RESTORIL Take 15 mg by mouth at bedtime as needed for sleep.   tetrahydrozoline 0.05 % ophthalmic solution Place 1 drop into both eyes as needed (redness).   VITAMIN D PO Take 1 tablet by mouth daily.        Disposition: Home Discharge Instructions     Diet - low sodium heart healthy   Complete by: As directed    Increase activity slowly   Complete by: As directed         Duration of Discharge Encounter: Greater than 30 minutes including physician time.  Norma Fredrickson, PA-C 12/07/2022 12:18 PM

## 2022-12-07 ENCOUNTER — Other Ambulatory Visit (HOSPITAL_COMMUNITY): Payer: Self-pay

## 2022-12-07 DIAGNOSIS — I4819 Other persistent atrial fibrillation: Secondary | ICD-10-CM | POA: Diagnosis not present

## 2022-12-07 LAB — BASIC METABOLIC PANEL
Anion gap: 11 (ref 5–15)
BUN: 24 mg/dL — ABNORMAL HIGH (ref 8–23)
CO2: 21 mmol/L — ABNORMAL LOW (ref 22–32)
Calcium: 8.9 mg/dL (ref 8.9–10.3)
Chloride: 105 mmol/L (ref 98–111)
Creatinine, Ser: 0.79 mg/dL (ref 0.44–1.00)
GFR, Estimated: >60 mL/min (ref >60)
Glucose, Bld: 92 mg/dL (ref 70–99)
Potassium: 3.6 mmol/L (ref 3.5–5.1)
Sodium: 137 mmol/L (ref 135–145)

## 2022-12-07 LAB — MAGNESIUM: Magnesium: 1.9 mg/dL (ref 1.7–2.4)

## 2022-12-07 MED ORDER — MAGNESIUM SULFATE 2 GM/50ML IV SOLN
2.0000 g | Freq: Once | INTRAVENOUS | Status: AC
  Administered 2022-12-07: 2 g via INTRAVENOUS
  Filled 2022-12-07: qty 50

## 2022-12-07 MED ORDER — POTASSIUM CHLORIDE CRYS ER 20 MEQ PO TBCR: 20.0000 meq | EXTENDED_RELEASE_TABLET | Freq: Every day | ORAL | Status: DC

## 2022-12-07 MED ORDER — DOFETILIDE 125 MCG PO CAPS
125.0000 ug | ORAL_CAPSULE | Freq: Two times a day (BID) | ORAL | 5 refills | Status: DC
  Filled 2022-12-07: qty 60, 30d supply, fill #0

## 2022-12-07 MED ORDER — POTASSIUM CHLORIDE CRYS ER 20 MEQ PO TBCR
60.0000 meq | EXTENDED_RELEASE_TABLET | ORAL | Status: AC
  Administered 2022-12-07: 60 meq via ORAL
  Filled 2022-12-07: qty 3

## 2022-12-07 MED ORDER — POTASSIUM CHLORIDE CRYS ER 20 MEQ PO TBCR
20.0000 meq | EXTENDED_RELEASE_TABLET | Freq: Every day | ORAL | 5 refills | Status: AC
  Filled 2022-12-07: qty 30, 30d supply, fill #0

## 2022-12-07 MED ORDER — ORAL CARE MOUTH RINSE: 15.0000 mL | OROMUCOSAL | Status: DC | PRN

## 2022-12-07 NOTE — Progress Notes (Signed)
Pharmacy: Dofetilide (Tikosyn) - Follow Up Assessment and Electrolyte Replacement  Pharmacy consulted to assist in monitoring and replacing electrolytes in this 89 y.o. female admitted on 12/04/2022 undergoing dofetilide initiation. First dofetilide dose: 1/9 pm  Labs:    Component Value Date/Time   K 3.6 12/07/2022 0257   MG 1.9 12/07/2022 0257     Plan: Potassium: K 3.5-3.7:  Give KCl 60 mEq po x1   Magnesium: Mg 1.8-2: Give Mg 2 gm IV x1    As patient has required on average x2 mEq of potassium replacement. Consider DC on potassium daily.      Leota Sauers Pharm.D. CPP, BCPS Clinical Pharmacist [phone] 12/07/2022 7:18 AM

## 2022-12-07 NOTE — Care Management Important Message (Signed)
Important Message  Patient Details  Name: Lauren Hammond MRN: [id number] Date of Birth: 1932/01/23   Medicare Important Message Given:  Yes     Renie Ora 12/07/2022, 9:26 AM

## 2022-12-08 ENCOUNTER — Encounter (HOSPITAL_COMMUNITY): Payer: Self-pay | Admitting: Internal Medicine

## 2022-12-10 ENCOUNTER — Other Ambulatory Visit: Payer: Self-pay | Admitting: Cardiovascular Disease

## 2022-12-10 NOTE — Telephone Encounter (Signed)
Prescription refill request for Xarelto received.  Indication:afib Last office visit:1/24 Weight:66  kg Age:89 Scr:0.7 CrCl:57.88  ml/min  Prescription refilled

## 2022-12-10 NOTE — Telephone Encounter (Signed)
Please review

## 2022-12-18 ENCOUNTER — Ambulatory Visit (HOSPITAL_COMMUNITY)
Admission: RE | Admit: 2022-12-18 | Discharge: 2022-12-18 | Disposition: A | Discharge status: Home / Self Care | Payer: Medicare Other | Source: Ambulatory Visit | Attending: Nurse Practitioner | Admitting: Nurse Practitioner

## 2022-12-18 ENCOUNTER — Inpatient Hospital Stay (HOSPITAL_COMMUNITY)
Admission: RE | Admit: 2022-12-18 | Discharge: 2022-12-18 | Disposition: A | Discharge status: Home / Self Care | Payer: Medicare Other | Source: Ambulatory Visit | Attending: Nurse Practitioner | Admitting: Nurse Practitioner

## 2022-12-18 ENCOUNTER — Encounter (HOSPITAL_COMMUNITY): Payer: Self-pay | Admitting: Nurse Practitioner

## 2022-12-18 ENCOUNTER — Other Ambulatory Visit: Payer: Self-pay

## 2022-12-18 VITALS — BP 136/80 | HR 84 | Ht 66.0 in | Wt 141.2 lb

## 2022-12-18 DIAGNOSIS — I4891 Unspecified atrial fibrillation: Secondary | ICD-10-CM

## 2022-12-18 DIAGNOSIS — D6869 Other thrombophilia: Secondary | ICD-10-CM

## 2022-12-18 DIAGNOSIS — I480 Paroxysmal atrial fibrillation: Secondary | ICD-10-CM

## 2022-12-18 DIAGNOSIS — Z8249 Family history of ischemic heart disease and other diseases of the circulatory system: Secondary | ICD-10-CM | POA: Diagnosis not present

## 2022-12-18 DIAGNOSIS — I4819 Other persistent atrial fibrillation: Secondary | ICD-10-CM | POA: Diagnosis not present

## 2022-12-18 DIAGNOSIS — Z7901 Long term (current) use of anticoagulants: Secondary | ICD-10-CM | POA: Diagnosis not present

## 2022-12-18 LAB — BASIC METABOLIC PANEL
Anion gap: 10 (ref 5–15)
BUN: 24 mg/dL — ABNORMAL HIGH (ref 8–23)
CO2: 28 mmol/L (ref 22–32)
Calcium: 10 mg/dL (ref 8.9–10.3)
Chloride: 99 mmol/L (ref 98–111)
Creatinine, Ser: 0.83 mg/dL (ref 0.44–1.00)
GFR, Estimated: >60 mL/min (ref >60)
Glucose, Bld: 87 mg/dL (ref 70–99)
Potassium: 4.8 mmol/L (ref 3.5–5.1)
Sodium: 137 mmol/L (ref 135–145)

## 2022-12-18 LAB — MAGNESIUM: Magnesium: 2.3 mg/dL (ref 1.7–2.4)

## 2022-12-18 MED ORDER — DOFETILIDE 125 MCG PO CAPS
125.0000 ug | ORAL_CAPSULE | Freq: Two times a day (BID) | ORAL | 3 refills | Status: DC
  Filled 2022-12-18: qty 60, 30d supply, fill #0

## 2022-12-18 NOTE — Progress Notes (Signed)
Primary Care Physician: Barbette Reichmann, MD Referring Physician: Dr. Sandria Senter is a 89 y.o. female with a h/o new onset afib in the fall of 2023. She  had a cardioversion and saw improvement afterward but unfortunately had ERAF. She is now here for Tikosyn prep, recommendation of Dr. Lalla Brothers after consult on 12/6. Amiodarone thought to not be a good choice with previous lung cancer.   She states no missed anticoagulation. No benadryl use. EKg shows afib at 74 bpm with a qt of 439 ms.  F/u in afib clinic, 12/18/22. Unfortunately, she is in afib at 84 bpm. She felt well for a few days after discharge and then felt she went out of rhythm. She then had one day she felt well but was not surprised to hear she was out of rhythm today.   Today, she denies symptoms of palpitations, chest pain, shortness of breath, orthopnea, PND, lower extremity edema, dizziness, presyncope, syncope, or neurologic sequela. The patient is tolerating medications without difficulties and is otherwise without complaint today.   Past Medical History:  Diagnosis Date   Barrett esophagus    Depression    Generalized OA    HTN (hypertension)    Hyperlipemia    Hypothyroid    Insomnia    Lung cancer (HCC) 11/2015   treated with radiation   Personal history of radiation therapy 2017   lung Ca   Uterine cancer (HCC) 1981   uterus ca with a hysterectomy   Past Surgical History:  Procedure Laterality Date   ABDOMINAL HYSTERECTOMY  1984   [place], [place]   APPENDECTOMY     BREAST BIOPSY Right 1980's   neg   CARDIOVERSION N/A 09/20/2022   Procedure: CARDIOVERSION;  Surgeon: Antonieta Iba, MD;  Location: [facility] ORS;  Service: Cardiovascular;  Laterality: N/A;   CARDIOVERSION N/A 12/06/2022   Procedure: CARDIOVERSION;  Surgeon: Chrystie Nose, MD;  [facility];  Service: Cardiovascular;  Laterality: N/A;   KNEE ARTHROSCOPY Right 2000   [facility]   right breast bx       Current Outpatient Medications  Medication Sig Dispense Refill   amLODipine (NORVASC) 5 MG tablet Take 1 tablet (5 mg total) by mouth daily. 90 tablet 3   dofetilide (TIKOSYN) 125 MCG capsule Take 1 capsule (125 mcg total) by mouth 2 (two) times daily. 60 capsule 5   gabapentin (NEURONTIN) 100 MG capsule Take 100 mg by mouth daily as needed (nerve pain).     HYDROcodone-acetaminophen (NORCO/VICODIN) 5-325 MG tablet Take 1 tablet by mouth daily as needed for moderate pain.      levothyroxine (SYNTHROID) 100 MCG tablet Take 100 mcg by mouth daily.     losartan (COZAAR) 25 MG tablet Takes 1 tablet by mouth as needed only if blood pressure is up. (Patient taking differently: Take 25 mg by mouth daily as needed (If blood pressure is up).)     Multiple Vitamin (MULTI-VITAMINS) TABS Take 1 tablet by mouth daily.     potassium chloride SA (KLOR-CON M) 20 MEQ tablet Take 1 tablet (20 mEq total) by mouth daily. 30 tablet 5   sertraline (ZOLOFT) 100 MG tablet Take 100 mg by mouth daily.     temazepam (RESTORIL) 15 MG capsule Take 15 mg by mouth at bedtime as needed for sleep.      tetrahydrozoline 0.05 % ophthalmic solution Place 1 drop into both eyes as needed (redness).     VITAMIN D PO  Take 1 tablet by mouth daily.     XARELTO 15 MG TABS tablet TAKE 1 TABLET BY MOUTH DAILY  WITH SUPPER 100 tablet 2   No current facility-administered medications for this encounter.    Allergies  Allergen Reactions   Atorvastatin Other (See Comments)    Muscle Aches   Bupropion Other (See Comments)    Unspecified   Other Other (See Comments)    Unspecified, passed out Reaction: patient was unable to communicate and family member was unable to tell me any allergies    Percodan [Oxycodone-Aspirin]    Pravastatin Other (See Comments)    Muscle Aches    Social History   Socioeconomic History   Marital status: Widowed    Spouse name: Not on file   Number of children: Not on file   Years of education:  Not on file   Highest education level: Not on file  Occupational History   Not on file  Tobacco Use   Smoking status: Never   Smokeless tobacco: Never  Vaping Use   Vaping Use: Never used  Substance and Sexual Activity   Alcohol use: No    Alcohol/week: 0.0 standard drinks of alcohol   Drug use: No   Sexual activity: Not on file  Other Topics Concern   Not on file  Social History Narrative   Lives alone.    Social Determinants of Health   Financial Resource Strain: Not on file  Food Insecurity: No Food Insecurity (12/04/2022)   Hunger Vital Sign    Worried About Running Out of Food in the Last Year: Never true    Ran Out of Food in the Last Year: Never true  Transportation Needs: No Transportation Needs (12/04/2022)   PRAPARE - Administrator, Civil Service (Medical): No    Lack of Transportation (Non-Medical): No  Physical Activity: Not on file  Stress: Not on file  Social Connections: Not on file  Intimate Partner Violence: Not At Risk (12/04/2022)   Humiliation, Afraid, Rape, and Kick questionnaire    Fear of Current or Ex-Partner: No    Emotionally Abused: No    Physically Abused: No    Sexually Abused: No    Family History  Problem Relation Age of Onset   Hypertension Other    Breast cancer Maternal Aunt    Breast cancer Sister 50    ROS- All systems are reviewed and negative except as per the HPI above  Physical Exam: Vitals:   12/18/22 0955  BP: 136/80  Pulse: 84  Weight: 64 kg  Height: 5\' 6"  (1.676 m)   Wt Readings from Last 3 Encounters:  12/18/22 64 kg  12/04/22 66 kg  12/04/22 65 kg    Labs: Lab Results  Component Value Date   NA 137 12/07/2022   K 3.6 12/07/2022   CL 105 12/07/2022   CO2 21 (L) 12/07/2022   GLUCOSE 92 12/07/2022   BUN 24 (H) 12/07/2022   CREATININE 0.79 12/07/2022   CALCIUM 8.9 12/07/2022   MG 1.9 12/07/2022   Lab Results  Component Value Date   INR 1.02 04/18/2016   No results found for: "CHOL", "HDL",  "LDLCALC", "TRIG"   GEN- The patient is well appearing, alert and oriented x 3 today.   Head- normocephalic, atraumatic Eyes-  Sclera clear, conjunctiva pink Ears- hearing intact Oropharynx- clear Neck- supple, no JVP Lymph- no cervical lymphadenopathy Lungs- Clear to ausculation bilaterally, normal work of breathing Heart- irregular rate and rhythm, no  murmurs, rubs or gallops, PMI not laterally displaced GI- soft, NT, ND, + BS Extremities- no clubbing, cyanosis, or edema MS- no significant deformity or atrophy Skin- no rash or lesion Psych- euthymic mood, full affect Neuro- strength and sensation are intact  EKG- Vent. rate 84 BPM PR interval * ms QRS duration 84 ms QT/QTcB 382/451 ms P-R-T axes * -24 65 Atrial fibrillation Septal infarct , age undetermined Abnormal ECG When compared with ECG of 07-Dec-2022 10:58, PREVIOUS ECG IS PRESENT    Assessment and Plan:  1. Persistent Afib  One week since tikosyn discharge and she is out of rhythm She may be paroxysmal  If persistent, discussed with pt that usually we pursue cardioversion before it is determined that tikosyn has been ineffective, she is not excited about doing that at this time  One week zio patch  No benadryl use Qtc acceptable today  Bmet/mag are pending   2. CHA2DS2VASc  score of at least 5 Continue xarelto 15 mg daily  F/u with Dr. Lalla Brothers 01/09/23  [facility] Sidle. [facility] [address] [place], [place] [address] [phone]

## 2022-12-19 ENCOUNTER — Other Ambulatory Visit: Payer: Self-pay

## 2022-12-21 ENCOUNTER — Telehealth: Payer: Self-pay | Admitting: Cardiovascular Disease

## 2022-12-21 NOTE — Telephone Encounter (Signed)
Returned the call to the patient's daughter, per the dpr. She stated that the patient woke up this morning with swelling in both hands, ankles and feet. She did say the swelling was minor. The patient does not weigh daily. She gets Meals on Wheels and does not add salt to her food. She denies shortness of breath.  The daughter stated that the patient was on a diuretic previously but this was stopped due to blood pressure and dizziness. She wants to know if she should go back on a diuretic. This is the first time the patient has had swelling in a few years.  She has been advised to have her mother elevate her legs and monitor the swelling. Message routed to primary cardiologist for further recommendations.

## 2022-12-21 NOTE — Telephone Encounter (Signed)
Pt c/o swelling: STAT is pt has developed SOB within 24 hours  How much weight have you gained and in what time span?  Unsure, does not keep track of weights  If swelling, where is the swelling located?  Hands and legs   Are you currently taking a fluid pill?  No, but patient's daughter is interested in having patient take Torsemide 100 MG  Are you currently SOB?    Do you have a log of your daily weights (if so, list)?  No   Have you gained 3 pounds in a day or 5 pounds in a week?    Have you traveled recently?  No

## 2022-12-24 NOTE — Telephone Encounter (Signed)
Left a message for the patient's daughter  to call back.  Minna Merritts, MD  [facility] Triage2 days ago    Can we call to follow-up on her swelling symptoms Difficult to determine what to do, I have not seen her in approximately 2 years She could take Lasix 20 with potassium 20 daily as needed, would not take this more than twice a week for swelling to minimize risk of kidney issues Meals on Wheels likely has high salt in food Thx TGollan

## 2022-12-24 NOTE — Telephone Encounter (Signed)
Daughter was returning the call. Please advise

## 2022-12-25 MED ORDER — FUROSEMIDE 20 MG PO TABS: 20.0000 mg | ORAL_TABLET | ORAL | 3 refills | Status: AC | PRN

## 2022-12-25 NOTE — Telephone Encounter (Signed)
Daughter returned RN's call.

## 2022-12-25 NOTE — Telephone Encounter (Signed)
Spoke to pt and daughter. Pt stated she continues to have swelling in feet, legs, and hand. Pt made aware of MD's recommendations and verbalized understanding. Nurse also recommended pt keep follow up appointment scheduled with Dr. Quentin Ore on 2/14.  Lasix 20 mg PRN sent to requested pharmacy. Pt verbalized understanding the MD recommends to take no more than twice a week.  Pt reports she is already taking Potassium 20 meq daily.

## 2022-12-27 ENCOUNTER — Other Ambulatory Visit: Payer: Self-pay

## 2023-01-03 ENCOUNTER — Encounter (HOSPITAL_COMMUNITY): Payer: Self-pay | Admitting: *Deleted

## 2023-01-07 ENCOUNTER — Ambulatory Visit: Payer: Medicare Other | Admitting: Cardiovascular Disease

## 2023-01-08 NOTE — Progress Notes (Unsigned)
Electrophysiology Office Follow up Visit Note:    Date:  01/09/2023   ID:  [name], DOB 16-Dec-1931, MRN [id number]  PCP:  Tracie Harrier, MD  [facility] Cardiologist:  Ida Rogue, MD  [facility] Electrophysiologist:  Vickie Epley, MD    Interval History:    Lauren Hammond is a 89 y.o. female who presents for a follow up visit.  Seen by Butch Penny 12/18/2022 for pers AF. She was back in AF at that appointment despite treatment with dofetilide. A heart monitor was prescribed to confirm persistence of AF. The heart monitor showed 100% AF with an average rate of 74 bpm.  She is with her daughter today in clinic.  She has not noticed any fast heartbeats.  She reports chronic fatigue.  Poor sleep quality.  Intermittent swelling.  She does take Lasix intermittently but does not notice any increased urination after taking a dose.  She continues to take Xarelto for stroke prophylaxis without any bleeding issues.      Past Medical History:  Diagnosis Date   Barrett esophagus    Depression    Generalized OA    HTN (hypertension)    Hyperlipemia    Hypothyroid    Insomnia    Lung cancer ([place]) 11/2015   treated with radiation   Personal history of radiation therapy 2017   lung Ca   Uterine cancer ([place]) 1981   uterus ca with a hysterectomy    Past Surgical History:  Procedure Laterality Date   ABDOMINAL HYSTERECTOMY  1984   [place], [place]   APPENDECTOMY     BREAST BIOPSY Right 1980's   neg   CARDIOVERSION N/A 09/20/2022   Procedure: CARDIOVERSION;  Surgeon: Minna Merritts, MD;  Location: [facility] ORS;  Service: Cardiovascular;  Laterality: N/A;   CARDIOVERSION N/A 12/06/2022   Procedure: CARDIOVERSION;  Surgeon: Pixie Casino, MD;  [facility];  Service: Cardiovascular;  Laterality: N/A;   KNEE ARTHROSCOPY Right 2000   [place]   right breast bx      Current Medications: Current Meds  Medication Sig   amLODipine (NORVASC) 5 MG  tablet Take 1 tablet (5 mg total) by mouth daily.   furosemide (LASIX) 20 MG tablet Take 1 tablet (20 mg total) by mouth as needed (swelling).   gabapentin (NEURONTIN) 100 MG capsule Take 100 mg by mouth daily as needed (nerve pain).   HYDROcodone-acetaminophen (NORCO/VICODIN) 5-325 MG tablet Take 1 tablet by mouth daily as needed for moderate pain.    levothyroxine (SYNTHROID) 100 MCG tablet Take 100 mcg by mouth daily.   losartan (COZAAR) 25 MG tablet Takes 1 tablet by mouth as needed only if blood pressure is up. (Patient taking differently: Take 25 mg by mouth daily as needed (If blood pressure is up).)   Multiple Vitamin (MULTI-VITAMINS) TABS Take 1 tablet by mouth daily.   potassium chloride SA (KLOR-CON M) 20 MEQ tablet Take 1 tablet (20 mEq total) by mouth daily.   sertraline (ZOLOFT) 100 MG tablet Take 100 mg by mouth daily.   temazepam (RESTORIL) 15 MG capsule Take 15 mg by mouth at bedtime as needed for sleep.    tetrahydrozoline 0.05 % ophthalmic solution Place 1 drop into both eyes as needed (redness).   VITAMIN D PO Take 1 tablet by mouth daily.   XARELTO 15 MG TABS tablet TAKE 1 TABLET BY MOUTH DAILY  WITH SUPPER   [DISCONTINUED] dofetilide (TIKOSYN) 125 MCG capsule Take 1 capsule (125 mcg  total) by mouth 2 (two) times daily.     Allergies:   Atorvastatin, Bupropion, Other, Percodan [oxycodone-aspirin], and Pravastatin   Social History   Socioeconomic History   Marital status: Widowed    Spouse name: Not on file   Number of children: Not on file   Years of education: Not on file   Highest education level: Not on file  Occupational History   Not on file  Tobacco Use   Smoking status: Never   Smokeless tobacco: Never  Vaping Use   Vaping Use: Never used  Substance and Sexual Activity   Alcohol use: No    Alcohol/week: 0.0 standard drinks of alcohol   Drug use: No   Sexual activity: Not on file  Other Topics Concern   Not on file  Social History Narrative   Lives  alone.    Social Determinants of Health   Financial Resource Strain: Not on file  Food Insecurity: No Food Insecurity (12/04/2022)   Hunger Vital Sign    Worried About Running Out of Food in the Last Year: Never true    Ran Out of Food in the Last Year: Never true  Transportation Needs: No Transportation Needs (12/04/2022)   PRAPARE - Hydrologist (Medical): No    Lack of Transportation (Non-Medical): No  Physical Activity: Not on file  Stress: Not on file  Social Connections: Not on file     Family History: The patient's family history includes Breast cancer in her maternal aunt; Breast cancer (age of onset: 107) in her sister; Hypertension in an other family member.  ROS:   Please see the history of present illness.    All other systems reviewed and are negative.  EKGs/Labs/Other Studies Reviewed:    The following studies were reviewed today:  01/03/2023 Zio 100% AF, average rate 74 bpm   Recent Labs: 12/18/2022: BUN 24; Creatinine, Ser 0.83; Magnesium 2.3; Potassium 4.8; Sodium 137  Recent Lipid Panel No results found for: "CHOL", "TRIG", "HDL", "CHOLHDL", "VLDL", "LDLCALC", "LDLDIRECT"  Physical Exam:    VS:  Ht 5\' 6"  (1.676 m)   Wt 143 lb (64.9 kg)   SpO2 96%   BMI 23.08 kg/m     Wt Readings from Last 3 Encounters:  01/09/23 143 lb (64.9 kg)  12/18/22 141 lb 3.2 oz (64 kg)  12/04/22 145 lb 8.1 oz (66 kg)     GEN: Elderly, frail, no distress CARDIAC: Irregularly irregular, no murmurs, rubs, gallops Extremities: Trivial edema, warm extremities      ASSESSMENT:    1. Permanent atrial fibrillation ([place])   2. Essential hypertension   3. Chronic diastolic heart failure (HCC)    PLAN:    In order of problems listed above:   #Permanent AF Has failed dofetilide/DCCV Not a good candidate for amiodarone given history of lung disease. I would consider her AF permanent at this point. Continue xarelto. Recommend rate  control.  #Chronic diastolic hF NYHA II. Warm and very slightly volume overloaded on exam Cont lasix but increase dose to 40 mg by mouth as needed for weight gain of 3 pounds within 24 hours.  I have asked her to take a dose tomorrow morning and then check her weight the following day to establish baseline "dry weight".  She will follow-up with Dr. Rockey Situ regarding further titration of her diastolic heart failure regimen.  #Hypertension At goal today.  Recommend checking blood pressures 1-2 times per week at home and recording the  values.  Recommend bringing these recordings to the primary care physician.   Follow up with Dr Rockey Situ. Follow up with EP as needed.    Medication Adjustments/Labs and Tests Ordered: Current medicines are reviewed at length with the patient today.  Concerns regarding medicines are outlined above.  No orders of the defined types were placed in this encounter.  No orders of the defined types were placed in this encounter.    Signed, Lars Mage, MD, [facility], [facility] 01/09/2023 11:37 AM    Electrophysiology [facility]

## 2023-01-09 ENCOUNTER — Encounter: Payer: Self-pay | Admitting: Cardiology

## 2023-01-09 ENCOUNTER — Ambulatory Visit: Payer: Medicare Other | Attending: Cardiology | Admitting: Cardiology

## 2023-01-09 VITALS — Ht 66.0 in | Wt 143.0 lb

## 2023-01-09 DIAGNOSIS — I10 Essential (primary) hypertension: Secondary | ICD-10-CM | POA: Diagnosis not present

## 2023-01-09 DIAGNOSIS — I4821 Permanent atrial fibrillation: Secondary | ICD-10-CM | POA: Diagnosis not present

## 2023-01-09 DIAGNOSIS — I5032 Chronic diastolic (congestive) heart failure: Secondary | ICD-10-CM | POA: Diagnosis not present

## 2023-01-09 NOTE — Patient Instructions (Signed)
Medication Instructions:  Your physician has recommended you make the following change in your medication:  1) STOP taking Tikosyn  *If you need a refill on your cardiac medications before your next appointment, please call your pharmacy*  Follow-Up: At [facility], you and your health needs are our priority.  As part of our continuing mission to provide you with exceptional heart care, we have created designated Provider Care Teams.  These Care Teams include your primary Cardiologist (physician) and Advanced Practice Providers (APPs -  Physician Assistants and Nurse Practitioners) who all work together to provide you with the care you need, when you need it.  Your next appointment:   As needed with Dr. Quentin Ore

## 2023-01-22 ENCOUNTER — Other Ambulatory Visit (HOSPITAL_COMMUNITY): Payer: Self-pay

## 2023-02-11 ENCOUNTER — Telehealth: Payer: Self-pay | Admitting: *Deleted

## 2023-02-11 NOTE — Telephone Encounter (Signed)
Nurse placed call to patient to review appointment details for upcoming new hematology consultation visit.  Patient confirmed appointment time and details. Patient denies any questions or concerns.

## 2023-02-12 ENCOUNTER — Encounter: Payer: Self-pay | Admitting: Internal Medicine

## 2023-02-12 ENCOUNTER — Inpatient Hospital Stay: Payer: Medicare Other | Attending: Internal Medicine | Admitting: Internal Medicine

## 2023-02-12 ENCOUNTER — Inpatient Hospital Stay: Payer: Medicare Other

## 2023-02-12 VITALS — BP 111/70 | HR 68 | Temp 96.7°F | Resp 18 | Wt 145.0 lb

## 2023-02-12 DIAGNOSIS — C3412 Malignant neoplasm of upper lobe, left bronchus or lung: Secondary | ICD-10-CM | POA: Insufficient documentation

## 2023-02-12 DIAGNOSIS — Z90710 Acquired absence of both cervix and uterus: Secondary | ICD-10-CM | POA: Diagnosis not present

## 2023-02-12 DIAGNOSIS — Z923 Personal history of irradiation: Secondary | ICD-10-CM | POA: Diagnosis not present

## 2023-02-12 DIAGNOSIS — D751 Secondary polycythemia: Secondary | ICD-10-CM | POA: Diagnosis not present

## 2023-02-12 DIAGNOSIS — Z7901 Long term (current) use of anticoagulants: Secondary | ICD-10-CM | POA: Insufficient documentation

## 2023-02-12 DIAGNOSIS — Z803 Family history of malignant neoplasm of breast: Secondary | ICD-10-CM | POA: Diagnosis not present

## 2023-02-12 DIAGNOSIS — I10 Essential (primary) hypertension: Secondary | ICD-10-CM | POA: Diagnosis not present

## 2023-02-12 DIAGNOSIS — I4891 Unspecified atrial fibrillation: Secondary | ICD-10-CM

## 2023-02-12 DIAGNOSIS — I4821 Permanent atrial fibrillation: Secondary | ICD-10-CM | POA: Insufficient documentation

## 2023-02-12 DIAGNOSIS — Z01812 Encounter for preprocedural laboratory examination: Secondary | ICD-10-CM

## 2023-02-12 LAB — CBC WITH DIFFERENTIAL/PLATELET
Abs Immature Granulocytes: 0.03 10*3/uL (ref 0.00–0.07)
Basophils Absolute: 0.1 10*3/uL (ref 0.0–0.1)
Basophils Relative: 1 %
Eosinophils Absolute: 0.1 10*3/uL (ref 0.0–0.5)
Eosinophils Relative: 2 %
HCT: 48 % — ABNORMAL HIGH (ref 36.0–46.0)
Hemoglobin: 15.7 g/dL — ABNORMAL HIGH (ref 12.0–15.0)
Immature Granulocytes: 0 %
Lymphocytes Relative: 29 %
Lymphs Abs: 2.2 10*3/uL (ref 0.7–4.0)
MCH: 29.7 pg (ref 26.0–34.0)
MCHC: 32.7 g/dL (ref 30.0–36.0)
MCV: 90.9 fL (ref 80.0–100.0)
Monocytes Absolute: 0.8 10*3/uL (ref 0.1–1.0)
Monocytes Relative: 11 %
Neutro Abs: 4.4 10*3/uL (ref 1.7–7.7)
Neutrophils Relative %: 57 %
Platelets: 190 10*3/uL (ref 150–400)
RBC: 5.28 MIL/uL — ABNORMAL HIGH (ref 3.87–5.11)
RDW: 13.6 % (ref 11.5–15.5)
WBC: 7.6 10*3/uL (ref 4.0–10.5)
nRBC: 0 % (ref 0.0–0.2)

## 2023-02-12 LAB — BASIC METABOLIC PANEL
Anion gap: 8 (ref 5–15)
BUN: 23 mg/dL (ref 8–23)
CO2: 26 mmol/L (ref 22–32)
Calcium: 9.5 mg/dL (ref 8.9–10.3)
Chloride: 102 mmol/L (ref 98–111)
Creatinine, Ser: 0.96 mg/dL (ref 0.44–1.00)
GFR, Estimated: 57 mL/min — ABNORMAL LOW (ref >60)
Glucose, Bld: 89 mg/dL (ref 70–99)
Potassium: 4.6 mmol/L (ref 3.5–5.1)
Sodium: 136 mmol/L (ref 135–145)

## 2023-02-12 NOTE — Progress Notes (Signed)
Kidney function remains stable and electrolytes are stable. No current changes needed to medication regimen.

## 2023-02-12 NOTE — Progress Notes (Signed)
[place]  [phone] [phone]) [phone]  ID: Lauren Hammond OB: [birth year]  MR#: [id number]  [id number]  Patient Care Team: Tracie Harrier, MD as PCP - General (Internal Medicine) Minna Merritts, MD as PCP - Cardiology (Cardiology) Vickie Epley, MD as PCP - Electrophysiology (Cardiology)  REFERRING PROVIDER: Dr. Ginette Pitman  REASON FOR REFERRAL: polycythemia  HPI: Lauren Hammond is a 89 y.o. female with past medical history of depression, hypertension, hyperlipidemia, hypothyroidism, left upper lobe lung cancer status post SBRT in 2017, endometrial cancer status post hysterectomy in 1984, permanent A-fib on Xarelto failed cardioversion was referred to hematology for workup of polycythemia.  Patient was seen today accompanied by daughter.  She reports fatigue for about 6 months.  Gets shortness of breath on exertion.  Occasional dizziness.  No headaches.  Has macular degeneration.  Denies any pruritus, weight loss, changes in appetite, night sweats.  Labs from 01/30/2023 showed WBC 7.3, hemoglobin 17, hematocrit 50.6 and platelets of 218. Oct 2023 Hg/Htc 16.5/50.28 April 2022 15.9/49.04 January 2022 16.1/49.2 Prior to that hemoglobin was normal.   REVIEW OF SYSTEMS:   ROS  As per HPI. Otherwise, a complete review of systems is negative.  PAST MEDICAL HISTORY: Past Medical History:  Diagnosis Date   Barrett esophagus    Depression    Generalized OA    HTN (hypertension)    Hyperlipemia    Hypothyroid    Insomnia    Lung cancer ([place]) 11/2015   treated with radiation   Personal history of radiation therapy 2017   lung Ca   Uterine cancer ([place]) 1981   uterus ca with a hysterectomy    PAST SURGICAL HISTORY: Past Surgical History:  Procedure Laterality Date   ABDOMINAL HYSTERECTOMY  1984   [place], [place]   APPENDECTOMY     BREAST BIOPSY Right 1980's   neg   CARDIOVERSION N/A 09/20/2022   Procedure: CARDIOVERSION;  Surgeon:  Minna Merritts, MD;  Location: [facility] ORS;  Service: Cardiovascular;  Laterality: N/A;   CARDIOVERSION N/A 12/06/2022   Procedure: CARDIOVERSION;  Surgeon: Pixie Casino, MD;  Location: [facility];  Service: Cardiovascular;  Laterality: N/A;   KNEE ARTHROSCOPY Right 2000   [place]   right breast bx      FAMILY HISTORY: Family History  Problem Relation Age of Onset   Hypertension Other    Breast cancer Maternal Aunt    Breast cancer Sister 48    HEALTH MAINTENANCE: Social History   Tobacco Use   Smoking status: Never   Smokeless tobacco: Never  Vaping Use   Vaping Use: Never used  Substance Use Topics   Alcohol use: No    Alcohol/week: 0.0 standard drinks of alcohol   Drug use: No     Allergies  Allergen Reactions   Atorvastatin Other (See Comments)    Muscle Aches   Bupropion Other (See Comments)    Unspecified   Other Other (See Comments)    Unspecified, passed out Reaction: patient was unable to communicate and family member was unable to tell me any allergies    Percodan [Oxycodone-Aspirin]    Pravastatin Other (See Comments)    Muscle Aches    Current Outpatient Medications  Medication Sig Dispense Refill   amLODipine (NORVASC) 5 MG tablet Take 1 tablet (5 mg total) by mouth daily. 90 tablet 3   furosemide (LASIX) 20 MG tablet Take 1 tablet (20 mg total) by mouth as needed (swelling). 30 tablet 3  gabapentin (NEURONTIN) 100 MG capsule Take 100 mg by mouth daily as needed (nerve pain).     HYDROcodone-acetaminophen (NORCO/VICODIN) 5-325 MG tablet Take 1 tablet by mouth daily as needed for moderate pain.      levothyroxine (SYNTHROID) 100 MCG tablet Take 100 mcg by mouth daily.     losartan (COZAAR) 25 MG tablet Takes 1 tablet by mouth as needed only if blood pressure is up. (Patient taking differently: Take 25 mg by mouth daily as needed (If blood pressure is up).)     Multiple Vitamin (MULTI-VITAMINS) TABS Take 1 tablet by mouth daily.      potassium chloride SA (KLOR-CON M) 20 MEQ tablet Take 1 tablet (20 mEq total) by mouth daily. 30 tablet 5   sertraline (ZOLOFT) 100 MG tablet Take 100 mg by mouth daily.     temazepam (RESTORIL) 15 MG capsule Take 15 mg by mouth at bedtime as needed for sleep.      tetrahydrozoline 0.05 % ophthalmic solution Place 1 drop into both eyes as needed (redness).     VITAMIN D PO Take 1 tablet by mouth daily.     XARELTO 15 MG TABS tablet TAKE 1 TABLET BY MOUTH DAILY  WITH SUPPER 100 tablet 2   No current facility-administered medications for this visit.    OBJECTIVE: There were no vitals filed for this visit.   There is no height or weight on file to calculate BMI.      General: Well-developed, well-nourished, no acute distress. Eyes: Pink conjunctiva, anicteric sclera. HEENT: Normocephalic, moist mucous membranes, clear oropharnyx. Lungs: Clear to auscultation bilaterally. Heart: Regular rate and rhythm. No rubs, murmurs, or gallops. Abdomen: Soft, nontender, nondistended. No organomegaly noted, normoactive bowel sounds. Musculoskeletal: No edema, cyanosis, or clubbing. Neuro: Alert, answering all questions appropriately. Cranial nerves grossly intact. Skin: No rashes or petechiae noted. Psych: Normal affect. Lymphatics: No cervical, calvicular, axillary or inguinal LAD.   LAB RESULTS:  Lab Results  Component Value Date   NA 137 12/18/2022   K 4.8 12/18/2022   CL 99 12/18/2022   CO2 28 12/18/2022   GLUCOSE 87 12/18/2022   BUN 24 (H) 12/18/2022   CREATININE 0.83 12/18/2022   CALCIUM 10.0 12/18/2022   PROT 7.9 12/01/2015   ALBUMIN 4.2 12/01/2015   AST 19 12/01/2015   ALT 13 (L) 12/01/2015   ALKPHOS 52 12/01/2015   BILITOT 0.5 12/01/2015   GFRNONAA >60 12/18/2022   GFRAA >60 04/18/2016    Lab Results  Component Value Date   WBC 5.4 04/18/2016   NEUTROABS 2.8 04/18/2016   HGB 14.8 04/18/2016   HCT 44.3 04/18/2016   MCV 86.0 04/18/2016   PLT 163 04/18/2016    No  results found for: "TIBC", "FERRITIN", "IRONPCTSAT"   STUDIES: No results found.  ASSESSMENT AND PLAN:   Lauren Hammond is a 89 y.o. female with pmh of depression, hypertension, hyperlipidemia, hypothyroidism, left upper lobe lung cancer status post SBRT in 2017, endometrial cancer status post hysterectomy in 1984, permanent A-fib on Xarelto failed cardioversion was referred to hematology for workup of polycythemia.  # Polycythemia -Unknown etiology present since February 2023.  Hemoglobin ranging between 16-17. -I will obtain labs as below to rule out myeloproliferative neoplasm versus secondary polycythemia. On CT chest from 2021, there is presence of central lobar emphysema which can cause secondary polycythemia.  Denies any smoking.  Medication list reviewed and no offending agents identified.  Patient and daughter had questions about potential treatment.  I discussed  that if we find any abnormal mutation then we will consider doing phlebotomies.  Otherwise if it is related to secondary causes then there is no specific target hematocrit for the patient's.  Cautious phlebotomy may be considered for symptom relief.  And reduction of hematocrit less than 55% is not usually advisable because it can exacerbate dyspnea or other hypoxic symptoms.  Currently she has occasional dizziness which I think is unlikely due to her polycythemia.  I will follow-up with the patient again in 2 weeks to discuss the lab results.  #LUL lung adenocarcinoma Stage 1A -Status post SBRT by Dr. Baruch Gouty in 2017  # Permanent atrial fibrillation -Follows with cardiology.  Has failed Dofelitide and cardioversion.  On Xarelto.   Orders Placed This Encounter  Procedures   CBC with Differential/Platelet   JAK2 V617F rfx CALR/MPL/E12-15   Erythropoietin   RTC in 2 weeks MyChart video visit to discuss labs.  Patient expressed understanding and was in agreement with this plan. She also understands that She can call clinic  at any time with any questions, concerns, or complaints.   I spent a total of 45 minutes reviewing chart data, face-to-face evaluation with the patient, counseling and coordination of care as detailed above.  Jane Canary, MD   02/12/2023 11:19 AM

## 2023-02-13 LAB — ERYTHROPOIETIN: Erythropoietin: 16.9 m[IU]/mL (ref 2.6–18.5)

## 2023-02-22 LAB — JAK2 V617F RFX CALR/MPL/E12-15

## 2023-02-22 LAB — CALR +MPL + E12-E15  (REFLEX)

## 2023-02-26 ENCOUNTER — Inpatient Hospital Stay: Payer: Medicare Other | Attending: Internal Medicine | Admitting: Internal Medicine

## 2023-02-26 DIAGNOSIS — J432 Centrilobular emphysema: Secondary | ICD-10-CM | POA: Diagnosis not present

## 2023-02-26 DIAGNOSIS — D751 Secondary polycythemia: Secondary | ICD-10-CM | POA: Diagnosis not present

## 2023-02-28 NOTE — Progress Notes (Signed)
[place]  [phone] [phone]  I connected with Lauren Hammond on 02/28/23 at  3:30 PM EDT by my chart video and verified that I am speaking with the correct person using two identifiers.   I discussed the limitations, risks, security and privacy concerns of performing an evaluation and management service by telemedicine and the availability of in-person appointments. I also discussed with the patient that there may be a patient responsible charge related to this service. The patient expressed understanding and agreed to proceed.   Other persons participating in the visit and their role in the encounter: daughter    Patient's location: home  Provider's location: clinic   Chief Complaint: discuss labs   ID: Lauren Hammond OB: 06-22-1932  MR#: [id number]  [id number]  Patient Care Team: Tracie Harrier, MD as PCP - General (Internal Medicine) Minna Merritts, MD as PCP - Cardiology (Cardiology) Vickie Epley, MD as PCP - Electrophysiology (Cardiology)  HPI: Lauren Hammond is a 89 y.o. female with past medical history of depression, hypertension, hyperlipidemia, hypothyroidism, left upper lobe lung cancer status post SBRT in 2017, endometrial cancer status post hysterectomy in 1984, permanent A-fib on Xarelto failed cardioversion was referred to hematology for workup of polycythemia.  Patient was seen today accompanied by daughter.  She reports fatigue for about 6 months.  Gets shortness of breath on exertion.  Occasional dizziness.  No headaches.  Has macular degeneration.  Denies any pruritus, weight loss, changes in appetite, night sweats.  Labs from 01/30/2023 showed WBC 7.3, hemoglobin 17, hematocrit 50.6 and platelets of 218. Oct 2023 Hg/Htc 16.5/50.28 April 2022 15.9/49.04 January 2022 16.1/49.2 Prior to that hemoglobin was normal.  Interval history I connected with the patient and the daughter via MyChart video visit to  discuss labs. Patient denies any new symptoms since I last saw her.  REVIEW OF SYSTEMS:   ROS  As per HPI. Otherwise, a complete review of systems is negative.  PAST MEDICAL HISTORY: Past Medical History:  Diagnosis Date   Barrett esophagus    Depression    Generalized OA    HTN (hypertension)    Hyperlipemia    Hypothyroid    Insomnia    Lung cancer ([place]) 11/2015   treated with radiation   Personal history of radiation therapy 2017   lung Ca   Uterine cancer ([place]) 1981   uterus ca with a hysterectomy    PAST SURGICAL HISTORY: Past Surgical History:  Procedure Laterality Date   ABDOMINAL HYSTERECTOMY  1984   [place], [place]   APPENDECTOMY     BREAST BIOPSY Right 1980's   neg   CARDIOVERSION N/A 09/20/2022   Procedure: CARDIOVERSION;  Surgeon: Minna Merritts, MD;  Location: [facility] OR[facility];  Service: Cardiovascular;  Laterality: N/A;   CARDIOVERSION N/A 12/06/2022   Procedure: CARDIOVERSION;  Surgeon: Pixie Casino, MD;  Location: [facility];  Service: Cardiovascular;  Laterality: N/A;   KNEE ARTHROSCOPY Right 2000   [place]   right breast bx      FAMILY HISTORY: Family History  Problem Relation Age of Onset   Hypertension Other    Breast cancer Maternal Aunt    Breast cancer Sister 42    HEALTH MAINTENANCE: Social History   Tobacco Use   Smoking status: Never   Smokeless tobacco: Never  Vaping Use   Vaping Use: Never used  Substance Use Topics   Alcohol use: No    Alcohol/week: 0.0 standard drinks of  alcohol   Drug use: No     Allergies  Allergen Reactions   Atorvastatin Other (See Comments)    Muscle Aches   Bupropion Other (See Comments)    Unspecified   Other Other (See Comments)    Unspecified, passed out Reaction: patient was unable to communicate and family member was unable to tell me any allergies    Percodan [Oxycodone-Aspirin]    Pravastatin Other (See Comments)    Muscle Aches    Current Outpatient Medications   Medication Sig Dispense Refill   amLODipine (NORVASC) 5 MG tablet Take 1 tablet (5 mg total) by mouth daily. 90 tablet 3   furosemide (LASIX) 20 MG tablet Take 1 tablet (20 mg total) by mouth as needed (swelling). 30 tablet 3   gabapentin (NEURONTIN) 100 MG capsule Take 100 mg by mouth daily as needed (nerve pain).     HYDROcodone-acetaminophen (NORCO/VICODIN) 5-325 MG tablet Take 1 tablet by mouth daily as needed for moderate pain.      levothyroxine (SYNTHROID) 100 MCG tablet Take 100 mcg by mouth daily.     losartan (COZAAR) 25 MG tablet Takes 1 tablet by mouth as needed only if blood pressure is up. (Patient taking differently: Take 25 mg by mouth daily as needed (If blood pressure is up).)     mirtazapine (REMERON) 7.5 MG tablet Take 7.5 mg by mouth.     Multiple Vitamin (MULTI-VITAMINS) TABS Take 1 tablet by mouth daily.     potassium chloride SA (KLOR-CON M) 20 MEQ tablet Take 1 tablet (20 mEq total) by mouth daily. 30 tablet 5   sertraline (ZOLOFT) 100 MG tablet Take 100 mg by mouth daily.     tetrahydrozoline 0.05 % ophthalmic solution Place 1 drop into both eyes as needed (redness).     VITAMIN D PO Take 1 tablet by mouth daily.     XARELTO 15 MG TABS tablet TAKE 1 TABLET BY MOUTH DAILY  WITH SUPPER 100 tablet 2   temazepam (RESTORIL) 15 MG capsule Take 15 mg by mouth at bedtime as needed for sleep.  (Patient not taking: Reported on 02/26/2023)     No current facility-administered medications for this visit.    OBJECTIVE: There were no vitals filed for this visit.   There is no height or weight on file to calculate BMI.      Physical exam not performed-video visit   LAB RESULTS:  Lab Results  Component Value Date   NA 136 02/12/2023   K 4.6 02/12/2023   CL 102 02/12/2023   CO2 26 02/12/2023   GLUCOSE 89 02/12/2023   BUN 23 02/12/2023   CREATININE 0.96 02/12/2023   CALCIUM 9.5 02/12/2023   PROT 7.9 12/01/2015   ALBUMIN 4.2 12/01/2015   AST 19 12/01/2015   ALT 13 (L)  12/01/2015   ALKPHOS 52 12/01/2015   BILITOT 0.5 12/01/2015   GFRNONAA 57 (L) 02/12/2023   GFRAA >60 04/18/2016    Lab Results  Component Value Date   WBC 7.6 02/12/2023   NEUTROABS 4.4 02/12/2023   HGB 15.7 (H) 02/12/2023   HCT 48.0 (H) 02/12/2023   MCV 90.9 02/12/2023   PLT 190 02/12/2023    No results found for: "TIBC", "FERRITIN", "IRONPCTSAT"   STUDIES: No results found.  ASSESSMENT AND PLAN:   Lauren Hammond is a 89 y.o. female with pmh of depression, hypertension, hyperlipidemia, hypothyroidism, left upper lobe lung cancer status post SBRT in 2017, endometrial cancer status post hysterectomy in 1984,  permanent A-fib on Xarelto failed cardioversion was referred to hematology for workup of polycythemia.  # Polycythemia -Present since February 2023.  Hemoglobin ranging between 15.5-17.  -I repeated CBC which showed hemoglobin of 15.7 and hematocrit of 48.  Jak V617F/exon 20/MPL/CALR negative.  EPO level is normal.  Her mutational testing is negative and EPO is normal rules out myeloproliferative neoplasm.  I am suspecting her mild polycythemia could be secondary from emphysema.  On CT chest from 2021 there is evidence of centrilobular emphysema.  There is no indication for phlebotomy or any cytoreductive therapy with secondary polycythemia at this time.  She has occasional dizziness which is chronic.  Patient will continue to follow-up with her primary Dr. Ginette Pitman for hemoglobin monitoring every 6 to 12 months.  She will see me as needed.  She can be referred back to me if her hemoglobin continues to trend above 17.  #LUL lung adenocarcinoma Stage 1A -Status post SBRT by Dr. Baruch Gouty in 2017  # Permanent atrial fibrillation -Follows with cardiology.  Has failed Dofelitide and cardioversion.  On Xarelto.  Patient expressed understanding and was in agreement with this plan. She also understands that She can call clinic at any time with any questions, concerns, or complaints.    I spent a total of 30 minutes reviewing chart data, face-to-face evaluation with the patient, counseling and coordination of care as detailed above.  Jane Canary, MD   02/28/2023 11:33 AM

## 2023-04-23 ENCOUNTER — Telehealth: Payer: Self-pay | Admitting: Cardiovascular Disease

## 2023-04-23 NOTE — Telephone Encounter (Signed)
Spoke with patient's daughter and informed her of the following:  * Hypotension is the medical term for low blood pressure. Hypotension is generally only of medical concern if it causes symptoms like dizziness, fainting, or shock. * Blood pressure in the range of 90/60 to 120/80 is normal. * Blood pressure in the range of 80/50 to 90/50 can sometimes be normal. For example, there are some healthy individuals who normally have a blood pressure in the range of 85/50 to 95/60. * It is unusual for healthy individuals to have a blood pressure less than 80/50. * A fall in blood pressure of more than 20 mmHg can be significant. For example, a drop in blood pressure from 130/75 to 95/60 may be a sign of significant dehydration in a person with acute vomiting or diarrhea. * Automatic home BP measurement devices can sometimes be unreliable. Have patient check BP in both arms. If there is another adult in the home, consider checking their BP to see if the device is functioning correctly.  BLOOD PRESSURE CLASSIFICATION IN ADULTS * Very low: < 80/50 * Low: < 90/60 * Normal: 90-120/60-79 * Prehypertension: 120-139/80-89 * Hypertension: Stage 1: 140-159/90-99 * Hypertension: Stage 2: >=160/100  ORTHOSTATIC HYPOTENSION * Definition: A fall in systolic BP of > 20 mm Hg or fall in diastolic BP of > 10 mm Hg within 3 minutes of assuming an upright position. * Prevalence: The prevalence of orthostatic hypotension increases with age: adults (7%), people over 29 (20%), people over 75 (30%), nursing home population (50%). * Symptoms: Dizziness, lightheadedness, or weakness upon standing or sitting up; fainting. * Causes: Hypovolemia from dehydration or bleeding, medications, age-related vascular changes. * Treatment: The treatment depends on the cause: stop bleeding, replace fluids, adjust medicines, sit or stand up slowly.  POSTPRANDIAL HYPOTENSION * Definition: A fall in systolic BP of > 20 mm Hg within 2 hours  of eating a meal. This mainly occurs in the frail elderly and can cause fainting and falls. * Prevalence: 24-36% of nursing home patients experience postprandial hypotension. * Treatment: Eating smaller more frequent meals may help. Adequate hydration is important, as dehydration can aggravate this problem. Anecdotal reports suggest that caffeine may be helpful in reducing postprandial hypotension.   Patient scheduled for appointment Wednesday 05/15/23 @ 2:45PM

## 2023-04-23 NOTE — Telephone Encounter (Signed)
Pt c/o BP issue: STAT if pt c/o blurred vision, one-sided weakness or slurred speech  1. What are your last 5 BP readings? 100/62 - This morning  2. Are you having any other symptoms (ex. Dizziness, headache, blurred vision, passed out)? Dizziness and SOB  3. What is your BP issue? Daughter states that pt's BP was low when Home Health nurse came today. HR and oxygen are normal pt also experienced what she described as a "pinch" under her left breast yesterday. Daughter would like a callback regarding this matter. Please advise

## 2023-05-01 ENCOUNTER — Other Ambulatory Visit: Payer: Self-pay | Admitting: Internal Medicine

## 2023-05-01 DIAGNOSIS — Z1231 Encounter for screening mammogram for malignant neoplasm of breast: Secondary | ICD-10-CM

## 2023-05-15 ENCOUNTER — Encounter: Payer: Self-pay | Admitting: Nurse Practitioner

## 2023-05-15 ENCOUNTER — Ambulatory Visit: Payer: Medicare Other | Attending: Nurse Practitioner | Admitting: Nurse Practitioner

## 2023-05-15 ENCOUNTER — Ambulatory Visit
Admission: RE | Admit: 2023-05-15 | Discharge: 2023-05-15 | Disposition: A | Discharge status: Home / Self Care | Payer: Medicare Other | Source: Ambulatory Visit | Attending: Internal Medicine | Admitting: Internal Medicine

## 2023-05-15 VITALS — BP 100/62 | HR 71 | Ht 65.0 in | Wt 143.1 lb

## 2023-05-15 DIAGNOSIS — I4821 Permanent atrial fibrillation: Secondary | ICD-10-CM

## 2023-05-15 DIAGNOSIS — I5032 Chronic diastolic (congestive) heart failure: Secondary | ICD-10-CM

## 2023-05-15 DIAGNOSIS — Z1231 Encounter for screening mammogram for malignant neoplasm of breast: Secondary | ICD-10-CM | POA: Diagnosis present

## 2023-05-15 DIAGNOSIS — R42 Dizziness and giddiness: Secondary | ICD-10-CM

## 2023-05-15 DIAGNOSIS — I10 Essential (primary) hypertension: Secondary | ICD-10-CM | POA: Diagnosis not present

## 2023-05-15 MED ORDER — AMLODIPINE BESYLATE 2.5 MG PO TABS: 2.5000 mg | ORAL_TABLET | Freq: Every day | ORAL | 6 refills | Status: DC

## 2023-05-15 NOTE — Progress Notes (Signed)
Office Visit    Patient Name: Lauren Hammond Date of Encounter: 05/15/2023  Primary Care Provider:  Barbette Reichmann, MD Primary Cardiologist:  Lauren Nordmann, MD  Chief Complaint    89 y.o. y/o female with a history of permanent atrial fibrillation, chronic HFpEF, hyperlipidemia, hypertension, hypothyroidism, chronic chest and left arm pain, uterine cancer status post hysterectomy, and lung cancer status post radiation, who presents for follow-up of atrial fibrillation and HFpEF.  Past Medical History    Past Medical History:  Diagnosis Date   Barrett esophagus    Chronic heart failure with preserved ejection fraction (HFpEF) (HCC)    a. 11/2015 Echo: EF 50-55%; b. 05/2019 Echo: EF 60-65%; c. 07/2022 Echo: EF 60-65%, no rwma, mild asymm LVH of basal-septal wall, nl RV fxn, RVSP 38.57mmHg. Mildly dil LA. Mild MR. Mild-mod TR.   Depression    Generalized OA    HTN (hypertension)    Hyperlipemia    Hypothyroid    Insomnia    Lung cancer (HCC) 11/2015   treated with radiation   Permanent atrial fibrillation (HCC)    a. Dx 06/2022-->DCCV; b. 11/2022 recurrent Afib-->tikosyn-->DCCV; c. 11/2022 ERAF-->Zio 100% AF burden; d. CHA2DS2VASc = 5-->xarelto.   Personal history of radiation therapy 2017   lung Ca   Uterine cancer (HCC) 1981   uterus ca with a hysterectomy   Past Surgical History:  Procedure Laterality Date   ABDOMINAL HYSTERECTOMY  1984   [place], [place]   APPENDECTOMY     BREAST BIOPSY Right 1980's   neg   CARDIOVERSION N/A 09/20/2022   Procedure: CARDIOVERSION;  Surgeon: Antonieta Iba, MD;  Location: [facility] ORS;  Service: Cardiovascular;  Laterality: N/A;   CARDIOVERSION N/A 12/06/2022   Procedure: CARDIOVERSION;  Surgeon: Chrystie Nose, MD;  Location: [facility];  Service: Cardiovascular;  Laterality: N/A;   KNEE ARTHROSCOPY Right 2000   [facility]   right breast bx      Allergies  Allergies  Allergen Reactions   Atorvastatin Other (See  Comments)    Muscle Aches   Bupropion Other (See Comments)    Unspecified   Other Other (See Comments)    Unspecified, passed out Reaction: patient was unable to communicate and family member was unable to tell me any allergies    Percodan [Oxycodone-Aspirin]    Pravastatin Other (See Comments)    Muscle Aches    History of Present Illness      89 y.o. y/o female with a history of permanent atrial fibrillation, chronic HFpEF, hyperlipidemia, hypertension, hypothyroidism, chronic chest and left arm pain, uterine cancer status post hysterectomy, and lung cancer status post radiation.  In August 2023, she was evaluated with complaints of fatigue, dyspnea, lower extremity edema.  She was found to be in atrial fibrillation.  She was placed on Xarelto therapy.  Subsequent echo showed normal LV and RV function with RVSP of 38.6 mmHg, mild MR, mild-moderate TR.  She subsequently went cardioversion October 2023, which was initially successful however, she had early return of atrial fibrillation and was referred to A-fib clinic in [place].  In January 2024, she was admitted for Tikosyn loading and subsequently underwent cardioversion while hospitalized.  At office follow-up in late January, she was noted to be back in atrial fibrillation.  A ZIO monitor was placed which showed 100% A-fib burden.  She remained in atrial fibrillation at EP follow-up in February 2024 and was deemed to have a permanent atrial fibrillation, which was well rate-controlled.  Tikosyn therapy was discontinued.   On May 14 of this year, patient was seen at the [facility] emergency department after falling in her yard and striking her head 4 days prior.  CT of the head and neck showed no evidence of hemorrhage or acute cervical spine fracture.  She was discharged home with as needed Tylenol.  In late May, patient's daughter contacted our office due to low blood pressures with systolics in the 80s and 90s, and follow-up was  arranged for today.  [name] also noted left lateral chest discomfort that was worse w/ upper body mvmts and position changes, and relieved by lying still or using a heating pad.  Chest pain symptoms have since subsided.  She has continued to note occasional low blood pressure associated with lightheadedness.  Today, she notes chronic fatigue.  This was present prior to her A-fib diagnosis and is unchanged.  She denies dyspnea, palpitations, PND, orthopnea, syncope, or early satiety.  Roughly every other day, she has mild ankle swelling and will use Lasix 20 mg with good relief.  She does wear compression socks at night noting that otherwise, her legs feel tired in the morning.  She has never tried using compression during the day.  Home Medications    Current Outpatient Medications  Medication Sig Dispense Refill   amLODipine (NORVASC) 5 MG tablet Take 1 tablet (5 mg total) by mouth daily. 90 tablet 3   furosemide (LASIX) 20 MG tablet Take 1 tablet (20 mg total) by mouth as needed (swelling). 30 tablet 3   gabapentin (NEURONTIN) 100 MG capsule Take 100 mg by mouth daily as needed (nerve pain).     HYDROcodone-acetaminophen (NORCO/VICODIN) 5-325 MG tablet Take 1 tablet by mouth daily as needed for moderate pain.      levothyroxine (SYNTHROID) 100 MCG tablet Take 100 mcg by mouth daily.     losartan (COZAAR) 25 MG tablet Takes 1 tablet by mouth as needed only if blood pressure is up. (Patient taking differently: Take 25 mg by mouth daily as needed (If blood pressure is up).)     Multiple Vitamin (MULTI-VITAMINS) TABS Take 1 tablet by mouth daily.     potassium chloride SA (KLOR-CON M) 20 MEQ tablet Take 1 tablet (20 mEq total) by mouth daily. 30 tablet 5   sertraline (ZOLOFT) 100 MG tablet Take 100 mg by mouth daily.     temazepam (RESTORIL) 15 MG capsule Take 15 mg by mouth at bedtime as needed for sleep.     tetrahydrozoline 0.05 % ophthalmic solution Place 1 drop into both eyes as needed  (redness).     VITAMIN D PO Take 1 tablet by mouth daily.     XARELTO 15 MG TABS tablet TAKE 1 TABLET BY MOUTH DAILY  WITH SUPPER 100 tablet 2   mirtazapine (REMERON) 7.5 MG tablet Take 7.5 mg by mouth. (Patient not taking: Reported on 05/15/2023)     No current facility-administered medications for this visit.     Review of Systems    Chronic fatigue.  Occasional ankle swelling.  Some orthostatic lightheadedness.  Had some musculoskeletal chest pain in May.  She denies dyspnea, palpitations, PND, orthopnea, syncope, or early satiety.  All other systems reviewed and are otherwise negative except as noted above.    Physical Exam    VS:  BP 100/62 (BP Location: Left Arm, Patient Position: Sitting, Cuff Size: Normal)   Pulse 71   Ht 5\' 5"  (1.651 m)   Wt 143 lb  2 oz (64.9 kg)   SpO2 94%   BMI 23.82 kg/m  , BMI Body mass index is 23.82 kg/m.    Orthostatic VS for the past 24 hrs:  BP- Lying Pulse- Lying BP- Sitting Pulse- Sitting BP- Standing at 0 minutes Pulse- Standing at 0 minutes  05/15/23 1419 144/84 61 124/75 84 130/73 85     GEN: Well nourished, well developed, in no acute distress. HEENT: normal. Neck: Supple, no JVD, carotid bruits, or masses. Cardiac: IR, IR, no murmurs, rubs, or gallops. No clubbing, cyanosis, edema.  Radials 2+/PT 2+ and equal bilaterally.  Respiratory:  Respirations regular and unlabored, clear to auscultation bilaterally. GI: Soft, nontender, nondistended, BS + x 4. MS: no deformity or atrophy. Skin: warm and dry, no rash. Neuro:  Strength and sensation are intact. Psych: Normal affect.  Accessory Clinical Findings    ECG personally reviewed by me today - afib, LAD, LAFB, septal infarct - no acute changes.  Lab Results  Component Value Date   WBC 7.6 02/12/2023   HGB 15.7 (H) 02/12/2023   HCT 48.0 (H) 02/12/2023   MCV 90.9 02/12/2023   PLT 190 02/12/2023   Lab Results  Component Value Date   CREATININE 0.96 02/12/2023   BUN 23 02/12/2023    NA 136 02/12/2023   K 4.6 02/12/2023   CL 102 02/12/2023   CO2 26 02/12/2023   Lab Results  Component Value Date   ALT 13 (L) 12/01/2015   AST 19 12/01/2015   ALKPHOS 52 12/01/2015   BILITOT 0.5 12/01/2015    Assessment & Plan    1.  Permanent atrial fibrillation: Patient diagnosed with atrial fibrillation in the summer of 2023 with subsequent cardioversion and ERAF, followed by Tikosyn loading and repeat cardioversion but unfortunately, had ERAF in late January and has since been managed as permanent, rate controlled A-fib.  She has chronic fatigue, which was present prior to diagnosis of atrial fibrillation, and has been unchanged.  She does not experience palpitations or dyspnea.  She has had some orthostasis and I am adjusting her amlodipine as outlined below.  She is not currently on any AV nodal blocking agent.  She is anticoagulated with dose adjusted Xarelto in the setting of creatinine clearance of 29.3.  Long discussion with patient and daughter today regarding management of atrial fibrillation, stroke risk, and role of anticoagulation.  Reassurance provided in the setting of being relatively asymptomatic.  2.  Chronic HFpEF: EF 60-65% by echo in September 2023 with RVSP 38.6 mmHg.  She has chronic fatigue but does not typically experience dyspnea on exertion.  She has Lasix to be used on an as-needed basis only, and takes 20 mg roughly every other day for mild ankle or foot swelling.  She wears compression socks at night and we discussed the utilization during the day may negate the need for Lasix at all.  Further, in the setting of orthostasis, I am going to reduce her amlodipine dose which may help to alleviate ankle edema as well.  We discussed the importance of daily weights, sodium restriction, medication compliance, and symptom reporting and she verbalizes understanding.   3.  Orthostatic lightheadedness/essential hypertension: Late last month, patient was noticing low blood  pressures with associated lightheadedness.  Orthostatic vital signs today, she does drop her blood pressure 20 points when moving from lying to sitting with a rise in heart rate by 23 bpm.  Initial pressure was soft 100/62.  I am going to reduce her amlodipine  to 2.5 mg daily.  We did discuss discontinuing it altogether however, she prefers a more stepwise approach.  As above, she is also using Lasix 20 mg every other day and if we can better address her lower extremity swelling with compression, we could potentially negate the need for Lasix going forward.  She does have losartan on her list, but this was previously prescribed as needed only for markedly elevated systolic pressures.  She has not required this in some time.  4.  Disposition: Patient will contact us regarding her blood pressures.  Otherwise, she prefers to follow-up closely with primary care and follow-up here again for limited basis.  Will arrange for follow-up in approximately 6 months.    Nicolasa Ducking, NP 05/15/2023, 2:39 PM

## 2023-05-15 NOTE — Patient Instructions (Signed)
Medication Instructions:  Decrease Taking: Amlodipine (Norvasc) 2.5 mg daily  *If you need a refill on your cardiac medications before your next appointment, please call your pharmacy*   Lab Work: none If you have labs (blood work) drawn today and your tests are completely normal, you will receive your results only by: MyChart Message (if you have MyChart) OR A paper copy in the mail If you have any lab test that is abnormal or we need to change your treatment, we will call you to review the results.   Testing/Procedures: none   Follow-Up: At [facility], you and your health needs are our priority.  As part of our continuing mission to provide you with exceptional heart care, we have created designated Provider Care Teams.  These Care Teams include your primary Cardiologist (physician) and Advanced Practice Providers (APPs -  Physician Assistants and Nurse Practitioners) who all work together to provide you with the care you need, when you need it.  We recommend signing up for the patient portal called "MyChart".  Sign up information is provided on this After Visit Summary.  MyChart is used to connect with patients for Virtual Visits (Telemedicine).  Patients are able to view lab/test results, encounter notes, upcoming appointments, etc.  Non-urgent messages can be sent to your provider as well.   To learn more about what you can do with MyChart, go to [email or web address].    Your next appointment:   6 month(s)  Provider:   Julien Nordmann, MD

## 2023-07-31 ENCOUNTER — Other Ambulatory Visit (HOSPITAL_COMMUNITY): Payer: Self-pay

## 2023-08-12 ENCOUNTER — Other Ambulatory Visit (HOSPITAL_COMMUNITY): Payer: Self-pay

## 2023-08-20 ENCOUNTER — Other Ambulatory Visit (HOSPITAL_COMMUNITY): Payer: Self-pay

## 2023-08-21 ENCOUNTER — Other Ambulatory Visit: Payer: Self-pay | Admitting: Nurse Practitioner

## 2023-08-26 ENCOUNTER — Other Ambulatory Visit: Payer: Self-pay | Admitting: Cardiovascular Disease

## 2023-08-26 NOTE — Telephone Encounter (Signed)
Prescription refill request for Xarelto received.  Indication:afib Last office visit:6/24 Weight:64.9  kg Age:89 Scr:0.81  9/24 CrCl:48.24  ml/min  Prescription refilled

## 2023-09-20 ENCOUNTER — Other Ambulatory Visit: Payer: Self-pay

## 2023-09-20 ENCOUNTER — Telehealth: Payer: Self-pay | Admitting: Cardiovascular Disease

## 2023-09-20 MED ORDER — RIVAROXABAN 15 MG PO TABS: 15.0000 mg | ORAL_TABLET | Freq: Every day | ORAL | 0 refills | Status: DC

## 2023-09-20 NOTE — Telephone Encounter (Signed)
Pt c/o medication issue:  1. Name of Medication: XARELTO 15 MG TABS tablet   2. How are you currently taking this medication (dosage and times per day)? As directed  3. Are you having a reaction (difficulty breathing--STAT)? no  4. What is your medication issue? Spoke to patients daughter, she states that her mother needs a refill on this medication but is in the donut hole. Last time this happened she had to pay $400 out of pocket. Daughter wants to know if there is a way that she can get a generic brand or if it is possible to get samples until January. Please advise.

## 2023-09-20 NOTE — Telephone Encounter (Signed)
Called patient daughter, advised that we had 2 bottles of Xarelto to give to her for now, she could check back in a few weeks if we received any more samples.   Patient daughter verbalized understanding. They will be by next week to pick up samples.    Medication Samples have been provided to the patient.  Drug name: Xarelto       Strength: 15 mg        Qty: 14 tablets  LOT: [id number]  Exp.Date: 12/2023  Dosing instructions: take 1 tablet by mouth daily  The patient has been instructed regarding the correct time, dose, and frequency of taking this medication, including desired effects and most common side effects.   Cydney Ok 1:46 PM 09/20/2023

## 2023-10-29 ENCOUNTER — Telehealth: Payer: Self-pay | Admitting: Cardiovascular Disease

## 2023-10-29 NOTE — Telephone Encounter (Signed)
Patient calling the office for samples of medication:   1.  What medication and dosage are you requesting samples for?  XARELTO 15 MG TABS tablet   2.  Are you currently out of this medication?   Yes.  Patient stated she is in the donut hole and wants some samples to tied her over.

## 2023-10-29 NOTE — Telephone Encounter (Signed)
Spoke with patient and reviewed information with xarelto. Provided her with number to [facility] for Xarelto $89.00 for 30 day supply. She took number down and will reach out to them. No further needs.

## 2023-11-15 ENCOUNTER — Encounter: Payer: Self-pay | Admitting: Cardiovascular Disease

## 2023-11-15 ENCOUNTER — Ambulatory Visit: Payer: Medicare Other | Attending: Cardiovascular Disease | Admitting: Cardiovascular Disease

## 2023-11-15 VITALS — BP 124/70 | HR 69 | Ht 66.0 in | Wt 144.0 lb

## 2023-11-15 DIAGNOSIS — I2510 Atherosclerotic heart disease of native coronary artery without angina pectoris: Secondary | ICD-10-CM

## 2023-11-15 DIAGNOSIS — I4821 Permanent atrial fibrillation: Secondary | ICD-10-CM

## 2023-11-15 DIAGNOSIS — I10 Essential (primary) hypertension: Secondary | ICD-10-CM | POA: Diagnosis not present

## 2023-11-15 DIAGNOSIS — R42 Dizziness and giddiness: Secondary | ICD-10-CM

## 2023-11-15 DIAGNOSIS — E782 Mixed hyperlipidemia: Secondary | ICD-10-CM

## 2023-11-15 DIAGNOSIS — I5032 Chronic diastolic (congestive) heart failure: Secondary | ICD-10-CM

## 2023-11-15 NOTE — Patient Instructions (Signed)
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
## 2024-02-11 ENCOUNTER — Inpatient Hospital Stay

## 2024-02-11 ENCOUNTER — Ambulatory Visit: Admitting: Urology

## 2024-02-11 ENCOUNTER — Encounter: Payer: Self-pay | Admitting: Emergency Medicine

## 2024-02-11 ENCOUNTER — Emergency Department

## 2024-02-11 ENCOUNTER — Inpatient Hospital Stay
Admission: EM | Admit: 2024-02-11 | Discharge: 2024-02-16 | DRG: 698 | Disposition: A | Discharge status: Home Health | Attending: Internal Medicine | Admitting: Internal Medicine

## 2024-02-11 ENCOUNTER — Other Ambulatory Visit: Payer: Self-pay

## 2024-02-11 DIAGNOSIS — B961 Klebsiella pneumoniae [K. pneumoniae] as the cause of diseases classified elsewhere: Secondary | ICD-10-CM | POA: Diagnosis present

## 2024-02-11 DIAGNOSIS — S300XXA Contusion of lower back and pelvis, initial encounter: Secondary | ICD-10-CM | POA: Diagnosis present

## 2024-02-11 DIAGNOSIS — I110 Hypertensive heart disease with heart failure: Secondary | ICD-10-CM | POA: Diagnosis present

## 2024-02-11 DIAGNOSIS — W19XXXA Unspecified fall, initial encounter: Secondary | ICD-10-CM | POA: Diagnosis present

## 2024-02-11 DIAGNOSIS — Z79890 Hormone replacement therapy (postmenopausal): Secondary | ICD-10-CM | POA: Diagnosis not present

## 2024-02-11 DIAGNOSIS — Z66 Do not resuscitate: Secondary | ICD-10-CM | POA: Diagnosis present

## 2024-02-11 DIAGNOSIS — K2270 Barrett's esophagus without dysplasia: Secondary | ICD-10-CM | POA: Diagnosis present

## 2024-02-11 DIAGNOSIS — S52502S Unspecified fracture of the lower end of left radius, sequela: Secondary | ICD-10-CM | POA: Diagnosis not present

## 2024-02-11 DIAGNOSIS — A419 Sepsis, unspecified organism: Secondary | ICD-10-CM

## 2024-02-11 DIAGNOSIS — S32592A Other specified fracture of left pubis, initial encounter for closed fracture: Secondary | ICD-10-CM | POA: Diagnosis present

## 2024-02-11 DIAGNOSIS — A4159 Other Gram-negative sepsis: Secondary | ICD-10-CM | POA: Diagnosis present

## 2024-02-11 DIAGNOSIS — Z923 Personal history of irradiation: Secondary | ICD-10-CM | POA: Diagnosis not present

## 2024-02-11 DIAGNOSIS — E039 Hypothyroidism, unspecified: Secondary | ICD-10-CM | POA: Diagnosis present

## 2024-02-11 DIAGNOSIS — I5032 Chronic diastolic (congestive) heart failure: Secondary | ICD-10-CM | POA: Diagnosis present

## 2024-02-11 DIAGNOSIS — T83511A Infection and inflammatory reaction due to indwelling urethral catheter, initial encounter: Principal | ICD-10-CM

## 2024-02-11 DIAGNOSIS — S52502A Unspecified fracture of the lower end of left radius, initial encounter for closed fracture: Secondary | ICD-10-CM | POA: Diagnosis present

## 2024-02-11 DIAGNOSIS — S329XXS Fracture of unspecified parts of lumbosacral spine and pelvis, sequela: Secondary | ICD-10-CM | POA: Diagnosis not present

## 2024-02-11 DIAGNOSIS — F32A Depression, unspecified: Secondary | ICD-10-CM | POA: Diagnosis present

## 2024-02-11 DIAGNOSIS — I480 Paroxysmal atrial fibrillation: Secondary | ICD-10-CM | POA: Diagnosis present

## 2024-02-11 DIAGNOSIS — E785 Hyperlipidemia, unspecified: Secondary | ICD-10-CM | POA: Diagnosis present

## 2024-02-11 DIAGNOSIS — Z8249 Family history of ischemic heart disease and other diseases of the circulatory system: Secondary | ICD-10-CM | POA: Diagnosis not present

## 2024-02-11 DIAGNOSIS — I4821 Permanent atrial fibrillation: Secondary | ICD-10-CM | POA: Diagnosis present

## 2024-02-11 DIAGNOSIS — Z1152 Encounter for screening for COVID-19: Secondary | ICD-10-CM | POA: Diagnosis not present

## 2024-02-11 DIAGNOSIS — Y738 Miscellaneous gastroenterology and urology devices associated with adverse incidents, not elsewhere classified: Secondary | ICD-10-CM | POA: Diagnosis present

## 2024-02-11 DIAGNOSIS — Z885 Allergy status to narcotic agent status: Secondary | ICD-10-CM

## 2024-02-11 DIAGNOSIS — Z9889 Other specified postprocedural states: Secondary | ICD-10-CM

## 2024-02-11 DIAGNOSIS — R339 Retention of urine, unspecified: Secondary | ICD-10-CM

## 2024-02-11 DIAGNOSIS — Z7901 Long term (current) use of anticoagulants: Secondary | ICD-10-CM

## 2024-02-11 DIAGNOSIS — Z9049 Acquired absence of other specified parts of digestive tract: Secondary | ICD-10-CM

## 2024-02-11 DIAGNOSIS — N390 Urinary tract infection, site not specified: Secondary | ICD-10-CM | POA: Diagnosis present

## 2024-02-11 DIAGNOSIS — J449 Chronic obstructive pulmonary disease, unspecified: Secondary | ICD-10-CM

## 2024-02-11 DIAGNOSIS — E876 Hypokalemia: Secondary | ICD-10-CM | POA: Diagnosis not present

## 2024-02-11 DIAGNOSIS — Z79899 Other long term (current) drug therapy: Secondary | ICD-10-CM

## 2024-02-11 DIAGNOSIS — I4891 Unspecified atrial fibrillation: Secondary | ICD-10-CM | POA: Diagnosis not present

## 2024-02-11 DIAGNOSIS — Z888 Allergy status to other drugs, medicaments and biological substances status: Secondary | ICD-10-CM

## 2024-02-11 DIAGNOSIS — Z90710 Acquired absence of both cervix and uterus: Secondary | ICD-10-CM

## 2024-02-11 DIAGNOSIS — G4700 Insomnia, unspecified: Secondary | ICD-10-CM | POA: Diagnosis present

## 2024-02-11 DIAGNOSIS — Z1611 Resistance to penicillins: Secondary | ICD-10-CM | POA: Diagnosis present

## 2024-02-11 DIAGNOSIS — Y846 Urinary catheterization as the cause of abnormal reaction of the patient, or of later complication, without mention of misadventure at the time of the procedure: Secondary | ICD-10-CM | POA: Diagnosis present

## 2024-02-11 DIAGNOSIS — A4151 Sepsis due to Escherichia coli [E. coli]: Secondary | ICD-10-CM | POA: Diagnosis present

## 2024-02-11 DIAGNOSIS — Z9181 History of falling: Secondary | ICD-10-CM | POA: Diagnosis not present

## 2024-02-11 DIAGNOSIS — M159 Polyosteoarthritis, unspecified: Secondary | ICD-10-CM | POA: Diagnosis present

## 2024-02-11 DIAGNOSIS — W19XXXD Unspecified fall, subsequent encounter: Secondary | ICD-10-CM | POA: Diagnosis present

## 2024-02-11 DIAGNOSIS — R5381 Other malaise: Secondary | ICD-10-CM | POA: Diagnosis present

## 2024-02-11 DIAGNOSIS — S32592D Other specified fracture of left pubis, subsequent encounter for fracture with routine healing: Secondary | ICD-10-CM

## 2024-02-11 DIAGNOSIS — Z85118 Personal history of other malignant neoplasm of bronchus and lung: Secondary | ICD-10-CM

## 2024-02-11 DIAGNOSIS — Z803 Family history of malignant neoplasm of breast: Secondary | ICD-10-CM

## 2024-02-11 DIAGNOSIS — S52502D Unspecified fracture of the lower end of left radius, subsequent encounter for closed fracture with routine healing: Secondary | ICD-10-CM

## 2024-02-11 DIAGNOSIS — Z8542 Personal history of malignant neoplasm of other parts of uterus: Secondary | ICD-10-CM

## 2024-02-11 DIAGNOSIS — R7881 Bacteremia: Secondary | ICD-10-CM | POA: Diagnosis not present

## 2024-02-11 DIAGNOSIS — S300XXD Contusion of lower back and pelvis, subsequent encounter: Secondary | ICD-10-CM

## 2024-02-11 LAB — BASIC METABOLIC PANEL
Anion gap: 7 (ref 5–15)
BUN: 22 mg/dL (ref 8–23)
CO2: 23 mmol/L (ref 22–32)
Calcium: 8.3 mg/dL — ABNORMAL LOW (ref 8.9–10.3)
Chloride: 99 mmol/L (ref 98–111)
Creatinine, Ser: 0.98 mg/dL (ref 0.44–1.00)
GFR, Estimated: 55 mL/min — ABNORMAL LOW (ref >60)
Glucose, Bld: 162 mg/dL — ABNORMAL HIGH (ref 70–99)
Potassium: 3.7 mmol/L (ref 3.5–5.1)
Sodium: 129 mmol/L — ABNORMAL LOW (ref 135–145)

## 2024-02-11 LAB — URINALYSIS, ROUTINE W REFLEX MICROSCOPIC
Bilirubin Urine: NEGATIVE
Glucose, UA: NEGATIVE mg/dL
Ketones, ur: NEGATIVE mg/dL
Nitrite: POSITIVE — AB
Protein, ur: 100 mg/dL — AB
Specific Gravity, Urine: 1.012 (ref 1.005–1.030)
Squamous Epithelial / HPF: 0 /HPF (ref 0–5)
WBC, UA: >50 WBC/hpf (ref 0–5)
pH: 5 (ref 5.0–8.0)

## 2024-02-11 LAB — CBG MONITORING, ED: Glucose-Capillary: 101 mg/dL — ABNORMAL HIGH (ref 70–99)

## 2024-02-11 LAB — CBC
HCT: 34 % — ABNORMAL LOW (ref 36.0–46.0)
Hemoglobin: 11.3 g/dL — ABNORMAL LOW (ref 12.0–15.0)
MCH: 30.5 pg (ref 26.0–34.0)
MCHC: 33.2 g/dL (ref 30.0–36.0)
MCV: 91.6 fL (ref 80.0–100.0)
Platelets: 229 10*3/uL (ref 150–400)
RBC: 3.71 MIL/uL — ABNORMAL LOW (ref 3.87–5.11)
RDW: 14 % (ref 11.5–15.5)
WBC: 15.2 10*3/uL — ABNORMAL HIGH (ref 4.0–10.5)
nRBC: 0 % (ref 0.0–0.2)

## 2024-02-11 LAB — LACTIC ACID, PLASMA
Lactic Acid, Venous: 1.5 mmol/L (ref 0.5–1.9)
Lactic Acid, Venous: 1.2 mmol/L (ref 0.5–1.9)

## 2024-02-11 LAB — RESP PANEL BY RT-PCR (RSV, FLU A&B, COVID)  RVPGX2
Influenza A by PCR: NEGATIVE
Influenza B by PCR: NEGATIVE
Resp Syncytial Virus by PCR: NEGATIVE
SARS Coronavirus 2 by RT PCR: NEGATIVE

## 2024-02-11 MED ORDER — CHLORHEXIDINE GLUCONATE CLOTH 2 % EX PADS
6.0000 | MEDICATED_PAD | Freq: Every day | CUTANEOUS | Status: DC
  Administered 2024-02-12 – 2024-02-16 (×5): 6 via TOPICAL

## 2024-02-11 MED ORDER — ENOXAPARIN SODIUM 40 MG/0.4ML IJ SOSY
40.0000 mg | PREFILLED_SYRINGE | INTRAMUSCULAR | Status: DC
  Administered 2024-02-11: 40 mg via SUBCUTANEOUS
  Filled 2024-02-11: qty 0.4

## 2024-02-11 MED ORDER — LEVOTHYROXINE SODIUM 50 MCG PO TABS
100.0000 ug | ORAL_TABLET | Freq: Every day | ORAL | Status: DC
  Administered 2024-02-12 – 2024-02-16 (×5): 100 ug via ORAL
  Filled 2024-02-11 (×5): qty 2

## 2024-02-11 MED ORDER — EZETIMIBE 10 MG PO TABS
10.0000 mg | ORAL_TABLET | Freq: Every day | ORAL | Status: DC
  Administered 2024-02-12 – 2024-02-16 (×5): 10 mg via ORAL
  Filled 2024-02-11 (×5): qty 1

## 2024-02-11 MED ORDER — LACTATED RINGERS IV SOLN
150.0000 mL/h | INTRAVENOUS | Status: DC
  Administered 2024-02-11 – 2024-02-12 (×3): 150 mL/h via INTRAVENOUS

## 2024-02-11 MED ORDER — TEMAZEPAM 7.5 MG PO CAPS
15.0000 mg | ORAL_CAPSULE | Freq: Every evening | ORAL | Status: DC | PRN
  Administered 2024-02-13 – 2024-02-14 (×2): 15 mg via ORAL
  Filled 2024-02-11 (×2): qty 2

## 2024-02-11 MED ORDER — HYDROCODONE-ACETAMINOPHEN 5-325 MG PO TABS
1.0000 | ORAL_TABLET | Freq: Four times a day (QID) | ORAL | Status: DC | PRN
  Administered 2024-02-12 – 2024-02-16 (×6): 1 via ORAL
  Filled 2024-02-11 (×7): qty 1

## 2024-02-11 MED ORDER — SODIUM CHLORIDE 0.9 % IV SOLN
2.0000 g | Freq: Three times a day (TID) | INTRAVENOUS | Status: DC
  Administered 2024-02-11 – 2024-02-12 (×2): 2 g via INTRAVENOUS
  Filled 2024-02-11 (×3): qty 12.5

## 2024-02-11 MED ORDER — PANTOPRAZOLE SODIUM 40 MG PO TBEC
40.0000 mg | DELAYED_RELEASE_TABLET | Freq: Every day | ORAL | Status: DC
  Administered 2024-02-12 – 2024-02-16 (×5): 40 mg via ORAL
  Filled 2024-02-11 (×5): qty 1

## 2024-02-11 MED ORDER — SODIUM CHLORIDE 0.9 % IV SOLN
2.0000 g | Freq: Once | INTRAVENOUS | Status: AC
  Administered 2024-02-11: 2 g via INTRAVENOUS
  Filled 2024-02-11: qty 12.5

## 2024-02-11 MED ORDER — ONDANSETRON HCL 4 MG PO TABS: 4.0000 mg | ORAL_TABLET | Freq: Four times a day (QID) | ORAL | Status: DC | PRN

## 2024-02-11 MED ORDER — LACTATED RINGERS IV BOLUS
1000.0000 mL | Freq: Once | INTRAVENOUS | Status: AC
  Administered 2024-02-11: 1000 mL via INTRAVENOUS

## 2024-02-11 MED ORDER — ONDANSETRON HCL 4 MG/2ML IJ SOLN: 4.0000 mg | Freq: Four times a day (QID) | INTRAMUSCULAR | Status: DC | PRN

## 2024-02-11 MED ORDER — ACETAMINOPHEN 325 MG PO TABS
650.0000 mg | ORAL_TABLET | Freq: Four times a day (QID) | ORAL | Status: DC | PRN
  Administered 2024-02-11: 650 mg via ORAL
  Filled 2024-02-11: qty 2

## 2024-02-11 NOTE — Assessment & Plan Note (Signed)
 Stable from a respiratory standpoint Continue home inhalers

## 2024-02-11 NOTE — Assessment & Plan Note (Signed)
Cont zoloft 

## 2024-02-11 NOTE — Assessment & Plan Note (Signed)
 Cont synthroid

## 2024-02-11 NOTE — Progress Notes (Signed)
 Pharmacy Antibiotic Note  Lauren Hammond is a 89 y.o. female admitted on 02/11/2024 with UTI.  Patient was checked about a week ago for possible urinary tract infection and then again a few days ago. Patient has occasional discomfort and a little bit of urinary leak around her catheter. Catheter has been removed x 2 but had to be replaced due to not being able to urinate without it. I do not see any treatment for possible UTI prior to arrival today. Pharmacy has been consulted for cefepime dosing.  Today, 02/22/24: WBC 15.2 >> 13.6, still elevated but improving Scr 0.95, baseline Afebrile  Plan: Adjust cefepime to 2 g IV q12H given reduced renal function Continue to monitor culture results Monitor renal function, length of therapy, and transition to PO when appropriate   Height: 5\' 6"  (167.6 cm) Weight: 65 kg (143 lb 4.8 oz) IBW/kg (Calculated) : 59.3  Temp (24hrs), Avg:98.5 F (36.9 C), Min:97.8 F (36.6 C), Max:99.1 F (37.3 C)  Recent Labs  Lab 02/11/24 1004 02/11/24 1505  WBC 15.2*  --   CREATININE 0.98  --   LATICACIDVEN  --  1.5    Estimated Creatinine Clearance: 36.4 mL/min (by C-G formula based on SCr of 0.98 mg/dL).    Allergies  Allergen Reactions   Atorvastatin Other (See Comments)    Muscle Aches   Bupropion Other (See Comments)    Unspecified   Other Other (See Comments)    Unspecified, passed out Reaction: patient was unable to communicate and family member was unable to tell me any allergies    Percodan [Oxycodone-Aspirin]    Pravastatin Other (See Comments)    Muscle Aches    Antimicrobials this admission: 3/18 Cefepime >>    Microbiology results: 3/18 BCx: NGTD 3/18 Ucx (catheterized): in process   Thank you for allowing pharmacy to be a part of this patient's care.  Will M. Dareen Piano, PharmD Clinical Pharmacist 02/12/2024 11:43 AM

## 2024-02-11 NOTE — Sepsis Progress Note (Signed)
 eLink is following this Code Sepsis.

## 2024-02-11 NOTE — Assessment & Plan Note (Addendum)
 Catheter associated UTI Urinary retention Progressive weakness fatigue malaise at home with lower abdominal pain with recent urinary catheter placement for urinary retention Meeting sepsis criteria with heart rate 100s, respirations 20s. White count 15 Lactate within normal limits Urinalysis indicative of infection Will place on healthcare associated UTI coverage given recent catheter/instrumentation placement as well as recent hospitalization IV cefepime S/p cathter exchange in ER  Panculture LR maintenance IV fluids Monitor Plan for urology follow-up outpatient

## 2024-02-11 NOTE — H&P (Addendum)
 History and Physical    Patient: Lauren Hammond XNA:[id number] DOB: 03-Feb-1932 DOA: 02/11/2024 DOS: the patient was seen and examined on 02/11/2024 PCP: Barbette Reichmann, MD  Patient coming from: Home  Chief Complaint:  Chief Complaint  Patient presents with   Weakness   HPI: Lauren Hammond is a 89 y.o. female with medical history significant of HFpEF, atrial fibrillation, hypertension, hyperlipidemia, COPD, depression, hypothyroidism presenting with sepsis, catheter associated UTI, weakness.  Patient noted to have been admitted March 2025 in the [facility] system for issues including fall, left distal radius fracture, pelvic fractures.  Also with pelvic hematoma urinary retention.  Patient was subsequently discharged with a Foley in place to inpatient rehab.  Recently discharged from inpatient rehab.  Per report, patient with worsening fatigue malaise at home.  Also with lower abdominal pain.  No fevers or chills.  Per the daughter, patient current lives at home by herself with noted worsening weakness. Presented to ER Tmax 99.1, heart rate 100s, respiration 20s, BP stable satting well on room air.  White count 15.2, hemoglobin 9.3, platelets 229, creatinine 0.98, sodium 129.  Urinalysis indicative of infection.  Chest x-ray stable. Review of Systems: As mentioned in the history of present illness. All other systems reviewed and are negative. Past Medical History:  Diagnosis Date   Barrett esophagus    Chronic heart failure with preserved ejection fraction (HFpEF) (HCC)    a. 11/2015 Echo: EF 50-55%; b. 05/2019 Echo: EF 60-65%; c. 07/2022 Echo: EF 60-65%, no rwma, mild asymm LVH of basal-septal wall, nl RV fxn, RVSP 38.41mmHg. Mildly dil LA. Mild MR. Mild-mod TR.   Depression    Generalized OA    HTN (hypertension)    Hyperlipemia    Hypothyroid    Insomnia    Lung cancer (HCC) 11/2015   treated with radiation   Permanent atrial fibrillation (HCC)    a. Dx 06/2022-->DCCV; b. 11/2022 recurrent  Afib-->tikosyn-->DCCV; c. 11/2022 ERAF-->Zio 100% AF burden; d. CHA2DS2VASc = 5-->xarelto.   Personal history of radiation therapy 2017   lung Ca   Uterine cancer (HCC) 1981   uterus ca with a hysterectomy   Past Surgical History:  Procedure Laterality Date   ABDOMINAL HYSTERECTOMY  1984   [place], [place]   APPENDECTOMY     BREAST BIOPSY Right 1980's   neg   CARDIOVERSION N/A 09/20/2022   Procedure: CARDIOVERSION;  Surgeon: Antonieta Iba, MD;  Location: [facility] ORS;  Service: Cardiovascular;  Laterality: N/A;   CARDIOVERSION N/A 12/06/2022   Procedure: CARDIOVERSION;  Surgeon: Chrystie Nose, MD;  Location: [facility] ENDOSCOPY;  Service: Cardiovascular;  Laterality: N/A;   KNEE ARTHROSCOPY Right 2000   [facility]   right breast bx     Social History:  reports that she has never smoked. She has never used smokeless tobacco. She reports that she does not drink alcohol and does not use drugs.  Allergies  Allergen Reactions   Atorvastatin Other (See Comments)    Muscle Aches   Bupropion Other (See Comments)    Unspecified   Other Other (See Comments)    Unspecified, passed out Reaction: patient was unable to communicate and family member was unable to tell me any allergies    Percodan [Oxycodone-Aspirin]    Pravastatin Other (See Comments)    Muscle Aches    Family History  Problem Relation Age of Onset   Hypertension Other    Breast cancer Maternal Aunt    Breast cancer Sister 35  Prior to Admission medications   Medication Sig Start Date End Date Taking? Authorizing Provider  amLODipine (NORVASC) 2.5 MG tablet TAKE 1 TABLET BY MOUTH EVERY DAY 08/21/23   Creig Hines, NP  ezetimibe (ZETIA) 10 MG tablet Take 10 mg by mouth daily.    [provider]  furosemide (LASIX) 20 MG tablet Take 1 tablet (20 mg total) by mouth as needed (swelling). 12/25/22 11/15/23  Antonieta Iba, MD  gabapentin (NEURONTIN) 100 MG capsule Take 100 mg by mouth daily as  needed (nerve pain). 01/17/22   [provider]  HYDROcodone-acetaminophen (NORCO/VICODIN) 5-325 MG tablet Take 1 tablet by mouth daily as needed for moderate pain.     [provider]  ibuprofen (ADVIL) 200 MG tablet Take 200 mg by mouth every 6 (six) hours as needed.    [provider]  levothyroxine (SYNTHROID) 100 MCG tablet Take 100 mcg by mouth daily.    [provider]  losartan (COZAAR) 25 MG tablet Takes 1 tablet by mouth as needed only if blood pressure is up. Patient taking differently: Take 25 mg by mouth daily as needed (If blood pressure is up). 12/04/22   Newman Nip, NP  mirtazapine (REMERON) 7.5 MG tablet Take 7.5 mg by mouth. 02/06/23 02/06/24  [provider]  Multiple Vitamin (MULTI-VITAMINS) TABS Take 1 tablet by mouth daily.    [provider]  pantoprazole (PROTONIX) 40 MG tablet Take 1 tablet by mouth daily. 10/14/23   [provider]  potassium chloride SA (KLOR-CON M) 20 MEQ tablet Take 1 tablet (20 mEq total) by mouth daily. 12/08/22   Sheilah Pigeon, PA-C  Rivaroxaban (XARELTO) 15 MG TABS tablet Take 1 tablet (15 mg total) by mouth daily with supper. 09/20/23   Antonieta Iba, MD  sertraline (ZOLOFT) 100 MG tablet Take 100 mg by mouth daily.    [provider]  temazepam (RESTORIL) 15 MG capsule Take 15 mg by mouth at bedtime as needed for sleep. 03/21/16   [provider]  tetrahydrozoline 0.05 % ophthalmic solution Place 1 drop into both eyes as needed (redness).    [provider]  VITAMIN D PO Take 1 tablet by mouth daily.    [provider]    Physical Exam: Vitals:   02/11/24 1548 02/11/24 1600 02/11/24 1615 02/11/24 1729  BP:  134/65  (!) 116/57  Pulse:  (!) 126 (!) 107 (!) 107  Resp: (!) 22 20  16   Temp:    97.9 F (36.6 C)  TempSrc:      SpO2:  92%  91%  Weight:      Height:       Physical Exam Constitutional:      Appearance: She is normal  weight.  HENT:     Head: Normocephalic and atraumatic.     Nose: Nose normal.     Mouth/Throat:     Mouth: Mucous membranes are moist.  Eyes:     Pupils: Pupils are equal, round, and reactive to light.  Cardiovascular:     Rate and Rhythm: Normal rate and regular rhythm.  Pulmonary:     Effort: Pulmonary effort is normal.  Abdominal:     General: Bowel sounds are normal.     Comments: Positive mild lower abdominal pain  Musculoskeletal:     Comments: Left wrist cast in place  Skin:    General: Skin is warm.  Neurological:     General: No focal deficit present.  Psychiatric:        Mood and Affect: Mood normal.     Data Reviewed:  There are no new results to review at this time.  DG Chest Portable 1 View CLINICAL DATA:  Sepsis  EXAM: PORTABLE CHEST 1 VIEW  COMPARISON:  X-ray 05/26/2019.  CT chest 05/10/2020  FINDINGS: Slight elevation left hemidiaphragm, similar to previous. No pneumothorax, effusion or edema. Normal cardiopericardial silhouette calcified. No consolidation. Interstitial changes seen lungs which are chronic. Overlapping cardiac leads.  IMPRESSION: Underinflation with slightly elevated left hemidiaphragm. Chronic lung changes.  Electronically Signed   By: Karen Kays M.D.   On: 02/11/2024 17:07  Lab Results  Component Value Date   WBC 15.2 (H) 02/11/2024   HGB 11.3 (L) 02/11/2024   HCT 34.0 (L) 02/11/2024   MCV 91.6 02/11/2024   PLT 229 02/11/2024   Last metabolic panel Lab Results  Component Value Date   GLUCOSE 162 (H) 02/11/2024   NA 129 (L) 02/11/2024   K 3.7 02/11/2024   CL 99 02/11/2024   CO2 23 02/11/2024   BUN 22 02/11/2024   CREATININE 0.98 02/11/2024   GFRNONAA 55 (L) 02/11/2024   CALCIUM 8.3 (L) 02/11/2024   PROT 7.9 12/01/2015   ALBUMIN 4.2 12/01/2015   BILITOT 0.5 12/01/2015   ALKPHOS 52 12/01/2015   AST 19 12/01/2015   ALT 13 (L) 12/01/2015   ANIONGAP 7 02/11/2024    Assessment and Plan: Sepsis  (HCC) Catheter associated UTI Urinary retention Progressive weakness fatigue malaise at home with lower abdominal pain with recent urinary catheter placement for urinary retention Meeting sepsis criteria with heart rate 100s, respirations 20s. White count 15 Lactate within normal limits Urinalysis indicative of infection Will place on healthcare associated UTI coverage given recent catheter/instrumentation placement as well as recent hospitalization IV cefepime S/p cathter exchange in ER  Panculture LR maintenance IV fluids Monitor Plan for urology follow-up outpatient   History of fall Pelvic fracture  L distal radius fracture Noted recent admission in the [facility] system for issues including:  Fall , L distal Radius Fracture/ Parasymphyseal pubic fx mildly displaced and L inferior pubic ramus fx, and suspected L sacral fx Status post course of inpatient rehabilitation Noted worsening weakness in the setting of sepsis Fall precautions PT OT evaluation  Pain control  Would likely benefit from inpatient rehab versus SNF placement and discussion with family was is concerned about her home situation   Atrial fibrillation (HCC) Baseline history of atrial fibrillation Not on rate controlling agents at present-add as needed Anticoagulation on hold in the setting of recent pelvic hematoma Monitor   COPD (chronic obstructive pulmonary disease) (HCC) Stable from a respiratory standpoint Continue home inhalers  Adult hypothyroidism Cont synthroid    HLD (hyperlipidemia) Cont zetia   Clinical depression Cont zoloft        Advance Care Planning:   Code Status: Limited: Do not attempt resuscitation (DNR) -DNR-LIMITED -Do Not Intubate/DNI    Consults: None   Family Communication: Plan of care discussed w/ daughter over the phone   Severity of Illness: The appropriate patient status for this patient is INPATIENT. Inpatient status is judged to be reasonable and necessary in  order to provide the required intensity of service to ensure the patient's safety. The patient's presenting symptoms, physical exam findings, and initial radiographic and laboratory data in the context of their chronic comorbidities is felt to place them at high risk for further clinical deterioration. Furthermore, it is not anticipated that the  patient will be medically stable for discharge from the hospital within 2 midnights of admission.   * I certify that at the point of admission it is my clinical judgment that the patient will require inpatient hospital care spanning beyond 2 midnights from the point of admission due to high intensity of service, high risk for further deterioration and high frequency of surveillance required.*  Author: Floydene Flock, MD 02/11/2024 5:32 PM  For on call review [email or web address].

## 2024-02-11 NOTE — Assessment & Plan Note (Signed)
 Cont zetia

## 2024-02-11 NOTE — ED Triage Notes (Signed)
 Patient to ED via [facility] from home for generalized weakness. Started this AM- pt states she was unable to stand. Dc'd from rehab yesterday after a fall 2 weeks ago.

## 2024-02-11 NOTE — ED Notes (Signed)
 Pt transported to [facility] unit, no incident. Handoff given by Lyla Son, RN, Home bedsheets given to family member.

## 2024-02-11 NOTE — ED Notes (Signed)
Lab in room now

## 2024-02-11 NOTE — ED Provider Notes (Signed)
 [facility] Provider Note    Event Date/Time   First MD Initiated Contact with Patient 02/11/24 1420     (approximate)   History   Weakness   HPI  Lauren Hammond is a 89 y.o. female history of recent admission for a pelvic fracture and rehabilitation stay at [facility].  She is here with her daughter reports she was doing well until yesterday when she was being discharged from the care facility back to her home she felt weak fatigued some chills and fatigue   She was checked about a week ago for possible urinary tract infection.  Then I had considered she might have one a few days ago as well she would occasionally have some discomfort and a little bit of urinary leak around her catheter.  The catheter is continue to drain urine.  She has had 2 attempts at having the catheter removed but could not urinate thereafter.  Has urinary catheter back in  Physical Exam   Triage Vital Signs: ED Triage Vitals [02/11/24 1002]  Encounter Vitals Group     BP 121/66     Systolic BP Percentile      Diastolic BP Percentile      Pulse Rate (!) 102     Resp 17     Temp 97.8 F (36.6 C)     Temp Source Oral     SpO2 91 %     Weight 143 lb 4.8 oz (65 kg)     Height 5\' 6"  (1.676 m)     Head Circumference      Peak Flow      Pain Score 2     Pain Loc      Pain Education      Exclude from Growth Chart     Most recent vital signs: Vitals:   02/11/24 1400 02/11/24 1405  BP: (!) 149/75   Pulse: (!) 117   Resp: (!) 23   Temp:  99.1 F (37.3 C)  SpO2: 93%      General: Awake, no distress.  She is pleasant but appears slightly fatigued.  Mildly dry mucous membranes.  Mild tachycardia. CV:  Good peripheral perfusion.  Clear bilateral lungs.  Slight tachycardia Resp:  Normal effort.  No cough patient denies fevers chills chest pain or trouble breathing Abd:  No distention.  Soft nontender nondistended except reports some mild discomfort to palpation in the  suprapubic region. Other:  Foley catheter appears to be in place draining slightly dark but transparent urine   ED Results / Procedures / Treatments   Labs (all labs ordered are listed, but only abnormal results are displayed) Labs Reviewed  BASIC METABOLIC PANEL - Abnormal; Notable for the following components:      Result Value   Sodium 129 (*)    Glucose, Bld 162 (*)    Calcium 8.3 (*)    GFR, Estimated 55 (*)    All other components within normal limits  CBC - Abnormal; Notable for the following components:   WBC 15.2 (*)    RBC 3.71 (*)    Hemoglobin 11.3 (*)    HCT 34.0 (*)    All other components within normal limits  URINALYSIS, ROUTINE W REFLEX MICROSCOPIC - Abnormal; Notable for the following components:   Color, Urine YELLOW (*)    APPearance CLOUDY (*)    Hgb urine dipstick MODERATE (*)    Protein, ur 100 (*)    Nitrite POSITIVE (*)  Leukocytes,Ua LARGE (*)    Bacteria, UA MANY (*)    All other components within normal limits  CBG MONITORING, ED - Abnormal; Notable for the following components:   Glucose-Capillary 101 (*)    All other components within normal limits  CULTURE, BLOOD (ROUTINE X 2)  CULTURE, BLOOD (ROUTINE X 2)  RESP PANEL BY RT-PCR (RSV, FLU A&B, COVID)  RVPGX2  URINE CULTURE  LACTIC ACID, PLASMA  LACTIC ACID, PLASMA   Urinalysis concerning for urinary tract infection.  Catheter-related sample but bacterial leukocytes nitrates all positive.  Also leukocytosis  Patient noted to meet sepsis criteria on my evaluation at approximately 2:20 PM.  EKG  Interpreted by me at 1010 heart rate 100 QRS 90 QTc 540 Slight baseline wander, suspicious for atrial fibrillation with rapid ventricular response though some artifact is present.  Occasional areas that are difficult to discern if P waves are present, nonetheless appears to be without evidence of obvious frank ischemia.  In V3 I suspect there are possibly some P waves in a first-degree AV block but  difficult to discern cannot exclude A-fib   RADIOLOGY  Cxr *** [Pending at time of admission decision, Dr. Alvester Morin aware of pending radiology testing]   PROCEDURES:  Critical Care performed: Yes, see critical care procedure note(s)  Procedures   MEDICATIONS ORDERED IN ED: Medications  lactated ringers bolus 1,000 mL (has no administration in time range)  ceFEPIme (MAXIPIME) 2 g in sodium chloride 0.9 % 100 mL IVPB (has no administration in time range)     IMPRESSION / MDM / ASSESSMENT AND PLAN / ED COURSE  I reviewed the triage vital signs and the nursing notes.                              Differential diagnosis includes, but is not limited to, catheter associated urinary tract infection with Foley catheter in place.  No flank or back pain no nausea or vomiting.  She does appear fatigued slight tachycardia and his leukocytosis.  High risk for catheter associated urinary tract infection.  Will start on broad-spectrum cephalosporin.  IV hydration blood cultures lactic acid pending.  Code sepsis initiated  Patient's presentation is most consistent with acute presentation with potential threat to life or bodily function.  CRITICAL CARE Performed by: Sharyn Creamer   Total critical care time: 25 minutes  Critical care time was exclusive of separately billable procedures and treating other patients.  Critical care was necessary to treat or prevent imminent or life-threatening deterioration.  Critical care was time spent personally by me on the following activities: development of treatment plan with patient and/or surrogate as well as nursing, discussions with consultants, evaluation of patient's response to treatment, examination of patient, obtaining history from patient or surrogate, ordering and performing treatments and interventions, ordering and review of laboratory studies, ordering and review of radiographic studies, pulse oximetry and re-evaluation of patient's  condition.   Chest x-ray and COVID testing performed to evaluate for potential pulmonary cause though based on clinical history and examination this is likely is related to urinary catheter.  Urine culture  Consulted with and patient accepted to hospitalist service by Dr. Alvester Morin     FINAL CLINICAL IMPRESSION(S) / ED DIAGNOSES   Final diagnoses:  Urinary tract infection associated with indwelling urethral catheter, initial encounter (HCC)  Sepsis, due to unspecified organism, unspecified whether acute organ dysfunction present (HCC)     Rx / DC Orders  ED Discharge Orders     None        Note:  This document was prepared using Dragon voice recognition software and may include unintentional dictation errors.

## 2024-02-11 NOTE — ED Notes (Signed)
 Per MD Alvester Morin to exchange foley catheter. New foley inserted and documented.

## 2024-02-11 NOTE — Progress Notes (Signed)
 CODE SEPSIS - PHARMACY COMMUNICATION  **Broad Spectrum Antibiotics should be administered within 1 hour of Sepsis diagnosis**  Time Code Sepsis Called/Page Received: 1438  Antibiotics Ordered: cefepime  Time of 1st antibiotic administration: 1451  Additional action taken by pharmacy: N/A  If necessary, Name of Provider/Nurse Contacted: N/A    Merryl Hacker ,PharmD Clinical Pharmacist  02/11/2024  2:39 PM

## 2024-02-11 NOTE — ED Notes (Addendum)
 RN Unable to obtain 2nd set of Blood cultures. Called lab to obtain 2nd set of Blood Cultures. Lab sending phlebotomist. RN Did not delay antibiotics.

## 2024-02-11 NOTE — Assessment & Plan Note (Signed)
 Baseline history of atrial fibrillation Not on rate controlling agents at present-add as needed Anticoagulation on hold in the setting of recent pelvic hematoma Monitor

## 2024-02-11 NOTE — Assessment & Plan Note (Addendum)
 Pelvic fracture  L distal radius fracture Noted recent admission in the [facility] system for issues including:  Fall , L distal Radius Fracture/ Parasymphyseal pubic fx mildly displaced and L inferior pubic ramus fx, and suspected L sacral fx Status post course of inpatient rehabilitation Noted worsening weakness in the setting of sepsis Fall precautions PT OT evaluation  Pain control  Would likely benefit from inpatient rehab versus SNF placement and discussion with family was is concerned about her home situation

## 2024-02-12 DIAGNOSIS — N390 Urinary tract infection, site not specified: Secondary | ICD-10-CM | POA: Diagnosis not present

## 2024-02-12 LAB — COMPREHENSIVE METABOLIC PANEL
ALT: 15 U/L (ref 0–44)
AST: 27 U/L (ref 15–41)
Albumin: 2.8 g/dL — ABNORMAL LOW (ref 3.5–5.0)
Alkaline Phosphatase: 104 U/L (ref 38–126)
Anion gap: 10 (ref 5–15)
BUN: 19 mg/dL (ref 8–23)
CO2: 23 mmol/L (ref 22–32)
Calcium: 8.5 mg/dL — ABNORMAL LOW (ref 8.9–10.3)
Chloride: 101 mmol/L (ref 98–111)
Creatinine, Ser: 0.95 mg/dL (ref 0.44–1.00)
GFR, Estimated: 57 mL/min — ABNORMAL LOW (ref >60)
Glucose, Bld: 118 mg/dL — ABNORMAL HIGH (ref 70–99)
Potassium: 3.5 mmol/L (ref 3.5–5.1)
Sodium: 134 mmol/L — ABNORMAL LOW (ref 135–145)
Total Bilirubin: 0.8 mg/dL (ref 0.0–1.2)
Total Protein: 6.8 g/dL (ref 6.5–8.1)

## 2024-02-12 LAB — CBC
HCT: 40.4 % (ref 36.0–46.0)
Hemoglobin: 13.2 g/dL (ref 12.0–15.0)
MCH: 30.9 pg (ref 26.0–34.0)
MCHC: 32.7 g/dL (ref 30.0–36.0)
MCV: 94.6 fL (ref 80.0–100.0)
Platelets: 224 10*3/uL (ref 150–400)
RBC: 4.27 MIL/uL (ref 3.87–5.11)
RDW: 14.5 % (ref 11.5–15.5)
WBC: 13.6 10*3/uL — ABNORMAL HIGH (ref 4.0–10.5)
nRBC: 0 % (ref 0.0–0.2)

## 2024-02-12 MED ORDER — RIVAROXABAN 15 MG PO TABS
15.0000 mg | ORAL_TABLET | Freq: Every day | ORAL | Status: DC
  Administered 2024-02-12 – 2024-02-15 (×4): 15 mg via ORAL
  Filled 2024-02-12 (×5): qty 1

## 2024-02-12 MED ORDER — SERTRALINE HCL 50 MG PO TABS
100.0000 mg | ORAL_TABLET | Freq: Every day | ORAL | Status: DC
  Administered 2024-02-12 – 2024-02-16 (×5): 100 mg via ORAL
  Filled 2024-02-12 (×5): qty 2

## 2024-02-12 MED ORDER — SODIUM CHLORIDE 0.9 % IV SOLN
2.0000 g | Freq: Two times a day (BID) | INTRAVENOUS | Status: DC
  Administered 2024-02-12 – 2024-02-13 (×3): 2 g via INTRAVENOUS
  Filled 2024-02-12 (×4): qty 12.5

## 2024-02-12 MED ORDER — ACETAMINOPHEN 325 MG PO TABS
650.0000 mg | ORAL_TABLET | Freq: Four times a day (QID) | ORAL | Status: DC | PRN
  Administered 2024-02-13: 650 mg via ORAL
  Filled 2024-02-12: qty 2

## 2024-02-12 NOTE — Evaluation (Signed)
 Physical Therapy Evaluation Patient Details Name: Lauren Hammond MRN: [id number] DOB: [birth year] Today's Date: 02/12/2024  History of Present Illness  presented to ER from home secondary to progressive weakness, general malaise; admited for mangement of sepsis related to UTI.  Of note, recent R distal radial fracture, pelvic fracture and pelvic hematoma s/p fall; WBAT bilat UE/LE per previous records  Clinical Impression  Patient resting in bed upon arrival to room; supportive daughter at bedside.  Patient alert and oriented, follows all commands and agreeable to participation with session.  Denies pain; endorses up in recliner (after OT) for approx 2 hours.  Bilat UE/LE strength and ROM grossly symmetrical and WFL for basic transfers and mobility; no focal weakness appreciated, except for L wrist (immobilized in soft case/splint).  Able to complete bed mobility with supervision; sit/stand, basic transfers and gait (50') with RW, min assist.  Demonstrates reciprocal stepping pattern with fair step height/length; slow, cautious cadence, but no buckling or LOB. Moderate reliance on RW for safety/stability.  Do recommend use of RW and +1 for all gait efforts at this time; anticipate consistent progress towards mobility goals with continued therapy and medical management of acute infection. Would benefit from skilled PT to address above deficits and promote optimal return to PLOF.; recommend post-acute PT follow up as indicated by interdisciplinary care team.          If plan is discharge home, recommend the following: A little help with walking and/or transfers;A little help with bathing/dressing/bathroom   Can travel by private vehicle        Equipment Recommendations Rolling walker (2 wheels)  Recommendations for Other Services       Functional Status Assessment Patient has had a recent decline in their functional status and demonstrates the ability to make significant improvements in function  in a reasonable and predictable amount of time.     Precautions / Restrictions Precautions Precautions: Fall Restrictions Weight Bearing Restrictions Per Provider Order: Yes LUE Weight Bearing Per Provider Order: Weight bearing as tolerated RLE Weight Bearing Per Provider Order: Weight bearing as tolerated LLE Weight Bearing Per Provider Order: Weight bearing as tolerated      Mobility  Bed Mobility Overal bed mobility: Needs Assistance Bed Mobility: Supine to Sit     Supine to sit: Supervision     General bed mobility comments: increased time/effort, transition towards L side of bed    Transfers Overall transfer level: Needs assistance Equipment used: Rolling walker (2 wheels) Transfers: Sit to/from Stand Sit to Stand: Min assist           General transfer comment: does require UE support to initiate/complete; min assist for lift off and stabilization.  Mild sway in A/P plane, but does self-correct with increased time/attention    Ambulation/Gait Ambulation/Gait assistance: Min assist, +2 safety/equipment Gait Distance (Feet): 50 Feet Assistive device: Rolling walker (2 wheels)         General Gait Details: reciprocal stepping pattern with fair step height/length; slow, cautious cadence, but no buckling or LOB.  Moderate reliance on RW for safety/stability  Stairs            Wheelchair Mobility     Tilt Bed    Modified Rankin (Stroke Patients Only)       Balance Overall balance assessment: Needs assistance Sitting-balance support: No upper extremity supported, Feet supported Sitting balance-Leahy Scale: Good     Standing balance support: Bilateral upper extremity supported Standing balance-Leahy Scale: Poor  Pertinent Vitals/Pain Pain Assessment Pain Assessment: No/denies pain    Home Living Family/patient expects to be discharged to:: Private residence Living Arrangements: Alone Available  Help at Discharge: Family Type of Home: House Home Access: Level entry       Home Layout: One level Home Equipment: Agricultural consultant (2 wheels);Grab bars - tub/shower;Grab bars - toilet      Prior Function Prior Level of Function : Independent/Modified Independent;Needs assist             Mobility Comments: Indep without assist device for household mobility ADLs Comments: Pt living at home alone and Ind for ADLs and IADLs. Pt does have MD so daughter assists with groceries and appointments as pt does not drive (3x/wk)     Extremity/Trunk Assessment   Upper Extremity Assessment LUE Deficits / Details: Lradial fx earlier this month secondary to fall with soft brace donned and WBAT    Lower Extremity Assessment Lower Extremity Assessment: Generalized weakness (grossly 4-/5 throughout)       Communication        Cognition Arousal: Alert Behavior During Therapy: WFL for tasks assessed/performed   PT - Cognitive impairments: No apparent impairments                                 Cueing       General Comments      Exercises     Assessment/Plan    PT Assessment Patient needs continued PT services  PT Problem List Decreased strength;Decreased range of motion;Decreased activity tolerance;Decreased balance;Cardiopulmonary status limiting activity;Decreased coordination;Decreased mobility;Decreased knowledge of use of DME;Decreased safety awareness;Decreased knowledge of precautions       PT Treatment Interventions DME instruction;Gait training;Functional mobility training;Therapeutic activities;Therapeutic exercise;Balance training;Patient/family education    PT Goals (Current goals can be found in the Care Plan section)  Acute Rehab PT Goals Patient Stated Goal: to get stronger and get back home PT Goal Formulation: With patient/family Time For Goal Achievement: 02/26/24 Potential to Achieve Goals: Good    Frequency Min 3X/week      Co-evaluation               AM-PAC PT "6 Clicks" Mobility  Outcome Measure Help needed turning from your back to your side while in a flat bed without using bedrails?: None Help needed moving from lying on your back to sitting on the side of a flat bed without using bedrails?: A Little Help needed moving to and from a bed to a chair (including a wheelchair)?: A Little Help needed standing up from a chair using your arms (e.g., wheelchair or bedside chair)?: A Little Help needed to walk in hospital room?: A Little Help needed climbing 3-5 steps with a railing? : A Lot 6 Click Score: 18    End of Session Equipment Utilized During Treatment: Gait belt Activity Tolerance: Patient tolerated treatment well Patient left: in chair;with call bell/phone within reach;with family/visitor present Nurse Communication: Mobility status PT Visit Diagnosis: Muscle weakness (generalized) (M62.81);Difficulty in walking, not elsewhere classified (R26.2)    Time: 3664-4034 PT Time Calculation (min) (ACUTE ONLY): 26 min   Charges:   PT Evaluation $PT Eval Moderate Complexity: 1 Mod   PT General Charges $$ ACUTE PT VISIT: 1 Visit         Adeleine Pask H. Manson Passey, PT, DPT, NCS 02/12/24, 3:50 PM [phone]

## 2024-02-12 NOTE — Evaluation (Signed)
 Occupational Therapy Evaluation Patient Details Name: Lauren Hammond MRN: [id number] DOB: 01/03/1932 Today's Date: 02/12/2024   History of Present Illness   89 y.o. female with medical history significant of HFpEF, atrial fibrillation, hypertension, hyperlipidemia, COPD, depression, hypothyroidism presenting with sepsis, catheter associated UTI, weakness.  Patient noted to have been admitted March 2025 in the [facility] system for issues including fall, left distal radius fracture, pelvic fractures.  Also with pelvic hematoma urinary retention.  Patient was subsequently discharged with a Foley in place to inpatient rehab.  Recently discharged from inpatient rehab.  Per report, patient with worsening fatigue malaise at home.  Also with lower abdominal pain.  No fevers or chills.  Per the daughter, patient current lives at home by herself with noted worsening weakness.     Clinical Impressions Patient presenting with decreased Ind in self care,balance, functional mobility/transfers, endurance, and safety awareness. Patient reports being Mod I at baseline and living at home alone. She is visually impaired with several modifications at home. Her daughter comes over 3x/wk to assist as needed as pt does not drive. Pt with recent fall this month and d/c from rehab facility back home for 1 day before returning to hospital. Pt is currently WBAT for pelvic fx and L radial fx. Patient currently functioning at mod A for LB self care and bed mobility. Pt standing with mod A from standard bed height with use of RW. Posterior bias noted in standing and min A to step pivot to recliner chair at end of session. Call bell and all needed items within reach. Patient will benefit from acute OT to increase overall independence in the areas of ADLs, functional mobility, and safety awareness in order to safely discharge.     If plan is discharge home, recommend the following:   A lot of help with walking and/or transfers;A lot of  help with bathing/dressing/bathroom;Assistance with cooking/housework;Assist for transportation;Help with stairs or ramp for entrance     Functional Status Assessment   Patient has had a recent decline in their functional status and demonstrates the ability to make significant improvements in function in a reasonable and predictable amount of time.     Equipment Recommendations   BSC/3in1      Precautions/Restrictions   Precautions Precautions: Fall Restrictions Other Position/Activity Restrictions: WBAT pelvic fx and L radial fx with soft brace     Mobility Bed Mobility Overal bed mobility: Needs Assistance Bed Mobility: Supine to Sit     Supine to sit: Mod assist          Transfers Overall transfer level: Needs assistance Equipment used: Rolling walker (2 wheels) Transfers: Sit to/from Stand, Bed to chair/wheelchair/BSC Sit to Stand: Mod assist     Step pivot transfers: Min assist     General transfer comment: posterior bias in standing      Balance Overall balance assessment: Needs assistance Sitting-balance support: Feet supported Sitting balance-Leahy Scale: Good     Standing balance support: Reliant on assistive device for balance, During functional activity, Bilateral upper extremity supported Standing balance-Leahy Scale: Fair                             ADL either performed or assessed with clinical judgement   ADL Overall ADL's : Needs assistance/impaired                         Toilet Transfer: Moderate assistance;Rolling walker (2  wheels);Regular Teacher, adult education Details (indicate cue type and reason): simulated Toileting- Clothing Manipulation and Hygiene: Moderate assistance;Sit to/from stand               Vision Baseline Vision/History: 6 Macular Degeneration Additional Comments: visually impaired at baseline with adaptations at home to utilize microwave and washing machine             Pertinent Vitals/Pain Pain Assessment Pain Assessment: No/denies pain     Extremity/Trunk Assessment Upper Extremity Assessment Upper Extremity Assessment: Right hand dominant;LUE deficits/detail LUE Deficits / Details: Lradial fx earlier this month secondary to fall with soft brace donned and WBAT           Communication Communication Communication: No apparent difficulties   Cognition Arousal: Alert Behavior During Therapy: WFL for tasks assessed/performed Cognition: No apparent impairments                               Following commands: Intact                  Home Living Family/patient expects to be discharged to:: Private residence Living Arrangements: Alone Available Help at Discharge: Family;Available PRN/intermittently Type of Home: House Home Access: Level entry     Home Layout: One level     Bathroom Shower/Tub: Producer, television/film/video: Handicapped height Bathroom Accessibility: Yes   Home Equipment: Rolling Walker (2 wheels);Grab bars - tub/shower;Grab bars - toilet          Prior Functioning/Environment Prior Level of Function : Independent/Modified Independent;Needs assist               ADLs Comments: Pt living at home alone and Ind for ADLs and IADLs. Pt does have MD so daughter assists with groceries and appointments as pt does not drive (3x/wk)    OT Problem List: Decreased strength;Decreased range of motion;Decreased activity tolerance;Decreased safety awareness;Impaired balance (sitting and/or standing);Decreased knowledge of use of DME or AE;Impaired UE functional use   OT Treatment/Interventions: Self-care/ADL training;Therapeutic exercise;Therapeutic activities;Energy conservation;DME and/or AE instruction;Patient/family education;Visual/perceptual remediation/compensation;Balance training      OT Goals(Current goals can be found in the care plan section)   Acute Rehab OT Goals Patient Stated  Goal: to go home OT Goal Formulation: With patient Time For Goal Achievement: 02/26/24 Potential to Achieve Goals: Fair ADL Goals Pt Will Perform Grooming: with modified independence;standing Pt Will Perform Lower Body Dressing: with supervision;sit to/from stand Pt Will Transfer to Toilet: with supervision;ambulating Pt Will Perform Toileting - Clothing Manipulation and hygiene: with supervision;sit to/from stand   OT Frequency:  Min 2X/week       AM-PAC OT "6 Clicks" Daily Activity     Outcome Measure Help from another person eating meals?: A Little Help from another person taking care of personal grooming?: A Little Help from another person toileting, which includes using toliet, bedpan, or urinal?: A Lot Help from another person bathing (including washing, rinsing, drying)?: A Lot Help from another person to put on and taking off regular upper body clothing?: A Little Help from another person to put on and taking off regular lower body clothing?: A Lot 6 Click Score: 15   End of Session Equipment Utilized During Treatment: Rolling walker (2 wheels) Nurse Communication: Mobility status  Activity Tolerance: Patient limited by fatigue Patient left: in chair;with call bell/phone within reach;with chair alarm set  OT Visit Diagnosis: Unsteadiness on feet (R26.81);Repeated falls (R29.6);Muscle weakness (generalized) (  M62.81);History of falling (Z91.81)                Time: 4098-1191 OT Time Calculation (min): 25 min Charges:  OT General Charges $OT Visit: 1 Visit OT Evaluation $OT Eval Moderate Complexity: 1 Mod OT Treatments $Self Care/Home Management : 8-22 mins  Jackquline Denmark, MS, OTR/L , CBIS ascom [phone]  02/12/24, 11:41 AM

## 2024-02-12 NOTE — TOC Progression Note (Addendum)
 Transition of Care [facility]) - Progression Note    Patient Details  Name: Lauren Hammond MRN: [id number] Date of Birth: August 24, 1932  [facility] CM/SW Contact  Marlowe Sax, RN Phone Number: 02/12/2024, 3:18 PM  Clinical Narrative:      [facility] came to see the patient and asked that I call her, she stated that they provide services that have no min hour requirements no contracts and no pay up front,  she has her signed up for care giving and not started care yet Spoke with Lamar Laundry the daughter and she stated that the patient had just gotten out of rehab,  She would like to have home health come out, she has been using [facility] She has a standard rw and will need a rolling walker She spent 2 weeks in rehab and started the 3rd week She stated that she lives by herself and would like to have her functioning by herself some of the time  She is looking to go back to a rehab facility even for a few days   She was at [facility] rehab in [place] for a couple of weeks,  She asked that I call them to see if they have any openings I explained that I can call and get information to them after the PT eval is in  She is doing well and the recommendation is [facility] She was set up for [facility] at [place] from rehab    Expected Discharge Plan and Services                                               Social Determinants of Health (SDOH) Interventions SDOH Screenings   Food Insecurity: No Food Insecurity (02/11/2024)  Housing: Unknown (02/11/2024)  Transportation Needs: No Transportation Needs (02/11/2024)  Utilities: Not At Risk (02/11/2024)  Financial Resource Strain: Low Risk  (01/15/2024)   Received from [facility]  Social Connections: Unknown (02/11/2024)  Tobacco Use: Low Risk  (02/11/2024)  Health Literacy: Medium Risk (01/31/2024)   Received from [facility]    Readmission Risk Interventions     No data to display

## 2024-02-12 NOTE — Progress Notes (Addendum)
 PROGRESS NOTE    Lauren Hammond  ZOX:[id number] DOB: [birth year] DOA: 02/11/2024 PCP: Barbette Reichmann, MD   Assessment & Plan:   Principal Problem:   UTI (urinary tract infection) Active Problems:   Sepsis (HCC)   Atrial fibrillation (HCC)   History of fall   Clinical depression   HLD (hyperlipidemia)   Adult hypothyroidism   COPD (chronic obstructive pulmonary disease) (HCC)  Assessment and Plan: Sepsis: met criteria w/ tachycardia, tachypnea & likely secondary to catheter associated UTI. Continue on IV cefepime.   Catheter associated UTI: continue on IV cefepime. Catheter exchanged in the ER   Urinary retention: s/p catheter exchange in the ER. Continue w/ supportive care. Will need to f/u outpatient w/ uro    Pelvic fracture:w/ recent pelvic hematoma. Likely secondary to fall at home. Noted for this on admission at [facility]. PT/OT consulted   L distal radius fracture: likely secondary to fall at home. Noted for this on admission at [facility]. PT/OT consulted    Likely PAF: not on rate controlling meds as per med rec. Restart home dose of xarelto. Monitor for signs/symptoms of bleeding w/ recent hx of pelvic hematoma   COPD: w/o exacerbation. Bronchodilators prn    Hypothyroidism: continue on home dose of synthroid    HLD: continue on home dose of zetia   Depression: severity unknown. Restart home dose of sertraline           DVT prophylaxis: xarelto  Code Status: DNR Family Communication:  Disposition Plan: depends on PT/OT  Level of care: Telemetry Medical  Status is: Inpatient Remains inpatient appropriate because: severity of illness    Consultants:    Procedures:   Antimicrobials:   Subjective: Pt c/o fatigue   Objective: Vitals:   02/11/24 1729 02/11/24 2214 02/12/24 0428 02/12/24 0805  BP: (!) 116/57 (!) 101/54 (!) 96/52 (!) 136/103  Pulse: (!) 107 89 84 99  Resp: 16 18 18 16   Temp: 97.9 F (36.6 C) 97.8 F (36.6 C) 98.2 F (36.8 C) (!) 97.4  F (36.3 C)  TempSrc: Oral  Oral   SpO2: 91% 93% 92% 91%  Weight:      Height:        Intake/Output Summary (Last 24 hours) at 02/12/2024 0835 Last data filed at 02/12/2024 0600 Gross per 24 hour  Intake 3511.66 ml  Output 950 ml  Net 2561.66 ml   Filed Weights   02/11/24 1002  Weight: 65 kg    Examination:  General exam: Appears calm and comfortable  Respiratory system: Clear to auscultation. Respiratory effort normal. Cardiovascular system: S1 & S2 heard, RRR. No JVD, murmurs, rubs, gallops or clicks. No pedal edema. Gastrointestinal system: Abdomen is nondistended, soft and nontender. No organomegaly or masses felt. Normal bowel sounds heard. Central nervous system: Alert and oriented. No focal neurological deficits. Extremities: Symmetric 5 x 5 power. Skin: No rashes, lesions or ulcers Psychiatry: Judgement and insight appear normal. Mood & affect appropriate.     Data Reviewed: I have personally reviewed following labs and imaging studies  CBC: Recent Labs  Lab 02/11/24 1004 02/12/24 0134  WBC 15.2* 13.6*  HGB 11.3* 13.2  HCT 34.0* 40.4  MCV 91.6 94.6  PLT 229 224   Basic Metabolic Panel: Recent Labs  Lab 02/11/24 1004 02/12/24 0134  NA 129* 134*  K 3.7 3.5  CL 99 101  CO2 23 23  GLUCOSE 162* 118*  BUN 22 19  CREATININE 0.98 0.95  CALCIUM 8.3* 8.5*   GFR:  Estimated Creatinine Clearance: 37.6 mL/min (by C-G formula based on SCr of 0.95 mg/dL). Liver Function Tests: Recent Labs  Lab 02/12/24 0134  AST 27  ALT 15  ALKPHOS 104  BILITOT 0.8  PROT 6.8  ALBUMIN 2.8*   No results for input(s): "LIPASE", "AMYLASE" in the last 168 hours. No results for input(s): "AMMONIA" in the last 168 hours. Coagulation Profile: No results for input(s): "INR", "PROTIME" in the last 168 hours. Cardiac Enzymes: No results for input(s): "CKTOTAL", "CKMB", "CKMBINDEX", "TROPONINI" in the last 168 hours. BNP (last 3 results) No results for input(s): "PROBNP" in  the last 8760 hours. HbA1C: No results for input(s): "HGBA1C" in the last 72 hours. CBG: Recent Labs  Lab 02/11/24 1316  GLUCAP 101*   Lipid Profile: No results for input(s): "CHOL", "HDL", "LDLCALC", "TRIG", "CHOLHDL", "LDLDIRECT" in the last 72 hours. Thyroid Function Tests: No results for input(s): "TSH", "T4TOTAL", "FREET4", "T3FREE", "THYROIDAB" in the last 72 hours. Anemia Panel: No results for input(s): "VITAMINB12", "FOLATE", "FERRITIN", "TIBC", "IRON", "RETICCTPCT" in the last 72 hours. Sepsis Labs: Recent Labs  Lab 02/11/24 1505 02/11/24 1707  LATICACIDVEN 1.5 1.2    Recent Results (from the past 240 hours)  Resp panel by RT-PCR (RSV, Flu A&B, Covid) Anterior Nasal Swab     Status: None   Collection Time: 02/11/24  3:05 PM   Specimen: Anterior Nasal Swab  Result Value Ref Range Status   SARS Coronavirus 2 by RT PCR NEGATIVE NEGATIVE Final    Comment: (NOTE) SARS-CoV-2 target nucleic acids are NOT DETECTED.  The SARS-CoV-2 RNA is generally detectable in upper respiratory specimens during the acute phase of infection. The lowest concentration of SARS-CoV-2 viral copies this assay can detect is 138 copies/mL. A negative result does not preclude SARS-Cov-2 infection and should not be used as the sole basis for treatment or other patient management decisions. A negative result may occur with  improper specimen collection/handling, submission of specimen other than nasopharyngeal swab, presence of viral mutation(s) within the areas targeted by this assay, and inadequate number of viral copies(<138 copies/mL). A negative result must be combined with clinical observations, patient history, and epidemiological information. The expected result is Negative.  Fact Sheet for Patients:  BloggerCourse.com  Fact Sheet for Healthcare Providers:  SeriousBroker.it  This test is no t yet approved or cleared by the [place]  FDA and  has been authorized for detection and/or diagnosis of SARS-CoV-2 by FDA under an Emergency Use Authorization (EUA). This EUA will remain  in effect (meaning this test can be used) for the duration of the COVID-19 declaration under Section 564(b)(1) of the Act, 21 U.S.C.section 360bbb-3(b)(1), unless the authorization is terminated  or revoked sooner.       Influenza A by PCR NEGATIVE NEGATIVE Final   Influenza B by PCR NEGATIVE NEGATIVE Final    Comment: (NOTE) The Xpert Xpress SARS-CoV-2/FLU/RSV plus assay is intended as an aid in the diagnosis of influenza from Nasopharyngeal swab specimens and should not be used as a sole basis for treatment. Nasal washings and aspirates are unacceptable for Xpert Xpress SARS-CoV-2/FLU/RSV testing.  Fact Sheet for Patients: BloggerCourse.com  Fact Sheet for Healthcare Providers: SeriousBroker.it  This test is not yet approved or cleared by the [place] FDA and has been authorized for detection and/or diagnosis of SARS-CoV-2 by FDA under an Emergency Use Authorization (EUA). This EUA will remain in effect (meaning this test can be used) for the duration of the COVID-19 declaration under Section 564(b)(1) of  the Act, 21 U.S.C. section 360bbb-3(b)(1), unless the authorization is terminated or revoked.     Resp Syncytial Virus by PCR NEGATIVE NEGATIVE Final    Comment: (NOTE) Fact Sheet for Patients: BloggerCourse.com  Fact Sheet for Healthcare Providers: SeriousBroker.it  This test is not yet approved or cleared by the [place] FDA and has been authorized for detection and/or diagnosis of SARS-CoV-2 by FDA under an Emergency Use Authorization (EUA). This EUA will remain in effect (meaning this test can be used) for the duration of the COVID-19 declaration under Section 564(b)(1) of the Act, 21 U.S.C. section  360bbb-3(b)(1), unless the authorization is terminated or revoked.  Performed at [facility], [address], [place], [place] [address]          Radiology Studies: DG Chest Portable 1 View Result Date: 02/11/2024 CLINICAL DATA:  Sepsis EXAM: PORTABLE CHEST 1 VIEW COMPARISON:  X-ray 05/26/2019.  CT chest 05/10/2020 FINDINGS: Slight elevation left hemidiaphragm, similar to previous. No pneumothorax, effusion or edema. Normal cardiopericardial silhouette calcified. No consolidation. Interstitial changes seen lungs which are chronic. Overlapping cardiac leads. IMPRESSION: Underinflation with slightly elevated left hemidiaphragm. Chronic lung changes. Electronically Signed   By: Karen Kays M.D.   On: 02/11/2024 17:07        Scheduled Meds:  Chlorhexidine Gluconate Cloth  6 each Topical Q0600   enoxaparin (LOVENOX) injection  40 mg Subcutaneous Q24H   ezetimibe  10 mg Oral Daily   levothyroxine  100 mcg Oral Daily   pantoprazole  40 mg Oral Daily   Continuous Infusions:  ceFEPime (MAXIPIME) IV 2 g (02/12/24 6045)   lactated ringers 150 mL/hr (02/12/24 0600)     LOS: 1 day       Charise Killian, MD [facility] Pager [phone]  If 7PM-7AM, please contact night-coverage [email or web address] 02/12/2024, 8:35 AM

## 2024-02-13 ENCOUNTER — Inpatient Hospital Stay

## 2024-02-13 DIAGNOSIS — N390 Urinary tract infection, site not specified: Secondary | ICD-10-CM | POA: Diagnosis not present

## 2024-02-13 LAB — BLOOD CULTURE ID PANEL (REFLEXED) - BCID2

## 2024-02-13 LAB — CBC
HCT: 33.3 % — ABNORMAL LOW (ref 36.0–46.0)
Hemoglobin: 10.7 g/dL — ABNORMAL LOW (ref 12.0–15.0)
MCH: 30.2 pg (ref 26.0–34.0)
MCHC: 32.1 g/dL (ref 30.0–36.0)
MCV: 94.1 fL (ref 80.0–100.0)
Platelets: 201 10*3/uL (ref 150–400)
RBC: 3.54 MIL/uL — ABNORMAL LOW (ref 3.87–5.11)
RDW: 14.2 % (ref 11.5–15.5)
WBC: 10.3 10*3/uL (ref 4.0–10.5)
nRBC: 0 % (ref 0.0–0.2)

## 2024-02-13 LAB — BASIC METABOLIC PANEL
Anion gap: 10 (ref 5–15)
BUN: 16 mg/dL (ref 8–23)
CO2: 26 mmol/L (ref 22–32)
Calcium: 8.5 mg/dL — ABNORMAL LOW (ref 8.9–10.3)
Chloride: 101 mmol/L (ref 98–111)
Creatinine, Ser: 0.91 mg/dL (ref 0.44–1.00)
GFR, Estimated: >60 mL/min (ref >60)
Glucose, Bld: 104 mg/dL — ABNORMAL HIGH (ref 70–99)
Potassium: 3.5 mmol/L (ref 3.5–5.1)
Sodium: 137 mmol/L (ref 135–145)

## 2024-02-13 MED ORDER — FLUCONAZOLE 50 MG PO TABS
150.0000 mg | ORAL_TABLET | Freq: Every day | ORAL | Status: AC
  Administered 2024-02-13 – 2024-02-14 (×2): 150 mg via ORAL
  Filled 2024-02-13 (×2): qty 1

## 2024-02-13 MED ORDER — ENSURE ENLIVE PO LIQD
237.0000 mL | Freq: Three times a day (TID) | ORAL | Status: DC
  Administered 2024-02-13 – 2024-02-16 (×5): 237 mL via ORAL

## 2024-02-13 NOTE — Progress Notes (Signed)
 Occupational Therapy Treatment Patient Details Name: Lauren Hammond MRN: [id number] DOB: 1932/01/21 Today's Date: 02/13/2024   History of present illness presented to ER from home secondary to progressive weakness, general malaise; admited for mangement of sepsis related to UTI.  Of note, recent R distal radial fracture, pelvic fracture and pelvic hematoma s/p fall; WBAT bilat UE/LE per previous records   OT comments  Ms Paquette was seen for OT treatment on this date. Upon arrival to room pt in bed, agreeable to tx. Pt requires SUPERVISION for bed mobility. MAX A don B socks in sitting. MIN A + RW for bed>chair pivot t/f, defers further mobility citing fatigue. Educated family on fall prevention and importance of initial 24/7 supervision if plan to d/c home. Pt making good progress toward goals, will continue to follow POC. Discharge recommendation remains appropriate.        If plan is discharge home, recommend the following:  A lot of help with walking and/or transfers;A lot of help with bathing/dressing/bathroom;Assistance with cooking/housework;Assist for transportation;Help with stairs or ramp for entrance   Equipment Recommendations  BSC/3in1    Recommendations for Other Services      Precautions / Restrictions Precautions Precautions: Fall Recall of Precautions/Restrictions: Intact Restrictions Weight Bearing Restrictions Per Provider Order: Yes LUE Weight Bearing Per Provider Order: Weight bearing as tolerated RLE Weight Bearing Per Provider Order: Weight bearing as tolerated LLE Weight Bearing Per Provider Order: Weight bearing as tolerated       Mobility Bed Mobility Overal bed mobility: Needs Assistance Bed Mobility: Supine to Sit     Supine to sit: Contact guard          Transfers Overall transfer level: Needs assistance Equipment used: Rolling walker (2 wheels) Transfers: Sit to/from Stand Sit to Stand: Min assist     Step pivot transfers: Contact guard  assist           Balance Overall balance assessment: Needs assistance Sitting-balance support: No upper extremity supported, Feet supported Sitting balance-Leahy Scale: Good     Standing balance support: Bilateral upper extremity supported Standing balance-Leahy Scale: Fair                             ADL either performed or assessed with clinical judgement   ADL Overall ADL's : Needs assistance/impaired                                       General ADL Comments: MAX A don B socks in sitting. MIN A + RW for simulated BSC t/f     Communication Communication Communication: No apparent difficulties   Cognition Arousal: Alert Behavior During Therapy: WFL for tasks assessed/performed Cognition: No apparent impairments                               Following commands: Intact                      Pertinent Vitals/ Pain       Pain Assessment Pain Assessment: Faces Faces Pain Scale: Hurts little more Pain Location: hip Pain Descriptors / Indicators: Discomfort Pain Intervention(s): Limited activity within patient's tolerance   Frequency  Min 2X/week        Progress Toward Goals  OT Goals(current goals can now be found  in the care plan section)  Progress towards OT goals: Progressing toward goals  Acute Rehab OT Goals OT Goal Formulation: With patient Time For Goal Achievement: 02/26/24 Potential to Achieve Goals: Fair ADL Goals Pt Will Perform Grooming: with modified independence;standing Pt Will Perform Lower Body Dressing: with supervision;sit to/from stand Pt Will Transfer to Toilet: with supervision;ambulating Pt Will Perform Toileting - Clothing Manipulation and hygiene: with supervision;sit to/from stand  Plan      Co-evaluation                 AM-PAC OT "6 Clicks" Daily Activity     Outcome Measure   Help from another person eating meals?: None Help from another person taking care of  personal grooming?: A Little Help from another person toileting, which includes using toliet, bedpan, or urinal?: A Lot Help from another person bathing (including washing, rinsing, drying)?: A Lot Help from another person to put on and taking off regular upper body clothing?: A Little Help from another person to put on and taking off regular lower body clothing?: A Lot 6 Click Score: 16    End of Session Equipment Utilized During Treatment: Rolling walker (2 wheels)  OT Visit Diagnosis: Unsteadiness on feet (R26.81);Repeated falls (R29.6);Muscle weakness (generalized) (M62.81);History of falling (Z91.81)   Activity Tolerance Patient limited by fatigue   Patient Left in chair;with call bell/phone within reach;with chair alarm set;with family/visitor present   Nurse Communication          Time: 1610-9604 OT Time Calculation (min): 11 min  Charges: OT General Charges $OT Visit: 1 Visit OT Treatments $Self Care/Home Management : 8-22 mins  Kathie Dike, M.S. OTR/L  02/13/24, 1:06 PM  ascom [phone]

## 2024-02-13 NOTE — TOC Progression Note (Signed)
 Transition of Care [facility]) - Progression Note    Patient Details  Name: Lauren Hammond MRN: [id number] Date of Birth: November 07, 1932  [facility] CM/SW Contact  Marlowe Sax, RN Phone Number: 02/13/2024, 12:16 PM  Clinical Narrative:     Sherron Monday with Lamar Laundry the daughter and she stated that she is set up with [facility] for home health, a rolling walker was requested  by [facility]       Expected Discharge Plan and Services                                               Social Determinants of Health (SDOH) Interventions SDOH Screenings   Food Insecurity: No Food Insecurity (02/11/2024)  Housing: Unknown (02/11/2024)  Transportation Needs: No Transportation Needs (02/11/2024)  Utilities: Not At Risk (02/11/2024)  Financial Resource Strain: Low Risk  (01/15/2024)   Received from [facility]  Social Connections: Unknown (02/11/2024)  Tobacco Use: Low Risk  (02/11/2024)  Health Literacy: Medium Risk (01/31/2024)   Received from [facility]    Readmission Risk Interventions     No data to display

## 2024-02-13 NOTE — Progress Notes (Signed)
 PROGRESS NOTE    Lauren Hammond  CWC:[id number] DOB: 28-Jan-1932 DOA: 02/11/2024 PCP: Barbette Reichmann, MD   Assessment & Plan:   Principal Problem:   UTI (urinary tract infection) Active Problems:   Sepsis (HCC)   Atrial fibrillation (HCC)   History of fall   Clinical depression   HLD (hyperlipidemia)   Adult hypothyroidism   COPD (chronic obstructive pulmonary disease) (HCC)  Assessment and Plan: Sepsis: met criteria w/ tachycardia, tachypnea & likely secondary to catheter associated UTI. Continue on IV abxs. Sepsis resolved  Catheter associated UTI: continue on IV cefepime, urine cx is growing gram neg rods, sens pending. Catheter exchanged in the ER   Urinary retention: s/p catheter exchange in the ER. Continue w/ supportive care. Will f/u outpatient w/ uro    Pelvic fracture:w/ recent pelvic hematoma. Likely secondary to fall at home. Noted for this on admission at [facility]. PT recs HH. OT recs SNF   L distal radius fracture: likely secondary to fall at home. Noted for this on admission at [facility]. PT recs HH. OT recs SNF   Likely PAF: not on rate controlling meds as per med rec. Continue on home dose of xarelto. Monitor for signs/symptoms of bleeding w/ recent hx of pelvic hematoma   COPD: w/o exacerbation. Bronchodilators prn    Hypothyroidism: continue on home dose of synthroid    HLD: continue on home dose of zetia   Depression: severity unknown. Continue on home dose of sertraline          DVT prophylaxis: xarelto  Code Status: DNR Family Communication: discussed pt's care w/ pt's daughter and answered her questions Disposition Plan: likely d/c home w/ HH  Level of care: Telemetry Medical  Status is: Inpatient Remains inpatient appropriate because: severity of illness, waiting on urine cx results    Consultants:    Procedures:   Antimicrobials:   Subjective: Pt c/o malaise  Objective: Vitals:   02/12/24 0805 02/12/24 1201 02/12/24 2031 02/13/24  0411  BP: (!) 136/103  124/65 134/80  Pulse: 99  92 84  Resp: 16  16 20   Temp: (!) 97.4 F (36.3 C) 98.3 F (36.8 C) 98.4 F (36.9 C) (!) 97.5 F (36.4 C)  TempSrc:  Oral Oral Oral  SpO2: 91%  94% 98%  Weight:      Height:        Intake/Output Summary (Last 24 hours) at 02/13/2024 0833 Last data filed at 02/13/2024 0457 Gross per 24 hour  Intake 1375.55 ml  Output 1675 ml  Net -299.45 ml   Filed Weights   02/11/24 1002  Weight: 65 kg    Examination:  General exam: Appears comfortable  Respiratory system: clear breath sounds b/l  Cardiovascular system: S1/S2+. No rubs, gallops or clicks.  Gastrointestinal system: Abdomen is nondistended, soft and nontender. Normal bowel sounds heard. Central nervous system: alert & oriented. Moves all extremities  Psychiatry: Judgement and insight appears at baseline. Flat mood and affect    Data Reviewed: I have personally reviewed following labs and imaging studies  CBC: Recent Labs  Lab 02/11/24 1004 02/12/24 0134 02/13/24 0447  WBC 15.2* 13.6* 10.3  HGB 11.3* 13.2 10.7*  HCT 34.0* 40.4 33.3*  MCV 91.6 94.6 94.1  PLT 229 224 201   Basic Metabolic Panel: Recent Labs  Lab 02/11/24 1004 02/12/24 0134 02/13/24 0447  NA 129* 134* 137  K 3.7 3.5 3.5  CL 99 101 101  CO2 23 23 26   GLUCOSE 162* 118* 104*  BUN 22 19 16   CREATININE 0.98 0.95 0.91  CALCIUM 8.3* 8.5* 8.5*   GFR: Estimated Creatinine Clearance: 39.2 mL/min (by C-G formula based on SCr of 0.91 mg/dL). Liver Function Tests: Recent Labs  Lab 02/12/24 0134  AST 27  ALT 15  ALKPHOS 104  BILITOT 0.8  PROT 6.8  ALBUMIN 2.8*   No results for input(s): "LIPASE", "AMYLASE" in the last 168 hours. No results for input(s): "AMMONIA" in the last 168 hours. Coagulation Profile: No results for input(s): "INR", "PROTIME" in the last 168 hours. Cardiac Enzymes: No results for input(s): "CKTOTAL", "CKMB", "CKMBINDEX", "TROPONINI" in the last 168 hours. BNP (last 3  results) No results for input(s): "PROBNP" in the last 8760 hours. HbA1C: No results for input(s): "HGBA1C" in the last 72 hours. CBG: Recent Labs  Lab 02/11/24 1316  GLUCAP 101*   Lipid Profile: No results for input(s): "CHOL", "HDL", "LDLCALC", "TRIG", "CHOLHDL", "LDLDIRECT" in the last 72 hours. Thyroid Function Tests: No results for input(s): "TSH", "T4TOTAL", "FREET4", "T3FREE", "THYROIDAB" in the last 72 hours. Anemia Panel: No results for input(s): "VITAMINB12", "FOLATE", "FERRITIN", "TIBC", "IRON", "RETICCTPCT" in the last 72 hours. Sepsis Labs: Recent Labs  Lab 02/11/24 1505 02/11/24 1707  LATICACIDVEN 1.5 1.2    Recent Results (from the past 240 hours)  Urine Culture     Status: Abnormal (Preliminary result)   Collection Time: 02/11/24  1:18 PM   Specimen: Urine, Catheterized  Result Value Ref Range Status   Specimen Description   Final    URINE, CATHETERIZED Performed at [facility], [address] [place], [place] [address]    Special Requests   Final    NONE Performed at [facility], [address], [place], [place] [address]    Culture (A)  Final    >=100,000 COLONIES/mL GRAM NEGATIVE RODS 90,000 COLONIES/mL GRAM NEGATIVE RODS IDENTIFICATION AND SUSCEPTIBILITIES TO FOLLOW CULTURE REINCUBATED FOR BETTER GROWTH Performed at [facility], [address] [place], [place] [address]    Report Status PENDING  Incomplete  Resp panel by RT-PCR (RSV, Flu A&B, Covid) Anterior Nasal Swab     Status: None   Collection Time: 02/11/24  3:05 PM   Specimen: Anterior Nasal Swab  Result Value Ref Range Status   SARS Coronavirus 2 by RT PCR NEGATIVE NEGATIVE Final    Comment: (NOTE) SARS-CoV-2 target nucleic acids are NOT DETECTED.  The SARS-CoV-2 RNA is generally detectable in upper respiratory specimens during the acute phase of infection. The lowest concentration of SARS-CoV-2 viral copies this assay can detect is 138 copies/mL. A  negative result does not preclude SARS-Cov-2 infection and should not be used as the sole basis for treatment or other patient management decisions. A negative result may occur with  improper specimen collection/handling, submission of specimen other than nasopharyngeal swab, presence of viral mutation(s) within the areas targeted by this assay, and inadequate number of viral copies(<138 copies/mL). A negative result must be combined with clinical observations, patient history, and epidemiological information. The expected result is Negative.  Fact Sheet for Patients:  BloggerCourse.com  Fact Sheet for Healthcare Providers:  SeriousBroker.it  This test is no t yet approved or cleared by the [place] FDA and  has been authorized for detection and/or diagnosis of SARS-CoV-2 by FDA under an Emergency Use Authorization (EUA). This EUA will remain  in effect (meaning this test can be used) for the duration of the COVID-19 declaration under Section 564(b)(1) of the Act, 21 U.S.C.section 360bbb-3(b)(1),  unless the authorization is terminated  or revoked sooner.       Influenza A by PCR NEGATIVE NEGATIVE Final   Influenza B by PCR NEGATIVE NEGATIVE Final    Comment: (NOTE) The Xpert Xpress SARS-CoV-2/FLU/RSV plus assay is intended as an aid in the diagnosis of influenza from Nasopharyngeal swab specimens and should not be used as a sole basis for treatment. Nasal washings and aspirates are unacceptable for Xpert Xpress SARS-CoV-2/FLU/RSV testing.  Fact Sheet for Patients: BloggerCourse.com  Fact Sheet for Healthcare Providers: SeriousBroker.it  This test is not yet approved or cleared by the [place] FDA and has been authorized for detection and/or diagnosis of SARS-CoV-2 by FDA under an Emergency Use Authorization (EUA). This EUA will remain in effect (meaning this test can  be used) for the duration of the COVID-19 declaration under Section 564(b)(1) of the Act, 21 U.S.C. section 360bbb-3(b)(1), unless the authorization is terminated or revoked.     Resp Syncytial Virus by PCR NEGATIVE NEGATIVE Final    Comment: (NOTE) Fact Sheet for Patients: BloggerCourse.com  Fact Sheet for Healthcare Providers: SeriousBroker.it  This test is not yet approved or cleared by the [place] FDA and has been authorized for detection and/or diagnosis of SARS-CoV-2 by FDA under an Emergency Use Authorization (EUA). This EUA will remain in effect (meaning this test can be used) for the duration of the COVID-19 declaration under Section 564(b)(1) of the Act, 21 U.S.C. section 360bbb-3(b)(1), unless the authorization is terminated or revoked.  Performed at [facility], [address] [place], [place] [address]   Blood culture (routine x 2)     Status: None (Preliminary result)   Collection Time: 02/11/24  3:32 PM   Specimen: BLOOD  Result Value Ref Range Status   Specimen Description BLOOD BLOOD RIGHT ARM  Final   Special Requests   Final    BOTTLES DRAWN AEROBIC AND ANAEROBIC Blood Culture adequate volume   Culture   Final    NO GROWTH 2 DAYS Performed at [facility], [address], [place], [place] [address]    Report Status PENDING  Incomplete  Blood culture (routine x 2)     Status: None (Preliminary result)   Collection Time: 02/11/24  3:35 PM   Specimen: BLOOD  Result Value Ref Range Status   Specimen Description BLOOD RIGHT ANTECUBITAL  Final   Special Requests   Final    BOTTLES DRAWN AEROBIC AND ANAEROBIC Blood Culture results may not be optimal due to an inadequate volume of blood received in culture bottles   Culture   Final    NO GROWTH 2 DAYS Performed at [facility], [address] [place], [place] [address]    Report Status PENDING  Incomplete          Radiology Studies: DG Chest Portable 1 View Result Date: 02/11/2024 CLINICAL DATA:  Sepsis EXAM: PORTABLE CHEST 1 VIEW COMPARISON:  X-ray 05/26/2019.  CT chest 05/10/2020 FINDINGS: Slight elevation left hemidiaphragm, similar to previous. No pneumothorax, effusion or edema. Normal cardiopericardial silhouette calcified. No consolidation. Interstitial changes seen lungs which are chronic. Overlapping cardiac leads. IMPRESSION: Underinflation with slightly elevated left hemidiaphragm. Chronic lung changes. Electronically Signed   By: Karen Kays M.D.   On: 02/11/2024 17:07        Scheduled Meds:  Chlorhexidine Gluconate Cloth  6 each Topical Q0600   ezetimibe  10 mg Oral Daily   levothyroxine  100 mcg Oral Daily   pantoprazole  40  mg Oral Daily   Rivaroxaban  15 mg Oral Q supper   sertraline  100 mg Oral Daily   Continuous Infusions:  ceFEPime (MAXIPIME) IV 2 g (02/13/24 0602)     LOS: 2 days       Charise Killian, MD [facility] Pager [phone]  If 7PM-7AM, please contact night-coverage [email or web address] 02/13/2024, 8:33 AM

## 2024-02-13 NOTE — Progress Notes (Signed)
 Physical Therapy Treatment Patient Details Name: Lauren Hammond MRN: [id number] DOB: [birth year] Today's Date: 02/13/2024   History of Present Illness presented to ER from home secondary to progressive weakness, general malaise; admited for mangement of sepsis related to UTI.  Of note, recent R distal radial fracture, pelvic fracture and pelvic hematoma s/p fall; WBAT bilat UE/LE per previous records    PT Comments  Pt received up in chair, daughter at bedside. Discussed role of PT, pt's baseline LOF, and home set up for safe transition home possibly tomorrow. RW delivered to room and adjusted to proper height. Pt agreed to PT session. MinA to raise from chair to RW and gain upright standing balance prior to completing gait training in hall with CGA for safety ~18ft. Good tolerance for distance without LOB or unsteadiness. Pt does endorse increased L hip discomfort during wt bearing activities. Will continue to progress acutely. Pt plans to return home with HHPT and intermittent assist from daughter.    If plan is discharge home, recommend the following: A little help with walking and/or transfers;A little help with bathing/dressing/bathroom   Can travel by private vehicle        Equipment Recommendations  Rolling walker (2 wheels)    Recommendations for Other Services       Precautions / Restrictions Precautions Precautions: Fall Recall of Precautions/Restrictions: Intact Restrictions Weight Bearing Restrictions Per Provider Order: Yes LUE Weight Bearing Per Provider Order: Weight bearing as tolerated RLE Weight Bearing Per Provider Order: Weight bearing as tolerated LLE Weight Bearing Per Provider Order: Weight bearing as tolerated Other Position/Activity Restrictions: WBAT pelvic fx and L radial fx with soft brace     Mobility  Bed Mobility               General bed mobility comments: Pt in chair pre/post session    Transfers Overall transfer level: Needs  assistance Equipment used: Rolling walker (2 wheels) Transfers: Sit to/from Stand Sit to Stand: Min assist           General transfer comment: does require UE support to initiate/complete; min assist for lift off and stabilization.  Mild sway in A/P plane, but does self-correct with increased time/attention    Ambulation/Gait Ambulation/Gait assistance: Contact guard assist Gait Distance (Feet): 160 Feet Assistive device: Rolling walker (2 wheels) Gait Pattern/deviations: Step-through pattern Gait velocity: decr     General Gait Details: No LOB with changes in direction while ambulating, no buckling or dizziness. Pt does endorse L hip pain with wt bearing activities.   Stairs Stairs:  (No stairs to enter home)           Wheelchair Mobility     Tilt Bed    Modified Rankin (Stroke Patients Only)       Balance Overall balance assessment: Needs assistance Sitting-balance support: No upper extremity supported, Feet supported Sitting balance-Leahy Scale: Good     Standing balance support: Bilateral upper extremity supported, During functional activity, Reliant on assistive device for balance Standing balance-Leahy Scale: Fair                              Hotel manager: No apparent difficulties  Cognition Arousal: Alert Behavior During Therapy: WFL for tasks assessed/performed   PT - Cognitive impairments: No apparent impairments                         Following commands: Intact  Cueing    Exercises General Exercises - Lower Extremity Ankle Circles/Pumps: AROM, Both, 10 reps, Seated Long Arc Quad: AROM, Both, 10 reps, Seated Hip Flexion/Marching: AROM, Both, 5 reps (modified in chair due to L hip discomfort)    General Comments General comments (skin integrity, edema, etc.): Urethral cath intact, will d/c home with this      Pertinent Vitals/Pain Pain Assessment Pain Assessment: Faces Faces  Pain Scale: Hurts little more Pain Location: Left hip Pain Descriptors / Indicators: Discomfort Pain Intervention(s): Monitored during session    Home Living                          Prior Function            PT Goals (current goals can now be found in the care plan section) Acute Rehab PT Goals Patient Stated Goal: to get stronger and get back home Progress towards PT goals: Progressing toward goals    Frequency    Min 3X/week      PT Plan      Co-evaluation              AM-PAC PT "6 Clicks" Mobility   Outcome Measure  Help needed turning from your back to your side while in a flat bed without using bedrails?: None Help needed moving from lying on your back to sitting on the side of a flat bed without using bedrails?: A Little Help needed moving to and from a bed to a chair (including a wheelchair)?: A Little Help needed standing up from a chair using your arms (e.g., wheelchair or bedside chair)?: A Little Help needed to walk in hospital room?: A Little Help needed climbing 3-5 steps with a railing? : A Lot 6 Click Score: 18    End of Session Equipment Utilized During Treatment: Gait belt Activity Tolerance: Patient tolerated treatment well Patient left: in chair;with call bell/phone within reach;with chair alarm set;with family/visitor present Nurse Communication: Mobility status PT Visit Diagnosis: Muscle weakness (generalized) (M62.81);Difficulty in walking, not elsewhere classified (R26.2)     Time: 8295-6213 PT Time Calculation (min) (ACUTE ONLY): 26 min  Charges:    $Gait Training: 8-22 mins $Therapeutic Activity: 8-22 mins PT General Charges $$ ACUTE PT VISIT: 1 Visit                    Zadie Cleverly, PTA  Jannet Askew 02/13/2024, 3:54 PM

## 2024-02-13 NOTE — Plan of Care (Signed)

## 2024-02-13 NOTE — Plan of Care (Signed)
  Problem: Clinical Measurements: Goal: Diagnostic test results will improve Outcome: Progressing   Problem: Health Behavior/Discharge Planning: Goal: Ability to manage health-related needs will improve Outcome: Progressing   Problem: Clinical Measurements: Goal: Ability to maintain clinical measurements within normal limits will improve Outcome: Progressing Goal: Diagnostic test results will improve Outcome: Progressing   Problem: Activity: Goal: Risk for activity intolerance will decrease Outcome: Progressing   Problem: Nutrition: Goal: Adequate nutrition will be maintained Outcome: Progressing   Problem: Coping: Goal: Level of anxiety will decrease Outcome: Progressing   Problem: Pain Managment: Goal: General experience of comfort will improve and/or be controlled Outcome: Progressing   Problem: Safety: Goal: Ability to remain free from injury will improve Outcome: Progressing

## 2024-02-14 DIAGNOSIS — N390 Urinary tract infection, site not specified: Secondary | ICD-10-CM | POA: Diagnosis not present

## 2024-02-14 LAB — BASIC METABOLIC PANEL
Anion gap: 8 (ref 5–15)
BUN: 20 mg/dL (ref 8–23)
CO2: 26 mmol/L (ref 22–32)
Calcium: 8.3 mg/dL — ABNORMAL LOW (ref 8.9–10.3)
Chloride: 104 mmol/L (ref 98–111)
Creatinine, Ser: 0.83 mg/dL (ref 0.44–1.00)
GFR, Estimated: >60 mL/min (ref >60)
Glucose, Bld: 91 mg/dL (ref 70–99)
Potassium: 3.2 mmol/L — ABNORMAL LOW (ref 3.5–5.1)
Sodium: 138 mmol/L (ref 135–145)

## 2024-02-14 LAB — CBC
HCT: 31.9 % — ABNORMAL LOW (ref 36.0–46.0)
Hemoglobin: 10.6 g/dL — ABNORMAL LOW (ref 12.0–15.0)
MCH: 30.2 pg (ref 26.0–34.0)
MCHC: 33.2 g/dL (ref 30.0–36.0)
MCV: 90.9 fL (ref 80.0–100.0)
Platelets: 224 10*3/uL (ref 150–400)
RBC: 3.51 MIL/uL — ABNORMAL LOW (ref 3.87–5.11)
RDW: 14.4 % (ref 11.5–15.5)
WBC: 8.7 10*3/uL (ref 4.0–10.5)
nRBC: 0 % (ref 0.0–0.2)

## 2024-02-14 MED ORDER — POTASSIUM CHLORIDE CRYS ER 20 MEQ PO TBCR
40.0000 meq | EXTENDED_RELEASE_TABLET | Freq: Once | ORAL | Status: AC
  Administered 2024-02-14: 40 meq via ORAL
  Filled 2024-02-14: qty 2

## 2024-02-14 MED ORDER — SODIUM CHLORIDE 0.9 % IV SOLN
2.0000 g | INTRAVENOUS | Status: DC
  Administered 2024-02-14 – 2024-02-16 (×3): 2 g via INTRAVENOUS
  Filled 2024-02-14 (×3): qty 20

## 2024-02-14 NOTE — Plan of Care (Signed)

## 2024-02-14 NOTE — Care Management Important Message (Signed)
 Important Message  Patient Details  Name: Lauren Hammond MRN: [id number] Date of Birth: [birth year]   Important Message Given:  Yes - Medicare IM     Cristela Blue, CMA 02/14/2024, 11:26 AM

## 2024-02-14 NOTE — Progress Notes (Signed)
 PROGRESS NOTE    Lauren Hammond  ZOX:[id number] DOB: Aug 25, 1932 DOA: 02/11/2024 PCP: Barbette Reichmann, MD   Assessment & Plan:   Principal Problem:   UTI (urinary tract infection) Active Problems:   Sepsis (HCC)   Atrial fibrillation (HCC)   History of fall   Clinical depression   HLD (hyperlipidemia)   Adult hypothyroidism   COPD (chronic obstructive pulmonary disease) (HCC)  Assessment and Plan: Sepsis: met criteria w/ tachycardia, tachypnea & likely secondary to catheter associated UTI. Continue on IV abxs. Sepsis resolved  Catheter associated UTI: continue on IV cefepime. Urine cx is growing e.coli as well as 1 other bacteria which is pending. Catheter exchanged in ER. Catheter was initially placed at [facility] so present on admission   Urinary retention: s/p catheter exchange in the ER. Continue w/ supportive care. Will f/u outpatient w/ uro    Pelvic fracture:w/ recent pelvic hematoma. Likely secondary to fall at home. Noted for this on admission at [facility]. PT recs HH. OT recs SNF   L distal radius fracture: likely secondary to fall at home. Noted for this on admission at [facility]. PT recs HH. OT recs SNF   Likely PAF: not on rate controlling meds as per med rec. Continue on home dose of xarelto. Monitor for signs/symptoms of bleeding w/ recent hx of pelvic hematoma  Hypokalemia: potassium given    COPD: w/o exacerbation. Bronchodilators prn    Hypothyroidism: continue on home dose of levothyroxine    HLD: continue on home dose of zetia   Depression: severity unknown. Continue on home dose of sertraline          DVT prophylaxis: xarelto  Code Status: DNR Family Communication: called pt's daughter, Lamar Laundry, no answer so I left a voicemail Disposition Plan: likely d/c home w/ HH  Level of care: Telemetry Medical  Status is: Inpatient Remains inpatient appropriate because: severity of illness, still waiting on urine cx results     Consultants:    Procedures:    Antimicrobials:   Subjective: Pt c/o fatigue but improved from day prior   Objective: Vitals:   02/13/24 1543 02/13/24 1953 02/14/24 0309 02/14/24 0738  BP: 112/72 129/63 119/64 127/86  Pulse: 75 77 74 82  Resp: 16 16 18 15   Temp: 98.3 F (36.8 C) 97.8 F (36.6 C)  97.6 F (36.4 C)  TempSrc: Oral Oral    SpO2: 96% 96% 97% 95%  Weight:      Height:        Intake/Output Summary (Last 24 hours) at 02/14/2024 0841 Last data filed at 02/14/2024 0656 Gross per 24 hour  Intake 300 ml  Output --  Net 300 ml   Filed Weights   02/11/24 1002  Weight: 65 kg    Examination:  General exam: Appears calm & comfortable  Respiratory system: clear breath sounds b/l  Cardiovascular system: S1 & S2+. No rubs or clicks  Gastrointestinal system: abd is soft, NT, ND & normal bowel sounds Central nervous system: alert & oriented. Moves all extremities  Psychiatry: judgement and insight appears at baseline. Flat mood and affect     Data Reviewed: I have personally reviewed following labs and imaging studies  CBC: Recent Labs  Lab 02/11/24 1004 02/12/24 0134 02/13/24 0447 02/14/24 0336  WBC 15.2* 13.6* 10.3 8.7  HGB 11.3* 13.2 10.7* 10.6*  HCT 34.0* 40.4 33.3* 31.9*  MCV 91.6 94.6 94.1 90.9  PLT 229 224 201 224   Basic Metabolic Panel: Recent Labs  Lab 02/11/24  1004 02/12/24 0134 02/13/24 0447 02/14/24 0336  NA 129* 134* 137 138  K 3.7 3.5 3.5 3.2*  CL 99 101 101 104  CO2 23 23 26 26   GLUCOSE 162* 118* 104* 91  BUN 22 19 16 20   CREATININE 0.98 0.95 0.91 0.83  CALCIUM 8.3* 8.5* 8.5* 8.3*   GFR: Estimated Creatinine Clearance: 43 mL/min (by C-G formula based on SCr of 0.83 mg/dL). Liver Function Tests: Recent Labs  Lab 02/12/24 0134  AST 27  ALT 15  ALKPHOS 104  BILITOT 0.8  PROT 6.8  ALBUMIN 2.8*   No results for input(s): "LIPASE", "AMYLASE" in the last 168 hours. No results for input(s): "AMMONIA" in the last 168 hours. Coagulation Profile: No  results for input(s): "INR", "PROTIME" in the last 168 hours. Cardiac Enzymes: No results for input(s): "CKTOTAL", "CKMB", "CKMBINDEX", "TROPONINI" in the last 168 hours. BNP (last 3 results) No results for input(s): "PROBNP" in the last 8760 hours. HbA1C: No results for input(s): "HGBA1C" in the last 72 hours. CBG: Recent Labs  Lab 02/11/24 1316  GLUCAP 101*   Lipid Profile: No results for input(s): "CHOL", "HDL", "LDLCALC", "TRIG", "CHOLHDL", "LDLDIRECT" in the last 72 hours. Thyroid Function Tests: No results for input(s): "TSH", "T4TOTAL", "FREET4", "T3FREE", "THYROIDAB" in the last 72 hours. Anemia Panel: No results for input(s): "VITAMINB12", "FOLATE", "FERRITIN", "TIBC", "IRON", "RETICCTPCT" in the last 72 hours. Sepsis Labs: Recent Labs  Lab 02/11/24 1505 02/11/24 1707  LATICACIDVEN 1.5 1.2    Recent Results (from the past 240 hours)  Urine Culture     Status: Abnormal (Preliminary result)   Collection Time: 02/11/24  1:18 PM   Specimen: Urine, Catheterized  Result Value Ref Range Status   Specimen Description   Final    URINE, CATHETERIZED Performed at [facility], [address], [place], [place] [address]    Special Requests   Final    NONE Performed at [facility], [address] [place], [place] [address]    Culture (A)  Final    >=100,000 COLONIES/mL ESCHERICHIA COLI 90,000 COLONIES/mL GRAM NEGATIVE RODS CULTURE REINCUBATED FOR BETTER GROWTH Performed at [facility], [address] [place], [place] [address]    Report Status PENDING  Incomplete   Organism ID, Bacteria ESCHERICHIA COLI (A)  Final      Susceptibility   Escherichia coli - MIC*    AMPICILLIN <=2 SENSITIVE Sensitive     CEFAZOLIN <=4 SENSITIVE Sensitive     CEFEPIME <=0.12 SENSITIVE Sensitive     CEFTRIAXONE <=0.25 SENSITIVE Sensitive     CIPROFLOXACIN <=0.25 SENSITIVE Sensitive     GENTAMICIN <=1 SENSITIVE Sensitive     IMIPENEM <=0.25 SENSITIVE  Sensitive     NITROFURANTOIN <=16 SENSITIVE Sensitive     TRIMETH/SULFA <=20 SENSITIVE Sensitive     AMPICILLIN/SULBACTAM <=2 SENSITIVE Sensitive     PIP/TAZO <=4 SENSITIVE Sensitive ug/mL    * >=100,000 COLONIES/mL ESCHERICHIA COLI  Resp panel by RT-PCR (RSV, Flu A&B, Covid) Anterior Nasal Swab     Status: None   Collection Time: 02/11/24  3:05 PM   Specimen: Anterior Nasal Swab  Result Value Ref Range Status   SARS Coronavirus 2 by RT PCR NEGATIVE NEGATIVE Final    Comment: (NOTE) SARS-CoV-2 target nucleic acids are NOT DETECTED.  The SARS-CoV-2 RNA is generally detectable in upper respiratory specimens during the acute phase of infection. The lowest concentration of SARS-CoV-2 viral copies this assay can detect is 138 copies/mL. A negative result  does not preclude SARS-Cov-2 infection and should not be used as the sole basis for treatment or other patient management decisions. A negative result may occur with  improper specimen collection/handling, submission of specimen other than nasopharyngeal swab, presence of viral mutation(s) within the areas targeted by this assay, and inadequate number of viral copies(<138 copies/mL). A negative result must be combined with clinical observations, patient history, and epidemiological information. The expected result is Negative.  Fact Sheet for Patients:  BloggerCourse.com  Fact Sheet for Healthcare Providers:  SeriousBroker.it  This test is no t yet approved or cleared by the [place] FDA and  has been authorized for detection and/or diagnosis of SARS-CoV-2 by FDA under an Emergency Use Authorization (EUA). This EUA will remain  in effect (meaning this test can be used) for the duration of the COVID-19 declaration under Section 564(b)(1) of the Act, 21 U.S.C.section 360bbb-3(b)(1), unless the authorization is terminated  or revoked sooner.       Influenza A by PCR NEGATIVE  NEGATIVE Final   Influenza B by PCR NEGATIVE NEGATIVE Final    Comment: (NOTE) The Xpert Xpress SARS-CoV-2/FLU/RSV plus assay is intended as an aid in the diagnosis of influenza from Nasopharyngeal swab specimens and should not be used as a sole basis for treatment. Nasal washings and aspirates are unacceptable for Xpert Xpress SARS-CoV-2/FLU/RSV testing.  Fact Sheet for Patients: BloggerCourse.com  Fact Sheet for Healthcare Providers: SeriousBroker.it  This test is not yet approved or cleared by the [place] FDA and has been authorized for detection and/or diagnosis of SARS-CoV-2 by FDA under an Emergency Use Authorization (EUA). This EUA will remain in effect (meaning this test can be used) for the duration of the COVID-19 declaration under Section 564(b)(1) of the Act, 21 U.S.C. section 360bbb-3(b)(1), unless the authorization is terminated or revoked.     Resp Syncytial Virus by PCR NEGATIVE NEGATIVE Final    Comment: (NOTE) Fact Sheet for Patients: BloggerCourse.com  Fact Sheet for Healthcare Providers: SeriousBroker.it  This test is not yet approved or cleared by the [place] FDA and has been authorized for detection and/or diagnosis of SARS-CoV-2 by FDA under an Emergency Use Authorization (EUA). This EUA will remain in effect (meaning this test can be used) for the duration of the COVID-19 declaration under Section 564(b)(1) of the Act, 21 U.S.C. section 360bbb-3(b)(1), unless the authorization is terminated or revoked.  Performed at [facility], [address] [place], [place] [address]   Blood culture (routine x 2)     Status: None (Preliminary result)   Collection Time: 02/11/24  3:32 PM   Specimen: BLOOD  Result Value Ref Range Status   Specimen Description BLOOD BLOOD RIGHT ARM  Final   Special Requests   Final    BOTTLES DRAWN AEROBIC  AND ANAEROBIC Blood Culture adequate volume   Culture   Final    NO GROWTH 3 DAYS Performed at [facility], [address] [place], [place] [address]    Report Status PENDING  Incomplete  Blood culture (routine x 2)     Status: None (Preliminary result)   Collection Time: 02/11/24  3:35 PM   Specimen: BLOOD  Result Value Ref Range Status   Specimen Description BLOOD RIGHT ANTECUBITAL  Final   Special Requests   Final    BOTTLES DRAWN AEROBIC AND ANAEROBIC Blood Culture results may not be optimal due to an inadequate volume of blood received in culture bottles   Culture  Setup Time  Final    Organism ID to follow GRAM NEGATIVE RODS GRAM POSITIVE RODS AEROBIC BOTTLE ONLY CRITICAL RESULT CALLED TO, READ BACK BY AND VERIFIED WITH: NATHAN BELEUE @2352  ON 02/13/24 SKL Performed at [facility], [address] [place], [place] [address]    Culture Romie Minus NEGATIVE RODS GRAM POSITIVE RODS   Final   Report Status PENDING  Incomplete  Blood Culture ID Panel (Reflexed)     Status: Abnormal   Collection Time: 02/11/24  3:35 PM  Result Value Ref Range Status   Enterococcus faecalis NOT DETECTED NOT DETECTED Final   Enterococcus Faecium NOT DETECTED NOT DETECTED Final   Listeria monocytogenes NOT DETECTED NOT DETECTED Final   Staphylococcus species NOT DETECTED NOT DETECTED Final   Staphylococcus aureus (BCID) NOT DETECTED NOT DETECTED Final   Staphylococcus epidermidis NOT DETECTED NOT DETECTED Final   Staphylococcus lugdunensis NOT DETECTED NOT DETECTED Final   Streptococcus species NOT DETECTED NOT DETECTED Final   Streptococcus agalactiae NOT DETECTED NOT DETECTED Final   Streptococcus pneumoniae NOT DETECTED NOT DETECTED Final   Streptococcus pyogenes NOT DETECTED NOT DETECTED Final   A.calcoaceticus-baumannii NOT DETECTED NOT DETECTED Final   Bacteroides fragilis NOT DETECTED NOT DETECTED Final   Enterobacterales DETECTED (A) NOT DETECTED Final    Comment:  Enterobacterales represent a large order of gram negative bacteria, not a single organism. CRITICAL RESULT CALLED TO, READ BACK BY AND VERIFIED WITH: NATHAN BELEUE @2352  ON 02/13/24 SKL    Enterobacter cloacae complex NOT DETECTED NOT DETECTED Final   Escherichia coli DETECTED (A) NOT DETECTED Final    Comment: CRITICAL RESULT CALLED TO, READ BACK BY AND VERIFIED WITH: NATHAN BELEUE @2352  ON 02/13/24 SKL    Klebsiella aerogenes NOT DETECTED NOT DETECTED Final   Klebsiella oxytoca NOT DETECTED NOT DETECTED Final   Klebsiella pneumoniae NOT DETECTED NOT DETECTED Final   Proteus species NOT DETECTED NOT DETECTED Final   Salmonella species NOT DETECTED NOT DETECTED Final   Serratia marcescens NOT DETECTED NOT DETECTED Final   Haemophilus influenzae NOT DETECTED NOT DETECTED Final   Neisseria meningitidis NOT DETECTED NOT DETECTED Final   Pseudomonas aeruginosa NOT DETECTED NOT DETECTED Final   Stenotrophomonas maltophilia NOT DETECTED NOT DETECTED Final   Candida albicans NOT DETECTED NOT DETECTED Final   Candida auris NOT DETECTED NOT DETECTED Final   Candida glabrata NOT DETECTED NOT DETECTED Final   Candida krusei NOT DETECTED NOT DETECTED Final   Candida parapsilosis NOT DETECTED NOT DETECTED Final   Candida tropicalis NOT DETECTED NOT DETECTED Final   Cryptococcus neoformans/gattii NOT DETECTED NOT DETECTED Final   CTX-M ESBL NOT DETECTED NOT DETECTED Final   Carbapenem resistance IMP NOT DETECTED NOT DETECTED Final   Carbapenem resistance KPC NOT DETECTED NOT DETECTED Final   Carbapenem resistance NDM NOT DETECTED NOT DETECTED Final   Carbapenem resist OXA 48 LIKE NOT DETECTED NOT DETECTED Final   Carbapenem resistance VIM NOT DETECTED NOT DETECTED Final    Comment: Performed at [facility], [address] [place], [place] [address]         Radiology Studies: DG Abd Portable 1V Result Date: 02/13/2024 CLINICAL DATA:  Abdominal pain, weakness EXAM: PORTABLE ABDOMEN  - 1 VIEW COMPARISON:  CT 09/30/2020 FINDINGS: Nonobstructive bowel gas pattern. No organomegaly, free air or suspicious calcification. Acute appearing fractures noted through the left superior and inferior pubic rami. Mild symmetric degenerative changes in the hips. IMPRESSION: No evidence of bowel obstruction or free air. Left superior and inferior pubic  rami fractures, appear acute. Electronically Signed   By: Charlett Nose M.D.   On: 02/13/2024 20:19        Scheduled Meds:  Chlorhexidine Gluconate Cloth  6 each Topical Q0600   ezetimibe  10 mg Oral Daily   feeding supplement  237 mL Oral TID   fluconazole  150 mg Oral Daily   levothyroxine  100 mcg Oral Daily   pantoprazole  40 mg Oral Daily   Rivaroxaban  15 mg Oral Q supper   sertraline  100 mg Oral Daily   Continuous Infusions:  cefTRIAXone (ROCEPHIN)  IV Stopped (02/14/24 2130)     LOS: 3 days       Charise Killian, MD [facility] Pager [phone]  If 7PM-7AM, please contact night-coverage [email or web address] 02/14/2024, 8:41 AM

## 2024-02-14 NOTE — Progress Notes (Addendum)
 Mobility Specialist - Progress Note  During mobility: HR 115, BP, SpO2 94% Post-mobility: HR 117, SPO2 97%   02/14/24 1009  Mobility  Activity Ambulated with assistance to bathroom;Ambulated with assistance in hallway  Level of Assistance Contact guard assist, steadying assist  Assistive Device Front wheel walker  Distance Ambulated (ft) 80 ft  LUE Weight Bearing Per Provider Order WBAT  RLE Weight Bearing Per Provider Order WBAT  LLE Weight Bearing Per Provider Order WBAT  Activity Response Tolerated well  Mobility visit 1 Mobility  Mobility Specialist Start Time (ACUTE ONLY) [id number]  Mobility Specialist Stop Time (ACUTE ONLY) 1004  Mobility Specialist Time Calculation (min) (ACUTE ONLY) 35 min   Pt semi fowler upon entry, utilizing RA. Pt agreeable to OOB activity this date, reported numbness of BLE. Pt completed bed mob indep, STS to RW and amb CGA-MinG to the restroom to void--- Pt endorsed SOB while seated on the commode. Pt required MinA for peri care, amb ~80 ft in the hallway MinG--- reported fatigue and SOB upon return to the room. Pt left semi fowler with alarm set and needs within reach.  Zetta Bills Mobility Specialist 02/14/24 10:14 AM

## 2024-02-14 NOTE — Plan of Care (Signed)
  Problem: Clinical Measurements: Goal: Diagnostic test results will improve Outcome: Progressing Goal: Signs and symptoms of infection will decrease Outcome: Progressing   Problem: Clinical Measurements: Goal: Diagnostic test results will improve Outcome: Progressing   Problem: Activity: Goal: Risk for activity intolerance will decrease Outcome: Progressing   Problem: Nutrition: Goal: Adequate nutrition will be maintained Outcome: Progressing   Problem: Coping: Goal: Level of anxiety will decrease Outcome: Progressing   Problem: Safety: Goal: Ability to remain free from injury will improve Outcome: Progressing

## 2024-02-14 NOTE — Progress Notes (Signed)
 PT Cancellation Note  Patient Details Name: SAIDY ORMAND MRN: [id number] DOB: [birth year]   Cancelled Treatment:     Pt resting in bed, politely declined PT this pm due to fatigue. Will re-attempt next available date/time per POC.   Jannet Askew 02/14/2024, 4:22 PM

## 2024-02-14 NOTE — Progress Notes (Signed)
 PHARMACY - PHYSICIAN COMMUNICATION CRITICAL VALUE ALERT - BLOOD CULTURE IDENTIFICATION (BCID)  Results for orders placed or performed during the hospital encounter of 02/11/24  Urine Culture     Status: Abnormal (Preliminary result)   Collection Time: 02/11/24  1:18 PM   Specimen: Urine, Catheterized  Result Value Ref Range Status   Specimen Description   Final    URINE, CATHETERIZED Performed at [facility], [address], [place], [place] [address]    Special Requests   Final    NONE Performed at [facility], [address] [place], [place] [address]    Culture (A)  Final    >=100,000 COLONIES/mL ESCHERICHIA COLI 90,000 COLONIES/mL GRAM NEGATIVE RODS CULTURE REINCUBATED FOR BETTER GROWTH Performed at [facility], [address] [place], [place] [address]    Report Status PENDING  Incomplete   Organism ID, Bacteria ESCHERICHIA COLI (A)  Final      Susceptibility   Escherichia coli - MIC*    AMPICILLIN <=2 SENSITIVE Sensitive     CEFAZOLIN <=4 SENSITIVE Sensitive     CEFEPIME <=0.12 SENSITIVE Sensitive     CEFTRIAXONE <=0.25 SENSITIVE Sensitive     CIPROFLOXACIN <=0.25 SENSITIVE Sensitive     GENTAMICIN <=1 SENSITIVE Sensitive     IMIPENEM <=0.25 SENSITIVE Sensitive     NITROFURANTOIN <=16 SENSITIVE Sensitive     TRIMETH/SULFA <=20 SENSITIVE Sensitive     AMPICILLIN/SULBACTAM <=2 SENSITIVE Sensitive     PIP/TAZO <=4 SENSITIVE Sensitive ug/mL    * >=100,000 COLONIES/mL ESCHERICHIA COLI  Resp panel by RT-PCR (RSV, Flu A&B, Covid) Anterior Nasal Swab     Status: None   Collection Time: 02/11/24  3:05 PM   Specimen: Anterior Nasal Swab  Result Value Ref Range Status   SARS Coronavirus 2 by RT PCR NEGATIVE NEGATIVE Final    Comment: (NOTE) SARS-CoV-2 target nucleic acids are NOT DETECTED.  The SARS-CoV-2 RNA is generally detectable in upper respiratory specimens during the acute phase of infection. The lowest concentration of SARS-CoV-2 viral  copies this assay can detect is 138 copies/mL. A negative result does not preclude SARS-Cov-2 infection and should not be used as the sole basis for treatment or other patient management decisions. A negative result may occur with  improper specimen collection/handling, submission of specimen other than nasopharyngeal swab, presence of viral mutation(s) within the areas targeted by this assay, and inadequate number of viral copies(<138 copies/mL). A negative result must be combined with clinical observations, patient history, and epidemiological information. The expected result is Negative.  Fact Sheet for Patients:  BloggerCourse.com  Fact Sheet for Healthcare Providers:  SeriousBroker.it  This test is no t yet approved or cleared by the [place] FDA and  has been authorized for detection and/or diagnosis of SARS-CoV-2 by FDA under an Emergency Use Authorization (EUA). This EUA will remain  in effect (meaning this test can be used) for the duration of the COVID-19 declaration under Section 564(b)(1) of the Act, 21 U.S.C.section 360bbb-3(b)(1), unless the authorization is terminated  or revoked sooner.       Influenza A by PCR NEGATIVE NEGATIVE Final   Influenza B by PCR NEGATIVE NEGATIVE Final    Comment: (NOTE) The Xpert Xpress SARS-CoV-2/FLU/RSV plus assay is intended as an aid in the diagnosis of influenza from Nasopharyngeal swab specimens and should not be used as a sole basis for treatment. Nasal washings and aspirates are unacceptable for Xpert Xpress SARS-CoV-2/FLU/RSV testing.  Fact Sheet for Patients: BloggerCourse.com  Fact Sheet for Healthcare Providers: SeriousBroker.it  This test is not yet approved or cleared by the [place] FDA and has been authorized for detection and/or diagnosis of SARS-CoV-2 by FDA under an Emergency Use Authorization (EUA). This  EUA will remain in effect (meaning this test can be used) for the duration of the COVID-19 declaration under Section 564(b)(1) of the Act, 21 U.S.C. section 360bbb-3(b)(1), unless the authorization is terminated or revoked.     Resp Syncytial Virus by PCR NEGATIVE NEGATIVE Final    Comment: (NOTE) Fact Sheet for Patients: BloggerCourse.com  Fact Sheet for Healthcare Providers: SeriousBroker.it  This test is not yet approved or cleared by the [place] FDA and has been authorized for detection and/or diagnosis of SARS-CoV-2 by FDA under an Emergency Use Authorization (EUA). This EUA will remain in effect (meaning this test can be used) for the duration of the COVID-19 declaration under Section 564(b)(1) of the Act, 21 U.S.C. section 360bbb-3(b)(1), unless the authorization is terminated or revoked.  Performed at [facility], [address] [place], [place] [address]   Blood culture (routine x 2)     Status: None (Preliminary result)   Collection Time: 02/11/24  3:32 PM   Specimen: BLOOD  Result Value Ref Range Status   Specimen Description BLOOD BLOOD RIGHT ARM  Final   Special Requests   Final    BOTTLES DRAWN AEROBIC AND ANAEROBIC Blood Culture adequate volume   Culture   Final    NO GROWTH 2 DAYS Performed at [facility], [address] [place], [place] [address]    Report Status PENDING  Incomplete  Blood culture (routine x 2)     Status: None (Preliminary result)   Collection Time: 02/11/24  3:35 PM   Specimen: BLOOD  Result Value Ref Range Status   Specimen Description BLOOD RIGHT ANTECUBITAL  Final   Special Requests   Final    BOTTLES DRAWN AEROBIC AND ANAEROBIC Blood Culture results may not be optimal due to an inadequate volume of blood received in culture bottles   Culture  Setup Time   Final    Organism ID to follow GRAM NEGATIVE RODS GRAM POSITIVE RODS AEROBIC BOTTLE  ONLY CRITICAL RESULT CALLED TO, READ BACK BY AND VERIFIED WITH: Nidya Bouyer BELEUE @2352  ON 02/13/24 SKL Performed at [facility], [address] [place], [place] [address]    Culture Romie Minus NEGATIVE RODS GRAM POSITIVE RODS   Final   Report Status PENDING  Incomplete  Blood Culture ID Panel (Reflexed)     Status: Abnormal   Collection Time: 02/11/24  3:35 PM  Result Value Ref Range Status   Enterococcus faecalis NOT DETECTED NOT DETECTED Final   Enterococcus Faecium NOT DETECTED NOT DETECTED Final   Listeria monocytogenes NOT DETECTED NOT DETECTED Final   Staphylococcus species NOT DETECTED NOT DETECTED Final   Staphylococcus aureus (BCID) NOT DETECTED NOT DETECTED Final   Staphylococcus epidermidis NOT DETECTED NOT DETECTED Final   Staphylococcus lugdunensis NOT DETECTED NOT DETECTED Final   Streptococcus species NOT DETECTED NOT DETECTED Final   Streptococcus agalactiae NOT DETECTED NOT DETECTED Final   Streptococcus pneumoniae NOT DETECTED NOT DETECTED Final   Streptococcus pyogenes NOT DETECTED NOT DETECTED Final   A.calcoaceticus-baumannii NOT DETECTED NOT DETECTED Final   Bacteroides fragilis NOT DETECTED NOT DETECTED Final   Enterobacterales DETECTED (A) NOT DETECTED Final    Comment: Enterobacterales represent a large order of gram negative bacteria, not a single organism. CRITICAL RESULT CALLED TO,  READ BACK BY AND VERIFIED WITH: Bronda Alfred BELEUE @2352  ON 02/13/24 SKL    Enterobacter cloacae complex NOT DETECTED NOT DETECTED Final   Escherichia coli DETECTED (A) NOT DETECTED Final    Comment: CRITICAL RESULT CALLED TO, READ BACK BY AND VERIFIED WITH: Melady Chow BELEUE @2352  ON 02/13/24 SKL    Klebsiella aerogenes NOT DETECTED NOT DETECTED Final   Klebsiella oxytoca NOT DETECTED NOT DETECTED Final   Klebsiella pneumoniae NOT DETECTED NOT DETECTED Final   Proteus species NOT DETECTED NOT DETECTED Final   Salmonella species NOT DETECTED NOT DETECTED Final   Serratia marcescens  NOT DETECTED NOT DETECTED Final   Haemophilus influenzae NOT DETECTED NOT DETECTED Final   Neisseria meningitidis NOT DETECTED NOT DETECTED Final   Pseudomonas aeruginosa NOT DETECTED NOT DETECTED Final   Stenotrophomonas maltophilia NOT DETECTED NOT DETECTED Final   Candida albicans NOT DETECTED NOT DETECTED Final   Candida auris NOT DETECTED NOT DETECTED Final   Candida glabrata NOT DETECTED NOT DETECTED Final   Candida krusei NOT DETECTED NOT DETECTED Final   Candida parapsilosis NOT DETECTED NOT DETECTED Final   Candida tropicalis NOT DETECTED NOT DETECTED Final   Cryptococcus neoformans/gattii NOT DETECTED NOT DETECTED Final   CTX-M ESBL NOT DETECTED NOT DETECTED Final   Carbapenem resistance IMP NOT DETECTED NOT DETECTED Final   Carbapenem resistance KPC NOT DETECTED NOT DETECTED Final   Carbapenem resistance NDM NOT DETECTED NOT DETECTED Final   Carbapenem resist OXA 48 LIKE NOT DETECTED NOT DETECTED Final   Carbapenem resistance VIM NOT DETECTED NOT DETECTED Final    Comment: Performed at [facility], [address] [place], [place] [address]    BCID Results: 1 (aerobic) of 4 bottles with E coli, no resistance and GPRs.  Will send to [facility] for further ID of GPRs.  Pt currently on Cefepime and Fluconazole (x 2 dose).  Urine Cx results E. coli as well.    Name of provider contacted: Cliffton Asters, NP   Changes to prescribed antibiotics required: Transition pt from Cefepime to Ceftriaxone.  Otelia Sergeant, PharmD, MBA 02/14/2024 1:55 AM

## 2024-02-15 DIAGNOSIS — R7881 Bacteremia: Secondary | ICD-10-CM

## 2024-02-15 DIAGNOSIS — N390 Urinary tract infection, site not specified: Secondary | ICD-10-CM | POA: Diagnosis not present

## 2024-02-15 LAB — URINE CULTURE: Culture: >=100000 — AB

## 2024-02-15 LAB — CBC
HCT: 31.9 % — ABNORMAL LOW (ref 36.0–46.0)
Hemoglobin: 10.7 g/dL — ABNORMAL LOW (ref 12.0–15.0)
MCH: 30.2 pg (ref 26.0–34.0)
MCHC: 33.5 g/dL (ref 30.0–36.0)
MCV: 90.1 fL (ref 80.0–100.0)
Platelets: 261 10*3/uL (ref 150–400)
RBC: 3.54 MIL/uL — ABNORMAL LOW (ref 3.87–5.11)
RDW: 14.4 % (ref 11.5–15.5)
WBC: 9.7 10*3/uL (ref 4.0–10.5)
nRBC: 0 % (ref 0.0–0.2)

## 2024-02-15 LAB — BASIC METABOLIC PANEL
Anion gap: 7 (ref 5–15)
BUN: 18 mg/dL (ref 8–23)
CO2: 27 mmol/L (ref 22–32)
Calcium: 8.6 mg/dL — ABNORMAL LOW (ref 8.9–10.3)
Chloride: 104 mmol/L (ref 98–111)
Creatinine, Ser: 0.81 mg/dL (ref 0.44–1.00)
GFR, Estimated: >60 mL/min (ref >60)
Glucose, Bld: 94 mg/dL (ref 70–99)
Potassium: 3.7 mmol/L (ref 3.5–5.1)
Sodium: 138 mmol/L (ref 135–145)

## 2024-02-15 MED ORDER — BISACODYL 5 MG PO TBEC: 5.0000 mg | DELAYED_RELEASE_TABLET | Freq: Every day | ORAL | Status: DC | PRN

## 2024-02-15 NOTE — Plan of Care (Signed)

## 2024-02-15 NOTE — Progress Notes (Signed)
 PROGRESS NOTE    Lauren Hammond  ZOX:[id number] DOB: February 28, 1932 DOA: 02/11/2024 PCP: Barbette Reichmann, MD   Assessment & Plan:   Principal Problem:   UTI (urinary tract infection) Active Problems:   Sepsis (HCC)   Atrial fibrillation (HCC)   History of fall   Clinical depression   HLD (hyperlipidemia)   Adult hypothyroidism   COPD (chronic obstructive pulmonary disease) (HCC)  Assessment and Plan: Sepsis: met criteria w/ tachycardia, tachypnea & likely secondary to catheter associated UTI. Continue on IV abxs. Sepsis resolved  Catheter associated UTI: continue on IV cefepime. Urine cx is growing e.coli, klebsiella. Catheter exchanged in ER. Catheter was initially placed at [facility] so present on admission   Bacteremia: likely secondary catheter associated UTI. Blood cxs growing e.coli. Continue on IV cefepime. Repeat blood cxs ordered.  Urinary retention: s/p catheter exchange in the ER. Continue w/ supportive care. Will f/u outpatient w/ uro    Pelvic fracture:w/ recent pelvic hematoma. Likely secondary to fall at home. Noted for this on admission at [facility]. PT recs HH. OT recs SNF   L distal radius fracture: likely secondary to fall at home. Noted for this on admission at [facility]. PT recs HH. OT recs SNF    Likely PAF: not on rate controlling meds as per med rec. Continue on home dose of xarelto. Monitor for signs/symptoms of bleeding w/ recent hx of pelvic hematoma  Hypokalemia: WNL today    COPD: w/o exacerbation. Bronchodilators prn     Hypothyroidism: continue on home dose of levothyroxine    HLD: continue on home dose of zetia   Depression: severity unknown. Continue on home dose of sertraline          DVT prophylaxis: xarelto  Code Status: DNR Family Communication: discussed pt's care w/ pt's daughter, Lamar Laundry, and answered her questions Disposition Plan: likely d/c home w/ HH  Level of care: Telemetry Medical  Status is: Inpatient Remains inpatient appropriate  because: severity of illness, blood cxs are positive, repeat blood cxs order to confirm infection is clearing     Consultants:    Procedures:   Antimicrobials:   Subjective: Pt c/o malaise   Objective: Vitals:   02/14/24 1505 02/14/24 2005 02/15/24 0305 02/15/24 0755  BP: 118/67 (!) 131/59 (!) 141/84 132/72  Pulse: 70 86 64 81  Resp: 16 18 14 16   Temp: 98.2 F (36.8 C) 98.2 F (36.8 C) 97.7 F (36.5 C) 97.8 F (36.6 C)  TempSrc:      SpO2: 96% 98% 97% 97%  Weight:      Height:        Intake/Output Summary (Last 24 hours) at 02/15/2024 0826 Last data filed at 02/15/2024 8119 Gross per 24 hour  Intake 700 ml  Output 2900 ml  Net -2200 ml   Filed Weights   02/11/24 1002  Weight: 65 kg    Examination:  General exam: Appears comfortable  Respiratory system: clear breath sounds b/l  Cardiovascular system: S1/S2+. No rubs or gallops Gastrointestinal system: abd pain, NT, ND & hypoactive bowel sounds Central nervous system: alert & awake. Moves all extremities   Psychiatry: judgement and insight appears at baseline. Flat mood and affect     Data Reviewed: I have personally reviewed following labs and imaging studies  CBC: Recent Labs  Lab 02/11/24 1004 02/12/24 0134 02/13/24 0447 02/14/24 0336 02/15/24 0455  WBC 15.2* 13.6* 10.3 8.7 9.7  HGB 11.3* 13.2 10.7* 10.6* 10.7*  HCT 34.0* 40.4 33.3* 31.9* 31.9*  MCV 91.6 94.6 94.1 90.9 90.1  PLT 229 224 201 224 261   Basic Metabolic Panel: Recent Labs  Lab 02/11/24 1004 02/12/24 0134 02/13/24 0447 02/14/24 0336 02/15/24 0455  NA 129* 134* 137 138 138  K 3.7 3.5 3.5 3.2* 3.7  CL 99 101 101 104 104  CO2 23 23 26 26 27   GLUCOSE 162* 118* 104* 91 94  BUN 22 19 16 20 18   CREATININE 0.98 0.95 0.91 0.83 0.81  CALCIUM 8.3* 8.5* 8.5* 8.3* 8.6*   GFR: Estimated Creatinine Clearance: 44.1 mL/min (by C-G formula based on SCr of 0.81 mg/dL). Liver Function Tests: Recent Labs  Lab 02/12/24 0134  AST 27   ALT 15  ALKPHOS 104  BILITOT 0.8  PROT 6.8  ALBUMIN 2.8*   No results for input(s): "LIPASE", "AMYLASE" in the last 168 hours. No results for input(s): "AMMONIA" in the last 168 hours. Coagulation Profile: No results for input(s): "INR", "PROTIME" in the last 168 hours. Cardiac Enzymes: No results for input(s): "CKTOTAL", "CKMB", "CKMBINDEX", "TROPONINI" in the last 168 hours. BNP (last 3 results) No results for input(s): "PROBNP" in the last 8760 hours. HbA1C: No results for input(s): "HGBA1C" in the last 72 hours. CBG: Recent Labs  Lab 02/11/24 1316  GLUCAP 101*   Lipid Profile: No results for input(s): "CHOL", "HDL", "LDLCALC", "TRIG", "CHOLHDL", "LDLDIRECT" in the last 72 hours. Thyroid Function Tests: No results for input(s): "TSH", "T4TOTAL", "FREET4", "T3FREE", "THYROIDAB" in the last 72 hours. Anemia Panel: No results for input(s): "VITAMINB12", "FOLATE", "FERRITIN", "TIBC", "IRON", "RETICCTPCT" in the last 72 hours. Sepsis Labs: Recent Labs  Lab 02/11/24 1505 02/11/24 1707  LATICACIDVEN 1.5 1.2    Recent Results (from the past 240 hours)  Urine Culture     Status: Abnormal   Collection Time: 02/11/24  1:18 PM   Specimen: Urine, Catheterized  Result Value Ref Range Status   Specimen Description   Final    URINE, CATHETERIZED Performed at [facility], [address], [place], [place] [address]    Special Requests   Final    NONE Performed at [facility], [address] [place], [place] [address]    Culture (A)  Final    >=100,000 COLONIES/mL ESCHERICHIA COLI 90,000 COLONIES/mL KLEBSIELLA PNEUMONIAE    Report Status 02/15/2024 FINAL  Final   Organism ID, Bacteria ESCHERICHIA COLI (A)  Final   Organism ID, Bacteria KLEBSIELLA PNEUMONIAE (A)  Final      Susceptibility   Escherichia coli - MIC*    AMPICILLIN <=2 SENSITIVE Sensitive     CEFAZOLIN <=4 SENSITIVE Sensitive     CEFEPIME <=0.12 SENSITIVE Sensitive     CEFTRIAXONE  <=0.25 SENSITIVE Sensitive     CIPROFLOXACIN <=0.25 SENSITIVE Sensitive     GENTAMICIN <=1 SENSITIVE Sensitive     IMIPENEM <=0.25 SENSITIVE Sensitive     NITROFURANTOIN <=16 SENSITIVE Sensitive     TRIMETH/SULFA <=20 SENSITIVE Sensitive     AMPICILLIN/SULBACTAM <=2 SENSITIVE Sensitive     PIP/TAZO <=4 SENSITIVE Sensitive ug/mL    * >=100,000 COLONIES/mL ESCHERICHIA COLI   Klebsiella pneumoniae - MIC*    AMPICILLIN RESISTANT Resistant     CEFAZOLIN <=4 SENSITIVE Sensitive     CEFEPIME <=0.12 SENSITIVE Sensitive     CEFTRIAXONE <=0.25 SENSITIVE Sensitive     CIPROFLOXACIN <=0.25 SENSITIVE Sensitive     GENTAMICIN <=1 SENSITIVE Sensitive     IMIPENEM <=0.25 SENSITIVE Sensitive     NITROFURANTOIN 32 SENSITIVE Sensitive  TRIMETH/SULFA <=20 SENSITIVE Sensitive     AMPICILLIN/SULBACTAM <=2 SENSITIVE Sensitive     PIP/TAZO <=4 SENSITIVE Sensitive ug/mL    * 90,000 COLONIES/mL KLEBSIELLA PNEUMONIAE  Resp panel by RT-PCR (RSV, Flu A&B, Covid) Anterior Nasal Swab     Status: None   Collection Time: 02/11/24  3:05 PM   Specimen: Anterior Nasal Swab  Result Value Ref Range Status   SARS Coronavirus 2 by RT PCR NEGATIVE NEGATIVE Final    Comment: (NOTE) SARS-CoV-2 target nucleic acids are NOT DETECTED.  The SARS-CoV-2 RNA is generally detectable in upper respiratory specimens during the acute phase of infection. The lowest concentration of SARS-CoV-2 viral copies this assay can detect is 138 copies/mL. A negative result does not preclude SARS-Cov-2 infection and should not be used as the sole basis for treatment or other patient management decisions. A negative result may occur with  improper specimen collection/handling, submission of specimen other than nasopharyngeal swab, presence of viral mutation(s) within the areas targeted by this assay, and inadequate number of viral copies(<138 copies/mL). A negative result must be combined with clinical observations, patient history, and  epidemiological information. The expected result is Negative.  Fact Sheet for Patients:  BloggerCourse.com  Fact Sheet for Healthcare Providers:  SeriousBroker.it  This test is no t yet approved or cleared by the [place] FDA and  has been authorized for detection and/or diagnosis of SARS-CoV-2 by FDA under an Emergency Use Authorization (EUA). This EUA will remain  in effect (meaning this test can be used) for the duration of the COVID-19 declaration under Section 564(b)(1) of the Act, 21 U.S.C.section 360bbb-3(b)(1), unless the authorization is terminated  or revoked sooner.       Influenza A by PCR NEGATIVE NEGATIVE Final   Influenza B by PCR NEGATIVE NEGATIVE Final    Comment: (NOTE) The Xpert Xpress SARS-CoV-2/FLU/RSV plus assay is intended as an aid in the diagnosis of influenza from Nasopharyngeal swab specimens and should not be used as a sole basis for treatment. Nasal washings and aspirates are unacceptable for Xpert Xpress SARS-CoV-2/FLU/RSV testing.  Fact Sheet for Patients: BloggerCourse.com  Fact Sheet for Healthcare Providers: SeriousBroker.it  This test is not yet approved or cleared by the [place] FDA and has been authorized for detection and/or diagnosis of SARS-CoV-2 by FDA under an Emergency Use Authorization (EUA). This EUA will remain in effect (meaning this test can be used) for the duration of the COVID-19 declaration under Section 564(b)(1) of the Act, 21 U.S.C. section 360bbb-3(b)(1), unless the authorization is terminated or revoked.     Resp Syncytial Virus by PCR NEGATIVE NEGATIVE Final    Comment: (NOTE) Fact Sheet for Patients: BloggerCourse.com  Fact Sheet for Healthcare Providers: SeriousBroker.it  This test is not yet approved or cleared by the [place] FDA and has been  authorized for detection and/or diagnosis of SARS-CoV-2 by FDA under an Emergency Use Authorization (EUA). This EUA will remain in effect (meaning this test can be used) for the duration of the COVID-19 declaration under Section 564(b)(1) of the Act, 21 U.S.C. section 360bbb-3(b)(1), unless the authorization is terminated or revoked.  Performed at [facility], [address] [place], [place] [address]   Blood culture (routine x 2)     Status: None (Preliminary result)   Collection Time: 02/11/24  3:32 PM   Specimen: BLOOD  Result Value Ref Range Status   Specimen Description BLOOD BLOOD RIGHT ARM  Final   Special Requests   Final  BOTTLES DRAWN AEROBIC AND ANAEROBIC Blood Culture adequate volume   Culture   Final    NO GROWTH 4 DAYS Performed at [facility], [address] [place], [place] [address]    Report Status PENDING  Incomplete  Blood culture (routine x 2)     Status: None (Preliminary result)   Collection Time: 02/11/24  3:35 PM   Specimen: BLOOD  Result Value Ref Range Status   Specimen Description BLOOD RIGHT ANTECUBITAL  Final   Special Requests   Final    BOTTLES DRAWN AEROBIC AND ANAEROBIC Blood Culture results may not be optimal due to an inadequate volume of blood received in culture bottles   Culture  Setup Time   Final    Organism ID to follow GRAM NEGATIVE RODS GRAM POSITIVE RODS AEROBIC BOTTLE ONLY CRITICAL RESULT CALLED TO, READ BACK BY AND VERIFIED WITH: NATHAN BELEUE @2352  ON 02/13/24 SKL Performed at [facility], [address], [place], [place] [address]    Culture Romie Minus NEGATIVE RODS GRAM POSITIVE RODS   Final   Report Status PENDING  Incomplete  Blood Culture ID Panel (Reflexed)     Status: Abnormal   Collection Time: 02/11/24  3:35 PM  Result Value Ref Range Status   Enterococcus faecalis NOT DETECTED NOT DETECTED Final   Enterococcus Faecium NOT DETECTED NOT DETECTED Final   Listeria monocytogenes NOT  DETECTED NOT DETECTED Final   Staphylococcus species NOT DETECTED NOT DETECTED Final   Staphylococcus aureus (BCID) NOT DETECTED NOT DETECTED Final   Staphylococcus epidermidis NOT DETECTED NOT DETECTED Final   Staphylococcus lugdunensis NOT DETECTED NOT DETECTED Final   Streptococcus species NOT DETECTED NOT DETECTED Final   Streptococcus agalactiae NOT DETECTED NOT DETECTED Final   Streptococcus pneumoniae NOT DETECTED NOT DETECTED Final   Streptococcus pyogenes NOT DETECTED NOT DETECTED Final   A.calcoaceticus-baumannii NOT DETECTED NOT DETECTED Final   Bacteroides fragilis NOT DETECTED NOT DETECTED Final   Enterobacterales DETECTED (A) NOT DETECTED Final    Comment: Enterobacterales represent a large order of gram negative bacteria, not a single organism. CRITICAL RESULT CALLED TO, READ BACK BY AND VERIFIED WITH: NATHAN BELEUE @2352  ON 02/13/24 SKL    Enterobacter cloacae complex NOT DETECTED NOT DETECTED Final   Escherichia coli DETECTED (A) NOT DETECTED Final    Comment: CRITICAL RESULT CALLED TO, READ BACK BY AND VERIFIED WITH: NATHAN BELEUE @2352  ON 02/13/24 SKL    Klebsiella aerogenes NOT DETECTED NOT DETECTED Final   Klebsiella oxytoca NOT DETECTED NOT DETECTED Final   Klebsiella pneumoniae NOT DETECTED NOT DETECTED Final   Proteus species NOT DETECTED NOT DETECTED Final   Salmonella species NOT DETECTED NOT DETECTED Final   Serratia marcescens NOT DETECTED NOT DETECTED Final   Haemophilus influenzae NOT DETECTED NOT DETECTED Final   Neisseria meningitidis NOT DETECTED NOT DETECTED Final   Pseudomonas aeruginosa NOT DETECTED NOT DETECTED Final   Stenotrophomonas maltophilia NOT DETECTED NOT DETECTED Final   Candida albicans NOT DETECTED NOT DETECTED Final   Candida auris NOT DETECTED NOT DETECTED Final   Candida glabrata NOT DETECTED NOT DETECTED Final   Candida krusei NOT DETECTED NOT DETECTED Final   Candida parapsilosis NOT DETECTED NOT DETECTED Final   Candida  tropicalis NOT DETECTED NOT DETECTED Final   Cryptococcus neoformans/gattii NOT DETECTED NOT DETECTED Final   CTX-M ESBL NOT DETECTED NOT DETECTED Final   Carbapenem resistance IMP NOT DETECTED NOT DETECTED Final   Carbapenem resistance KPC NOT DETECTED NOT DETECTED Final  Carbapenem resistance NDM NOT DETECTED NOT DETECTED Final   Carbapenem resist OXA 48 LIKE NOT DETECTED NOT DETECTED Final   Carbapenem resistance VIM NOT DETECTED NOT DETECTED Final    Comment: Performed at [facility], [address], [place], [place] [address]         Radiology Studies: DG Abd Portable 1V Result Date: 02/13/2024 CLINICAL DATA:  Abdominal pain, weakness EXAM: PORTABLE ABDOMEN - 1 VIEW COMPARISON:  CT 09/30/2020 FINDINGS: Nonobstructive bowel gas pattern. No organomegaly, free air or suspicious calcification. Acute appearing fractures noted through the left superior and inferior pubic rami. Mild symmetric degenerative changes in the hips. IMPRESSION: No evidence of bowel obstruction or free air. Left superior and inferior pubic rami fractures, appear acute. Electronically Signed   By: Charlett Nose M.D.   On: 02/13/2024 20:19        Scheduled Meds:  Chlorhexidine Gluconate Cloth  6 each Topical Q0600   ezetimibe  10 mg Oral Daily   feeding supplement  237 mL Oral TID   levothyroxine  100 mcg Oral Daily   pantoprazole  40 mg Oral Daily   Rivaroxaban  15 mg Oral Q supper   sertraline  100 mg Oral Daily   Continuous Infusions:  cefTRIAXone (ROCEPHIN)  IV Stopped (02/15/24 0610)     LOS: 4 days       Charise Killian, MD [facility] Pager [phone]  If 7PM-7AM, please contact night-coverage [email or web address] 02/15/2024, 8:26 AM

## 2024-02-15 NOTE — Plan of Care (Signed)
  Problem: Clinical Measurements: Goal: Signs and symptoms of infection will decrease Outcome: Progressing   Problem: Clinical Measurements: Goal: Diagnostic test results will improve Outcome: Progressing   Problem: Activity: Goal: Risk for activity intolerance will decrease Outcome: Progressing   Problem: Nutrition: Goal: Adequate nutrition will be maintained Outcome: Progressing   Problem: Coping: Goal: Level of anxiety will decrease Outcome: Progressing   Problem: Pain Managment: Goal: General experience of comfort will improve and/or be controlled Outcome: Progressing   Problem: Safety: Goal: Ability to remain free from injury will improve Outcome: Progressing

## 2024-02-15 NOTE — Progress Notes (Signed)
 Physical Therapy Treatment Patient Details Name: Lauren Hammond MRN: [id number] DOB: 01-04-1932 Today's Date: 02/15/2024   History of Present Illness presented to ER from home secondary to progressive weakness, general malaise; admited for mangement of sepsis related to UTI.  Of note, recent R distal radial fracture, pelvic fracture and pelvic hematoma s/p fall; WBAT bilat UE/LE per previous records    PT Comments  Patient continues to improve in activity tolerance, functional indep, completing full lap around nursing station with RW, cga/close sup.   Demonstrates reciprocal stepping pattern with fair step height/length; steady cadence without buckling, LOB or safety concern.  Good RW position and control.    Extended discussion with patient/daugher re: home safety modifications (use of BSC when patient home alone, use of bright duct tape for visual discrimination with foley management, impact of seating surfaces, use of RW, activity progression), current functional status and level of supervision/assist required.  Also discussed discharge options/recommendations (per physician request)--per daughter, does have aide service set up for "busy" times of day (8-12am, 5-9pm) but can increase to all daytime hours if needed.  Patient voices definite preference for home--agreeable to increasing aide services if needed and agreeable to HHPT/OT.  MD/TOC informed and aware.  Will continue to follow acutely and continue mobility progression as appropriate    If plan is discharge home, recommend the following: A little help with walking and/or transfers;A little help with bathing/dressing/bathroom   Can travel by private vehicle        Equipment Recommendations  Rolling walker (2 wheels)    Recommendations for Other Services       Precautions / Restrictions Precautions Precautions: Fall Recall of Precautions/Restrictions: Intact Restrictions Weight Bearing Restrictions Per Provider Order: Yes LUE  Weight Bearing Per Provider Order: Weight bearing as tolerated RLE Weight Bearing Per Provider Order: Weight bearing as tolerated LLE Weight Bearing Per Provider Order: Weight bearing as tolerated Other Position/Activity Restrictions: WBAT pelvic fx and L radial fx with soft brace     Mobility  Bed Mobility Overal bed mobility: Needs Assistance Bed Mobility: Supine to Sit     Supine to sit: Supervision          Transfers Overall transfer level: Needs assistance Equipment used: Rolling walker (2 wheels) Transfers: Sit to/from Stand Sit to Stand: Contact guard assist           General transfer comment: UE support required to complete    Ambulation/Gait Ambulation/Gait assistance: Contact guard assist Gait Distance (Feet): 200 Feet Assistive device: Rolling walker (2 wheels)         General Gait Details: reciprocal stepping pattern with fair step height/length; steady cadence without buckling, LOB or safety concern.  Good RW position and control   Stairs             Wheelchair Mobility     Tilt Bed    Modified Rankin (Stroke Patients Only)       Balance Overall balance assessment: Needs assistance Sitting-balance support: No upper extremity supported, Feet supported Sitting balance-Leahy Scale: Good     Standing balance support: Bilateral upper extremity supported Standing balance-Leahy Scale: Fair                              Communication    Cognition Arousal: Alert Behavior During Therapy: WFL for tasks assessed/performed   PT - Cognitive impairments: No apparent impairments  Cueing    Exercises Other Exercises Other Exercises: Extended discussion with patient/daugher re: home safety modifications (use of BSC when patient home alone, use of bright duct tape for visual discrimination with foley management, impact of seating surfaces, use of RW, activity progression).  Also discussed  discharge options/recommendations--per daughter, does have aide service set up for "busy" times of day (8-12am, 5-9pm) but can increase to all daytime hours if needed.  Patient voices definite preference for home--agreeable to increasing aide services if needed and agreeable to HHPT/OT.    General Comments        Pertinent Vitals/Pain Pain Assessment Pain Assessment: No/denies pain    Home Living                          Prior Function            PT Goals (current goals can now be found in the care plan section) Acute Rehab PT Goals Patient Stated Goal: to get stronger and get back home PT Goal Formulation: With patient/family Time For Goal Achievement: 02/26/24 Potential to Achieve Goals: Good Progress towards PT goals: Progressing toward goals    Frequency    Min 3X/week      PT Plan      Co-evaluation              AM-PAC PT "6 Clicks" Mobility   Outcome Measure  Help needed turning from your back to your side while in a flat bed without using bedrails?: None Help needed moving from lying on your back to sitting on the side of a flat bed without using bedrails?: None Help needed moving to and from a bed to a chair (including a wheelchair)?: A Little Help needed standing up from a chair using your arms (e.g., wheelchair or bedside chair)?: A Little Help needed to walk in hospital room?: A Little Help needed climbing 3-5 steps with a railing? : A Little 6 Click Score: 20    End of Session Equipment Utilized During Treatment: Gait belt Activity Tolerance: Patient tolerated treatment well Patient left: in chair;with call bell/phone within reach;with chair alarm set;with family/visitor present Nurse Communication: Mobility status PT Visit Diagnosis: Muscle weakness (generalized) (M62.81);Difficulty in walking, not elsewhere classified (R26.2)     Time: 1478-2956 PT Time Calculation (min) (ACUTE ONLY): 35 min  Charges:    $Gait Training: 8-22  mins $Therapeutic Activity: 8-22 mins PT General Charges $$ ACUTE PT VISIT: 1 Visit                     Lauren Hammond H. Manson Passey, PT, DPT, NCS 02/15/24, 3:20 PM [phone]

## 2024-02-16 DIAGNOSIS — R7881 Bacteremia: Secondary | ICD-10-CM | POA: Diagnosis not present

## 2024-02-16 DIAGNOSIS — N390 Urinary tract infection, site not specified: Secondary | ICD-10-CM | POA: Diagnosis not present

## 2024-02-16 LAB — BASIC METABOLIC PANEL
Anion gap: 8 (ref 5–15)
BUN: 22 mg/dL (ref 8–23)
CO2: 25 mmol/L (ref 22–32)
Calcium: 8.5 mg/dL — ABNORMAL LOW (ref 8.9–10.3)
Chloride: 104 mmol/L (ref 98–111)
Creatinine, Ser: 0.78 mg/dL (ref 0.44–1.00)
GFR, Estimated: >60 mL/min (ref >60)
Glucose, Bld: 98 mg/dL (ref 70–99)
Potassium: 3.6 mmol/L (ref 3.5–5.1)
Sodium: 137 mmol/L (ref 135–145)

## 2024-02-16 LAB — CULTURE, BLOOD (ROUTINE X 2)
Culture: NO GROWTH
Special Requests: ADEQUATE

## 2024-02-16 LAB — CBC
HCT: 32.3 % — ABNORMAL LOW (ref 36.0–46.0)
Hemoglobin: 10.6 g/dL — ABNORMAL LOW (ref 12.0–15.0)
MCH: 30.1 pg (ref 26.0–34.0)
MCHC: 32.8 g/dL (ref 30.0–36.0)
MCV: 91.8 fL (ref 80.0–100.0)
Platelets: 281 10*3/uL (ref 150–400)
RBC: 3.52 MIL/uL — ABNORMAL LOW (ref 3.87–5.11)
RDW: 14.6 % (ref 11.5–15.5)
WBC: 9.7 10*3/uL (ref 4.0–10.5)
nRBC: 0 % (ref 0.0–0.2)

## 2024-02-16 MED ORDER — CEFDINIR 300 MG PO CAPS: 300.0000 mg | ORAL_CAPSULE | Freq: Two times a day (BID) | ORAL | 0 refills | Status: AC

## 2024-02-16 NOTE — Plan of Care (Signed)

## 2024-02-16 NOTE — Discharge Summary (Addendum)
 Physician Discharge Summary  Lauren Hammond VQQ:[id number] DOB: [birth year] DOA: 02/11/2024  PCP: Barbette Reichmann, MD  Admit date: 02/11/2024 Discharge date: 02/16/2024  Admitted From: home  Disposition:  home w/ home health   Recommendations for Outpatient Follow-up:  Follow up with PCP in 1-2 weeks  Home Health: yes Equipment/Devices:  Discharge Condition: stable  CODE STATUS: full  Diet recommendation: Heart Healthy  Brief/Interim Summary: HPI was taken from Dr. Alvester Morin: Lauren Hammond is a 89 y.o. female with medical history significant of HFpEF, atrial fibrillation, hypertension, hyperlipidemia, COPD, depression, hypothyroidism presenting with sepsis, catheter associated UTI, weakness.  Patient noted to have been admitted March 2025 in the [facility] system for issues including fall, left distal radius fracture, pelvic fractures.  Also with pelvic hematoma urinary retention.  Patient was subsequently discharged with a Foley in place to inpatient rehab.  Recently discharged from inpatient rehab.  Per report, patient with worsening fatigue malaise at home.  Also with lower abdominal pain.  No fevers or chills.  Per the daughter, patient current lives at home by herself with noted worsening weakness. Presented to ER Tmax 99.1, heart rate 100s, respiration 20s, BP stable satting well on room air.  White count 15.2, hemoglobin 9.3, platelets 229, creatinine 0.98, sodium 129.  Urinalysis indicative of infection.  Chest x-ray stable.  Discharge Diagnoses:  Principal Problem:   UTI (urinary tract infection) Active Problems:   Sepsis (HCC)   Atrial fibrillation (HCC)   History of fall   Clinical depression   HLD (hyperlipidemia)   Adult hypothyroidism   COPD (chronic obstructive pulmonary disease) (HCC)  Sepsis: met criteria w/ tachycardia, tachypnea & likely secondary to catheter associated UTI. Continue on IV abxs. Sepsis resolved   Catheter associated UTI: continue on IV ceftriaxone & d/c  home on cefdinir. Urine cx is growing e.coli, klebsiella. Catheter exchanged in ER. Catheter was initially placed at [facility] so present on admission.    Bacteremia: likely secondary catheter associated UTI. Blood cxs growing e.coli. Continue on IV ceftriaxone and d/c home on po cefdinir. Repeat blood cxs NGTD   Urinary retention: s/p catheter exchange in the ER. Continue w/ supportive care. Will f/u outpatient w/ uro    Pelvic fracture:w/ recent pelvic hematoma. Likely secondary to fall at home. Noted for this on admission at [facility]. PT recs HH. OT recs SNF    L distal radius fracture: likely secondary to fall at home. Noted for this on admission at [facility]. PT recs HH. OT recs SNF    Likely PAF: not on rate controlling meds as per med rec. Continue on home dose of xarelto. Monitor for signs/symptoms of bleeding w/ recent hx of pelvic hematoma   Hypokalemia: WNL today    COPD: w/o exacerbation. Bronchodilators prn     Hypothyroidism: continue on home dose of levothyroxine    HLD: continue on home dose of zetia    Depression: severity unknown. Continue on home dose of sertraline    Discharge Instructions  Discharge Instructions     Diet - low sodium heart healthy   Complete by: As directed    Discharge instructions   Complete by: As directed    F/u w/ PCP in 1-2 weeks   Increase activity slowly   Complete by: As directed       Allergies as of 02/16/2024       Reactions   Atorvastatin Other (See Comments)   Muscle Aches   Bupropion Other (See Comments)   Unspecified   Other  Other (See Comments)   Unspecified, passed out Reaction: patient was unable to communicate and family member was unable to tell me any allergies    Percodan [oxycodone-aspirin]    Pravastatin Other (See Comments)   Muscle Aches        Medication List     TAKE these medications    amLODipine 2.5 MG tablet Commonly known as: NORVASC TAKE 1 TABLET BY MOUTH EVERY DAY   cefdinir 300 MG capsule Commonly  known as: OMNICEF Take 1 capsule (300 mg total) by mouth 2 (two) times daily for 7 days.   ezetimibe 10 MG tablet Commonly known as: ZETIA Take 10 mg by mouth daily.   furosemide 20 MG tablet Commonly known as: LASIX Take 1 tablet (20 mg total) by mouth as needed (swelling).   gabapentin 100 MG capsule Commonly known as: NEURONTIN Take 100 mg by mouth daily as needed (nerve pain).   HYDROcodone-acetaminophen 5-325 MG tablet Commonly known as: NORCO/VICODIN Take 1 tablet by mouth daily as needed for moderate pain.   ibuprofen 200 MG tablet Commonly known as: ADVIL Take 200 mg by mouth every 6 (six) hours as needed.   levothyroxine 100 MCG tablet Commonly known as: SYNTHROID Take 100 mcg by mouth daily.   losartan 25 MG tablet Commonly known as: COZAAR Takes 1 tablet by mouth as needed only if blood pressure is up. What changed:  how much to take how to take this when to take this reasons to take this additional instructions   mirtazapine 7.5 MG tablet Commonly known as: REMERON Take 7.5 mg by mouth.   Multi-Vitamins Tabs Take 1 tablet by mouth daily.   pantoprazole 40 MG tablet Commonly known as: PROTONIX Take 1 tablet by mouth daily.   potassium chloride SA 20 MEQ tablet Commonly known as: KLOR-CON M Take 1 tablet (20 mEq total) by mouth daily.   Rivaroxaban 15 MG Tabs tablet Commonly known as: XARELTO Take 1 tablet (15 mg total) by mouth daily with supper.   sertraline 100 MG tablet Commonly known as: ZOLOFT Take 100 mg by mouth daily.   temazepam 15 MG capsule Commonly known as: RESTORIL Take 15 mg by mouth at bedtime as needed for sleep.   tetrahydrozoline 0.05 % ophthalmic solution Place 1 drop into both eyes as needed (redness).   VITAMIN D PO Take 1 tablet by mouth daily.               Durable Medical Equipment  (From admission, onward)           Start     Ordered   02/12/24 1650  For home use only DME Walker rolling  Once        Question Answer Comment  Walker: With 5 Inch Wheels   Patient needs a walker to treat with the following condition Impaired mobility      02/12/24 1649   02/12/24 1646  For home use only DME Walker rolling  Once       Question Answer Comment  Walker: With 5 Inch Wheels   Patient needs a walker to treat with the following condition Impaired mobility      02/12/24 1646            Allergies  Allergen Reactions   Atorvastatin Other (See Comments)    Muscle Aches   Bupropion Other (See Comments)    Unspecified   Other Other (See Comments)    Unspecified, passed out Reaction: patient was unable to communicate  and family member was unable to tell me any allergies    Percodan [Oxycodone-Aspirin]    Pravastatin Other (See Comments)    Muscle Aches    Consultations:   Procedures/Studies: DG Abd Portable 1V Result Date: 02/13/2024 CLINICAL DATA:  Abdominal pain, weakness EXAM: PORTABLE ABDOMEN - 1 VIEW COMPARISON:  CT 09/30/2020 FINDINGS: Nonobstructive bowel gas pattern. No organomegaly, free air or suspicious calcification. Acute appearing fractures noted through the left superior and inferior pubic rami. Mild symmetric degenerative changes in the hips. IMPRESSION: No evidence of bowel obstruction or free air. Left superior and inferior pubic rami fractures, appear acute. Electronically Signed   By: Charlett Nose M.D.   On: 02/13/2024 20:19   DG Chest Portable 1 View Result Date: 02/11/2024 CLINICAL DATA:  Sepsis EXAM: PORTABLE CHEST 1 VIEW COMPARISON:  X-ray 05/26/2019.  CT chest 05/10/2020 FINDINGS: Slight elevation left hemidiaphragm, similar to previous. No pneumothorax, effusion or edema. Normal cardiopericardial silhouette calcified. No consolidation. Interstitial changes seen lungs which are chronic. Overlapping cardiac leads. IMPRESSION: Underinflation with slightly elevated left hemidiaphragm. Chronic lung changes. Electronically Signed   By: Karen Kays M.D.   On:  02/11/2024 17:07   (Echo, Carotid, EGD, Colonoscopy, ERCP)    Subjective: Pt c/o fatigue    Discharge Exam: Vitals:   02/15/24 2018 02/16/24 0444  BP: 125/82 (!) 149/66  Pulse: 83 82  Resp:    Temp: 98.2 F (36.8 C) (!) 97.5 F (36.4 C)  SpO2: 97% 96%   Vitals:   02/15/24 0755 02/15/24 1551 02/15/24 2018 02/16/24 0444  BP: 132/72 137/70 125/82 (!) 149/66  Pulse: 81 79 83 82  Resp: 16 18    Temp: 97.8 F (36.6 C) 97.8 F (36.6 C) 98.2 F (36.8 C) (!) 97.5 F (36.4 C)  TempSrc:   Oral Oral  SpO2: 97% 97% 97% 96%  Weight:      Height:        General: Pt is alert, awake, not in acute distress Cardiovascular: S1/S2 +, no rubs, no gallops Respiratory: CTA bilaterally, no wheezing, no rhonchi Abdominal: Soft, NT, ND, bowel sounds + Extremities: no cyanosis    The results of significant diagnostics from this hospitalization (including imaging, microbiology, ancillary and laboratory) are listed below for reference.     Microbiology: Recent Results (from the past 240 hours)  Urine Culture     Status: Abnormal   Collection Time: 02/11/24  1:18 PM   Specimen: Urine, Catheterized  Result Value Ref Range Status   Specimen Description   Final    URINE, CATHETERIZED Performed at [facility], [address] [place], [place] [address]    Special Requests   Final    NONE Performed at [facility], [address] [place], [place] [address]    Culture (A)  Final    >=100,000 COLONIES/mL ESCHERICHIA COLI 90,000 COLONIES/mL KLEBSIELLA PNEUMONIAE    Report Status 02/15/2024 FINAL  Final   Organism ID, Bacteria ESCHERICHIA COLI (A)  Final   Organism ID, Bacteria KLEBSIELLA PNEUMONIAE (A)  Final      Susceptibility   Escherichia coli - MIC*    AMPICILLIN <=2 SENSITIVE Sensitive     CEFAZOLIN <=4 SENSITIVE Sensitive     CEFEPIME <=0.12 SENSITIVE Sensitive     CEFTRIAXONE <=0.25 SENSITIVE Sensitive     CIPROFLOXACIN <=0.25 SENSITIVE Sensitive      GENTAMICIN <=1 SENSITIVE Sensitive     IMIPENEM <=0.25 SENSITIVE Sensitive     NITROFURANTOIN <=16 SENSITIVE Sensitive  TRIMETH/SULFA <=20 SENSITIVE Sensitive     AMPICILLIN/SULBACTAM <=2 SENSITIVE Sensitive     PIP/TAZO <=4 SENSITIVE Sensitive ug/mL    * >=100,000 COLONIES/mL ESCHERICHIA COLI   Klebsiella pneumoniae - MIC*    AMPICILLIN RESISTANT Resistant     CEFAZOLIN <=4 SENSITIVE Sensitive     CEFEPIME <=0.12 SENSITIVE Sensitive     CEFTRIAXONE <=0.25 SENSITIVE Sensitive     CIPROFLOXACIN <=0.25 SENSITIVE Sensitive     GENTAMICIN <=1 SENSITIVE Sensitive     IMIPENEM <=0.25 SENSITIVE Sensitive     NITROFURANTOIN 32 SENSITIVE Sensitive     TRIMETH/SULFA <=20 SENSITIVE Sensitive     AMPICILLIN/SULBACTAM <=2 SENSITIVE Sensitive     PIP/TAZO <=4 SENSITIVE Sensitive ug/mL    * 90,000 COLONIES/mL KLEBSIELLA PNEUMONIAE  Resp panel by RT-PCR (RSV, Flu A&B, Covid) Anterior Nasal Swab     Status: None   Collection Time: 02/11/24  3:05 PM   Specimen: Anterior Nasal Swab  Result Value Ref Range Status   SARS Coronavirus 2 by RT PCR NEGATIVE NEGATIVE Final    Comment: (NOTE) SARS-CoV-2 target nucleic acids are NOT DETECTED.  The SARS-CoV-2 RNA is generally detectable in upper respiratory specimens during the acute phase of infection. The lowest concentration of SARS-CoV-2 viral copies this assay can detect is 138 copies/mL. A negative result does not preclude SARS-Cov-2 infection and should not be used as the sole basis for treatment or other patient management decisions. A negative result may occur with  improper specimen collection/handling, submission of specimen other than nasopharyngeal swab, presence of viral mutation(s) within the areas targeted by this assay, and inadequate number of viral copies(<138 copies/mL). A negative result must be combined with clinical observations, patient history, and epidemiological information. The expected result is Negative.  Fact Sheet for  Patients:  BloggerCourse.com  Fact Sheet for Healthcare Providers:  SeriousBroker.it  This test is no t yet approved or cleared by the [place] FDA and  has been authorized for detection and/or diagnosis of SARS-CoV-2 by FDA under an Emergency Use Authorization (EUA). This EUA will remain  in effect (meaning this test can be used) for the duration of the COVID-19 declaration under Section 564(b)(1) of the Act, 21 U.S.C.section 360bbb-3(b)(1), unless the authorization is terminated  or revoked sooner.       Influenza A by PCR NEGATIVE NEGATIVE Final   Influenza B by PCR NEGATIVE NEGATIVE Final    Comment: (NOTE) The Xpert Xpress SARS-CoV-2/FLU/RSV plus assay is intended as an aid in the diagnosis of influenza from Nasopharyngeal swab specimens and should not be used as a sole basis for treatment. Nasal washings and aspirates are unacceptable for Xpert Xpress SARS-CoV-2/FLU/RSV testing.  Fact Sheet for Patients: BloggerCourse.com  Fact Sheet for Healthcare Providers: SeriousBroker.it  This test is not yet approved or cleared by the [place] FDA and has been authorized for detection and/or diagnosis of SARS-CoV-2 by FDA under an Emergency Use Authorization (EUA). This EUA will remain in effect (meaning this test can be used) for the duration of the COVID-19 declaration under Section 564(b)(1) of the Act, 21 U.S.C. section 360bbb-3(b)(1), unless the authorization is terminated or revoked.     Resp Syncytial Virus by PCR NEGATIVE NEGATIVE Final    Comment: (NOTE) Fact Sheet for Patients: BloggerCourse.com  Fact Sheet for Healthcare Providers: SeriousBroker.it  This test is not yet approved or cleared by the [place] FDA and has been authorized for detection and/or diagnosis of SARS-CoV-2 by FDA under an Emergency  Use Authorization (EUA).  This EUA will remain in effect (meaning this test can be used) for the duration of the COVID-19 declaration under Section 564(b)(1) of the Act, 21 U.S.C. section 360bbb-3(b)(1), unless the authorization is terminated or revoked.  Performed at [facility], [address] [place], [place] [address]   Blood culture (routine x 2)     Status: None   Collection Time: 02/11/24  3:32 PM   Specimen: BLOOD  Result Value Ref Range Status   Specimen Description BLOOD BLOOD RIGHT ARM  Final   Special Requests   Final    BOTTLES DRAWN AEROBIC AND ANAEROBIC Blood Culture adequate volume   Culture   Final    NO GROWTH 5 DAYS Performed at [facility], [address] [place], [place] [address]    Report Status 02/16/2024 FINAL  Final  Blood culture (routine x 2)     Status: Abnormal (Preliminary result)   Collection Time: 02/11/24  3:35 PM   Specimen: BLOOD  Result Value Ref Range Status   Specimen Description   Final    BLOOD RIGHT ANTECUBITAL Performed at [facility], [address], [place], [place] [address]    Special Requests   Final    BOTTLES DRAWN AEROBIC AND ANAEROBIC Blood Culture results may not be optimal due to an inadequate volume of blood received in culture bottles Performed at [facility], [address] [place], [place] [address]    Culture  Setup Time   Final    Organism ID to follow GRAM NEGATIVE RODS GRAM POSITIVE RODS AEROBIC BOTTLE ONLY CRITICAL RESULT CALLED TO, READ BACK BY AND VERIFIED WITH: NATHAN BELEUE @2352  ON 02/13/24 SKL Performed at [facility], [address], [place], [place] [address]    Culture (A)  Final    ESCHERICHIA COLI GRAM POSITIVE RODS CULTURE REINCUBATED FOR BETTER GROWTH SUSCEPTIBILITIES TO FOLLOW FOR ESCHERICHIA COLI Performed at [facility], [address] [place], [place] [address]    Report Status PENDING  Incomplete  Blood Culture ID Panel  (Reflexed)     Status: Abnormal   Collection Time: 02/11/24  3:35 PM  Result Value Ref Range Status   Enterococcus faecalis NOT DETECTED NOT DETECTED Final   Enterococcus Faecium NOT DETECTED NOT DETECTED Final   Listeria monocytogenes NOT DETECTED NOT DETECTED Final   Staphylococcus species NOT DETECTED NOT DETECTED Final   Staphylococcus aureus (BCID) NOT DETECTED NOT DETECTED Final   Staphylococcus epidermidis NOT DETECTED NOT DETECTED Final   Staphylococcus lugdunensis NOT DETECTED NOT DETECTED Final   Streptococcus species NOT DETECTED NOT DETECTED Final   Streptococcus agalactiae NOT DETECTED NOT DETECTED Final   Streptococcus pneumoniae NOT DETECTED NOT DETECTED Final   Streptococcus pyogenes NOT DETECTED NOT DETECTED Final   A.calcoaceticus-baumannii NOT DETECTED NOT DETECTED Final   Bacteroides fragilis NOT DETECTED NOT DETECTED Final   Enterobacterales DETECTED (A) NOT DETECTED Final    Comment: Enterobacterales represent a large order of gram negative bacteria, not a single organism. CRITICAL RESULT CALLED TO, READ BACK BY AND VERIFIED WITH: NATHAN BELEUE @2352  ON 02/13/24 SKL    Enterobacter cloacae complex NOT DETECTED NOT DETECTED Final   Escherichia coli DETECTED (A) NOT DETECTED Final    Comment: CRITICAL RESULT CALLED TO, READ BACK BY AND VERIFIED WITH: NATHAN BELEUE @2352  ON 02/13/24 SKL    Klebsiella aerogenes NOT DETECTED NOT DETECTED Final   Klebsiella oxytoca NOT DETECTED NOT DETECTED Final   Klebsiella pneumoniae NOT DETECTED NOT DETECTED Final  Proteus species NOT DETECTED NOT DETECTED Final   Salmonella species NOT DETECTED NOT DETECTED Final   Serratia marcescens NOT DETECTED NOT DETECTED Final   Haemophilus influenzae NOT DETECTED NOT DETECTED Final   Neisseria meningitidis NOT DETECTED NOT DETECTED Final   Pseudomonas aeruginosa NOT DETECTED NOT DETECTED Final   Stenotrophomonas maltophilia NOT DETECTED NOT DETECTED Final   Candida albicans NOT DETECTED  NOT DETECTED Final   Candida auris NOT DETECTED NOT DETECTED Final   Candida glabrata NOT DETECTED NOT DETECTED Final   Candida krusei NOT DETECTED NOT DETECTED Final   Candida parapsilosis NOT DETECTED NOT DETECTED Final   Candida tropicalis NOT DETECTED NOT DETECTED Final   Cryptococcus neoformans/gattii NOT DETECTED NOT DETECTED Final   CTX-M ESBL NOT DETECTED NOT DETECTED Final   Carbapenem resistance IMP NOT DETECTED NOT DETECTED Final   Carbapenem resistance KPC NOT DETECTED NOT DETECTED Final   Carbapenem resistance NDM NOT DETECTED NOT DETECTED Final   Carbapenem resist OXA 48 LIKE NOT DETECTED NOT DETECTED Final   Carbapenem resistance VIM NOT DETECTED NOT DETECTED Final    Comment: Performed at [facility], [address] [place], [place] [address]  Culture, blood (Routine X 2) w Reflex to ID Panel     Status: None (Preliminary result)   Collection Time: 02/15/24  1:24 PM   Specimen: BLOOD  Result Value Ref Range Status   Specimen Description BLOOD RAC  Final   Special Requests   Final    BOTTLES DRAWN AEROBIC AND ANAEROBIC Blood Culture adequate volume   Culture   Final    NO GROWTH < 24 HOURS Performed at [facility], [address] [place], [place] [address]    Report Status PENDING  Incomplete  Culture, blood (Routine X 2) w Reflex to ID Panel     Status: None (Preliminary result)   Collection Time: 02/15/24  1:25 PM   Specimen: BLOOD  Result Value Ref Range Status   Specimen Description BLOOD BLOOD LEFT ARM  Final   Special Requests   Final    BOTTLES DRAWN AEROBIC AND ANAEROBIC Blood Culture adequate volume   Culture   Final    NO GROWTH < 24 HOURS Performed at [facility], [address] [place], [place] [address]    Report Status PENDING  Incomplete     Labs: BNP (last 3 results) No results for input(s): "BNP" in the last 8760 hours. Basic Metabolic Panel: Recent Labs  Lab 02/12/24 0134 02/13/24 0447 02/14/24 0336  02/15/24 0455 02/16/24 0222  NA 134* 137 138 138 137  K 3.5 3.5 3.2* 3.7 3.6  CL 101 101 104 104 104  CO2 23 26 26 27 25   GLUCOSE 118* 104* 91 94 98  BUN 19 16 20 18 22   CREATININE 0.95 0.91 0.83 0.81 0.78  CALCIUM 8.5* 8.5* 8.3* 8.6* 8.5*   Liver Function Tests: Recent Labs  Lab 02/12/24 0134  AST 27  ALT 15  ALKPHOS 104  BILITOT 0.8  PROT 6.8  ALBUMIN 2.8*   No results for input(s): "LIPASE", "AMYLASE" in the last 168 hours. No results for input(s): "AMMONIA" in the last 168 hours. CBC: Recent Labs  Lab 02/12/24 0134 02/13/24 0447 02/14/24 0336 02/15/24 0455 02/16/24 0222  WBC 13.6* 10.3 8.7 9.7 9.7  HGB 13.2 10.7* 10.6* 10.7* 10.6*  HCT 40.4 33.3* 31.9* 31.9* 32.3*  MCV 94.6 94.1 90.9 90.1 91.8  PLT 224 201 224 261 281   Cardiac Enzymes: No  results for input(s): "CKTOTAL", "CKMB", "CKMBINDEX", "TROPONINI" in the last 168 hours. BNP: Invalid input(s): "POCBNP" CBG: Recent Labs  Lab 02/11/24 1316  GLUCAP 101*   D-Dimer No results for input(s): "DDIMER" in the last 72 hours. Hgb A1c No results for input(s): "HGBA1C" in the last 72 hours. Lipid Profile No results for input(s): "CHOL", "HDL", "LDLCALC", "TRIG", "CHOLHDL", "LDLDIRECT" in the last 72 hours. Thyroid function studies No results for input(s): "TSH", "T4TOTAL", "T3FREE", "THYROIDAB" in the last 72 hours.  Invalid input(s): "FREET3" Anemia work up No results for input(s): "VITAMINB12", "FOLATE", "FERRITIN", "TIBC", "IRON", "RETICCTPCT" in the last 72 hours. Urinalysis    Component Value Date/Time   COLORURINE YELLOW (A) 02/11/2024 1318   APPEARANCEUR CLOUDY (A) 02/11/2024 1318   LABSPEC 1.012 02/11/2024 1318   PHURINE 5.0 02/11/2024 1318   GLUCOSEU NEGATIVE 02/11/2024 1318   HGBUR MODERATE (A) 02/11/2024 1318   BILIRUBINUR NEGATIVE 02/11/2024 1318   KETONESUR NEGATIVE 02/11/2024 1318   PROTEINUR 100 (A) 02/11/2024 1318   NITRITE POSITIVE (A) 02/11/2024 1318   LEUKOCYTESUR LARGE (A)  02/11/2024 1318   Sepsis Labs Recent Labs  Lab 02/13/24 0447 02/14/24 0336 02/15/24 0455 02/16/24 0222  WBC 10.3 8.7 9.7 9.7   Microbiology Recent Results (from the past 240 hours)  Urine Culture     Status: Abnormal   Collection Time: 02/11/24  1:18 PM   Specimen: Urine, Catheterized  Result Value Ref Range Status   Specimen Description   Final    URINE, CATHETERIZED Performed at [facility], [address] [place], [place] [address]    Special Requests   Final    NONE Performed at [facility], [address], [place], [place] [address]    Culture (A)  Final    >=100,000 COLONIES/mL ESCHERICHIA COLI 90,000 COLONIES/mL KLEBSIELLA PNEUMONIAE    Report Status 02/15/2024 FINAL  Final   Organism ID, Bacteria ESCHERICHIA COLI (A)  Final   Organism ID, Bacteria KLEBSIELLA PNEUMONIAE (A)  Final      Susceptibility   Escherichia coli - MIC*    AMPICILLIN <=2 SENSITIVE Sensitive     CEFAZOLIN <=4 SENSITIVE Sensitive     CEFEPIME <=0.12 SENSITIVE Sensitive     CEFTRIAXONE <=0.25 SENSITIVE Sensitive     CIPROFLOXACIN <=0.25 SENSITIVE Sensitive     GENTAMICIN <=1 SENSITIVE Sensitive     IMIPENEM <=0.25 SENSITIVE Sensitive     NITROFURANTOIN <=16 SENSITIVE Sensitive     TRIMETH/SULFA <=20 SENSITIVE Sensitive     AMPICILLIN/SULBACTAM <=2 SENSITIVE Sensitive     PIP/TAZO <=4 SENSITIVE Sensitive ug/mL    * >=100,000 COLONIES/mL ESCHERICHIA COLI   Klebsiella pneumoniae - MIC*    AMPICILLIN RESISTANT Resistant     CEFAZOLIN <=4 SENSITIVE Sensitive     CEFEPIME <=0.12 SENSITIVE Sensitive     CEFTRIAXONE <=0.25 SENSITIVE Sensitive     CIPROFLOXACIN <=0.25 SENSITIVE Sensitive     GENTAMICIN <=1 SENSITIVE Sensitive     IMIPENEM <=0.25 SENSITIVE Sensitive     NITROFURANTOIN 32 SENSITIVE Sensitive     TRIMETH/SULFA <=20 SENSITIVE Sensitive     AMPICILLIN/SULBACTAM <=2 SENSITIVE Sensitive     PIP/TAZO <=4 SENSITIVE Sensitive ug/mL    * 90,000 COLONIES/mL  KLEBSIELLA PNEUMONIAE  Resp panel by RT-PCR (RSV, Flu A&B, Covid) Anterior Nasal Swab     Status: None   Collection Time: 02/11/24  3:05 PM   Specimen: Anterior Nasal Swab  Result Value Ref Range Status   SARS Coronavirus 2 by RT PCR NEGATIVE NEGATIVE Final  Comment: (NOTE) SARS-CoV-2 target nucleic acids are NOT DETECTED.  The SARS-CoV-2 RNA is generally detectable in upper respiratory specimens during the acute phase of infection. The lowest concentration of SARS-CoV-2 viral copies this assay can detect is 138 copies/mL. A negative result does not preclude SARS-Cov-2 infection and should not be used as the sole basis for treatment or other patient management decisions. A negative result may occur with  improper specimen collection/handling, submission of specimen other than nasopharyngeal swab, presence of viral mutation(s) within the areas targeted by this assay, and inadequate number of viral copies(<138 copies/mL). A negative result must be combined with clinical observations, patient history, and epidemiological information. The expected result is Negative.  Fact Sheet for Patients:  BloggerCourse.com  Fact Sheet for Healthcare Providers:  SeriousBroker.it  This test is no t yet approved or cleared by the [place] FDA and  has been authorized for detection and/or diagnosis of SARS-CoV-2 by FDA under an Emergency Use Authorization (EUA). This EUA will remain  in effect (meaning this test can be used) for the duration of the COVID-19 declaration under Section 564(b)(1) of the Act, 21 U.S.C.section 360bbb-3(b)(1), unless the authorization is terminated  or revoked sooner.       Influenza A by PCR NEGATIVE NEGATIVE Final   Influenza B by PCR NEGATIVE NEGATIVE Final    Comment: (NOTE) The Xpert Xpress SARS-CoV-2/FLU/RSV plus assay is intended as an aid in the diagnosis of influenza from Nasopharyngeal swab specimens  and should not be used as a sole basis for treatment. Nasal washings and aspirates are unacceptable for Xpert Xpress SARS-CoV-2/FLU/RSV testing.  Fact Sheet for Patients: BloggerCourse.com  Fact Sheet for Healthcare Providers: SeriousBroker.it  This test is not yet approved or cleared by the [place] FDA and has been authorized for detection and/or diagnosis of SARS-CoV-2 by FDA under an Emergency Use Authorization (EUA). This EUA will remain in effect (meaning this test can be used) for the duration of the COVID-19 declaration under Section 564(b)(1) of the Act, 21 U.S.C. section 360bbb-3(b)(1), unless the authorization is terminated or revoked.     Resp Syncytial Virus by PCR NEGATIVE NEGATIVE Final    Comment: (NOTE) Fact Sheet for Patients: BloggerCourse.com  Fact Sheet for Healthcare Providers: SeriousBroker.it  This test is not yet approved or cleared by the [place] FDA and has been authorized for detection and/or diagnosis of SARS-CoV-2 by FDA under an Emergency Use Authorization (EUA). This EUA will remain in effect (meaning this test can be used) for the duration of the COVID-19 declaration under Section 564(b)(1) of the Act, 21 U.S.C. section 360bbb-3(b)(1), unless the authorization is terminated or revoked.  Performed at [facility], [address] [place], [place] [address]   Blood culture (routine x 2)     Status: None   Collection Time: 02/11/24  3:32 PM   Specimen: BLOOD  Result Value Ref Range Status   Specimen Description BLOOD BLOOD RIGHT ARM  Final   Special Requests   Final    BOTTLES DRAWN AEROBIC AND ANAEROBIC Blood Culture adequate volume   Culture   Final    NO GROWTH 5 DAYS Performed at [facility], [address], [place], [place] [address]    Report Status 02/16/2024 FINAL  Final  Blood culture (routine x 2)      Status: Abnormal (Preliminary result)   Collection Time: 02/11/24  3:35 PM   Specimen: BLOOD  Result Value Ref Range Status   Specimen Description   Final  BLOOD RIGHT ANTECUBITAL Performed at [facility], [address] [place], [place] [address]    Special Requests   Final    BOTTLES DRAWN AEROBIC AND ANAEROBIC Blood Culture results may not be optimal due to an inadequate volume of blood received in culture bottles Performed at [facility], [address] [place], [place] [address]    Culture  Setup Time   Final    Organism ID to follow GRAM NEGATIVE RODS GRAM POSITIVE RODS AEROBIC BOTTLE ONLY CRITICAL RESULT CALLED TO, READ BACK BY AND VERIFIED WITH: NATHAN BELEUE @2352  ON 02/13/24 SKL Performed at [facility], [address], [place], [place] [address]    Culture (A)  Final    ESCHERICHIA COLI GRAM POSITIVE RODS CULTURE REINCUBATED FOR BETTER GROWTH SUSCEPTIBILITIES TO FOLLOW FOR ESCHERICHIA COLI Performed at [facility], [address], [place], [place] [address]    Report Status PENDING  Incomplete  Blood Culture ID Panel (Reflexed)     Status: Abnormal   Collection Time: 02/11/24  3:35 PM  Result Value Ref Range Status   Enterococcus faecalis NOT DETECTED NOT DETECTED Final   Enterococcus Faecium NOT DETECTED NOT DETECTED Final   Listeria monocytogenes NOT DETECTED NOT DETECTED Final   Staphylococcus species NOT DETECTED NOT DETECTED Final   Staphylococcus aureus (BCID) NOT DETECTED NOT DETECTED Final   Staphylococcus epidermidis NOT DETECTED NOT DETECTED Final   Staphylococcus lugdunensis NOT DETECTED NOT DETECTED Final   Streptococcus species NOT DETECTED NOT DETECTED Final   Streptococcus agalactiae NOT DETECTED NOT DETECTED Final   Streptococcus pneumoniae NOT DETECTED NOT DETECTED Final   Streptococcus pyogenes NOT DETECTED NOT DETECTED Final   A.calcoaceticus-baumannii NOT DETECTED NOT DETECTED Final   Bacteroides  fragilis NOT DETECTED NOT DETECTED Final   Enterobacterales DETECTED (A) NOT DETECTED Final    Comment: Enterobacterales represent a large order of gram negative bacteria, not a single organism. CRITICAL RESULT CALLED TO, READ BACK BY AND VERIFIED WITH: NATHAN BELEUE @2352  ON 02/13/24 SKL    Enterobacter cloacae complex NOT DETECTED NOT DETECTED Final   Escherichia coli DETECTED (A) NOT DETECTED Final    Comment: CRITICAL RESULT CALLED TO, READ BACK BY AND VERIFIED WITH: NATHAN BELEUE @2352  ON 02/13/24 SKL    Klebsiella aerogenes NOT DETECTED NOT DETECTED Final   Klebsiella oxytoca NOT DETECTED NOT DETECTED Final   Klebsiella pneumoniae NOT DETECTED NOT DETECTED Final   Proteus species NOT DETECTED NOT DETECTED Final   Salmonella species NOT DETECTED NOT DETECTED Final   Serratia marcescens NOT DETECTED NOT DETECTED Final   Haemophilus influenzae NOT DETECTED NOT DETECTED Final   Neisseria meningitidis NOT DETECTED NOT DETECTED Final   Pseudomonas aeruginosa NOT DETECTED NOT DETECTED Final   Stenotrophomonas maltophilia NOT DETECTED NOT DETECTED Final   Candida albicans NOT DETECTED NOT DETECTED Final   Candida auris NOT DETECTED NOT DETECTED Final   Candida glabrata NOT DETECTED NOT DETECTED Final   Candida krusei NOT DETECTED NOT DETECTED Final   Candida parapsilosis NOT DETECTED NOT DETECTED Final   Candida tropicalis NOT DETECTED NOT DETECTED Final   Cryptococcus neoformans/gattii NOT DETECTED NOT DETECTED Final   CTX-M ESBL NOT DETECTED NOT DETECTED Final   Carbapenem resistance IMP NOT DETECTED NOT DETECTED Final   Carbapenem resistance KPC NOT DETECTED NOT DETECTED Final   Carbapenem resistance NDM NOT DETECTED NOT DETECTED Final   Carbapenem resist OXA 48 LIKE NOT DETECTED NOT DETECTED Final   Carbapenem resistance VIM NOT DETECTED NOT  DETECTED Final    Comment: Performed at [facility], [address] [place], [place] [address]  Culture, blood (Routine X 2) w  Reflex to ID Panel     Status: None (Preliminary result)   Collection Time: 02/15/24  1:24 PM   Specimen: BLOOD  Result Value Ref Range Status   Specimen Description BLOOD RAC  Final   Special Requests   Final    BOTTLES DRAWN AEROBIC AND ANAEROBIC Blood Culture adequate volume   Culture   Final    NO GROWTH < 24 HOURS Performed at [facility], [address] [place], [place] [address]    Report Status PENDING  Incomplete  Culture, blood (Routine X 2) w Reflex to ID Panel     Status: None (Preliminary result)   Collection Time: 02/15/24  1:25 PM   Specimen: BLOOD  Result Value Ref Range Status   Specimen Description BLOOD BLOOD LEFT ARM  Final   Special Requests   Final    BOTTLES DRAWN AEROBIC AND ANAEROBIC Blood Culture adequate volume   Culture   Final    NO GROWTH < 24 HOURS Performed at [facility], [address] [place], [place] [address]    Report Status PENDING  Incomplete     Time coordinating discharge: Over 30 minutes  SIGNED:   Charise Killian, MD  [facility] 02/16/2024, 8:17 AM Pager   If 7PM-7AM, please contact night-coverage [email or web address]

## 2024-02-16 NOTE — TOC Transition Note (Signed)
 Transition of Care [facility]) - Discharge Note   Patient Details  Name: Lauren Hammond MRN: [id number] Date of Birth: 1932-01-15  Transition of Care [facility]) CM/SW Contact:  Bing Quarry, RN Phone Number: 02/16/2024, 9:54 AM   Clinical Narrative: 3/23: Patient has discharge orders in to home with home health via [facility] for PT/OT. Verified that orders are in chart.  RW via [facility] ordered by prior CM on 01/26/23. Has a standard walker at home per prior CM notes after speaking with daughter, Lamar Laundry.    Gabriel Cirri MSN RN CM  RN Case Manager [place]  Transitions of Care Direct Dial: [phone] (Weekends Only) [facility] Main Office Phone: [phone] [facility] Fax: [phone] [email or web address]      Final next level of care: Home w Home Health Services Barriers to Discharge: Barriers Resolved   Patient Goals and CMS Choice            Discharge Placement                       Discharge Plan and Services Additional resources added to the After Visit Summary for                  DME Arranged: Walker rolling DME Agency: [facility] Date DME Agency Contacted: 02/13/24     HH Arranged: PT, OT HH Agency: [facility]        Social Drivers of Health (SDOH) Interventions SDOH Screenings   Food Insecurity: No Food Insecurity (02/11/2024)  Housing: Unknown (02/11/2024)  Transportation Needs: No Transportation Needs (02/11/2024)  Utilities: Not At Risk (02/11/2024)  Financial Resource Strain: Low Risk  (01/15/2024)   Received from [facility]  Social Connections: Unknown (02/11/2024)  Tobacco Use: Low Risk  (02/11/2024)  Health Literacy: Medium Risk (01/31/2024)   Received from [facility]     Readmission Risk Interventions     No data to display

## 2024-02-16 NOTE — Plan of Care (Signed)

## 2024-02-16 NOTE — Progress Notes (Signed)
 Mobility Specialist - Progress Note     02/16/24 0900  Mobility  Activity Ambulated with assistance in hallway;Stood at bedside  Level of Assistance Standby assist, set-up cues, supervision of patient - no hands on  Assistive Device Front wheel walker  Distance Ambulated (ft) 200 ft  Range of Motion/Exercises Active  LUE Weight Bearing Per Provider Order WBAT  RLE Weight Bearing Per Provider Order WBAT  LLE Weight Bearing Per Provider Order WBAT  Activity Response Tolerated well  Mobility Referral Yes  Mobility visit 1 Mobility  Mobility Specialist Start Time (ACUTE ONLY) [id number]  Mobility Specialist Stop Time (ACUTE ONLY) 0856  Mobility Specialist Time Calculation (min) (ACUTE ONLY) 18 min   Pt resting in recliner on RA upon entry. Pt STS and ambulates to hallway around NS SBA with RW. Pt endorses no pain or needs during session. Pt returned to recliner and left with needs in reach and chair alarm activated.   Johnathan Hausen Mobility Specialist 02/16/24, 9:03 AM

## 2024-02-17 LAB — CULTURE, BLOOD (ROUTINE X 2)

## 2024-02-20 LAB — CULTURE, BLOOD (ROUTINE X 2)
Culture: NO GROWTH
Culture: NO GROWTH
Special Requests: ADEQUATE
Special Requests: ADEQUATE

## 2024-03-04 ENCOUNTER — Encounter: Payer: Self-pay | Admitting: Urology

## 2024-03-04 ENCOUNTER — Ambulatory Visit: Admitting: Urology

## 2024-03-04 DIAGNOSIS — R339 Retention of urine, unspecified: Secondary | ICD-10-CM

## 2024-03-04 DIAGNOSIS — Z466 Encounter for fitting and adjustment of urinary device: Secondary | ICD-10-CM

## 2024-03-04 LAB — BLADDER SCAN AMB NON-IMAGING: Scan Result: 158

## 2024-03-04 MED ORDER — CEPHALEXIN 250 MG PO CAPS
500.0000 mg | ORAL_CAPSULE | Freq: Once | ORAL | Status: AC
  Administered 2024-03-04: 500 mg via ORAL

## 2024-03-04 MED ORDER — CEPHALEXIN 500 MG PO CAPS
500.0000 mg | ORAL_CAPSULE | Freq: Once | ORAL | Status: AC
  Administered 2024-03-04: 500 mg via ORAL

## 2024-03-04 NOTE — Progress Notes (Signed)
 03/04/24 9:30 AM   Lauren Hammond 10-Feb-1932 [id number]  CC: Urinary retention, pelvic fracture, pelvic hematoma  HPI: 89 year old female who was hospitalized at [facility] 01/20/2024 after a fall with CT showing left inferior pubic ramus fracture, left sacral fracture, and pelvic hematoma.  This was managed nonoperatively per orthopedics.  She developed urinary retention with reported elevated PVRs and a catheter was placed.  She apparently failed a voiding trial on 2/26, unclear what elevated PVR was.  She does not think she had adequate time to empty.  Prior to her fall, she had some baseline urinary frequency, but no incontinence, and nocturia 2 times per night.  Majority of history today is obtained from her daughter who is with her today.  Based on prior cross-sectional imaging in 2021 in 2017 may have a component of chronic incomplete bladder emptying with no hydronephrosis, incontinence, recurrent infections.  PMH: Past Medical History:  Diagnosis Date   Barrett esophagus    Chronic heart failure with preserved ejection fraction (HFpEF) (HCC)    a. 11/2015 Echo: EF 50-55%; b. 05/2019 Echo: EF 60-65%; c. 07/2022 Echo: EF 60-65%, no rwma, mild asymm LVH of basal-septal wall, nl RV fxn, RVSP 38.39mmHg. Mildly dil LA. Mild MR. Mild-mod TR.   Depression    Generalized OA    HTN (hypertension)    Hyperlipemia    Hypothyroid    Insomnia    Lung cancer (HCC) 11/2015   treated with radiation   Permanent atrial fibrillation (HCC)    a. Dx 06/2022-->DCCV; b. 11/2022 recurrent Afib-->tikosyn-->DCCV; c. 11/2022 ERAF-->Zio 100% AF burden; d. CHA2DS2VASc = 5-->xarelto.   Personal history of radiation therapy 2017   lung Ca   Uterine cancer (HCC) 1981   uterus ca with a hysterectomy    Surgical History: Past Surgical History:  Procedure Laterality Date   ABDOMINAL HYSTERECTOMY  1984   [place], [place]   APPENDECTOMY     BREAST BIOPSY Right 1980's   neg   CARDIOVERSION N/A 09/20/2022    Procedure: CARDIOVERSION;  Surgeon: Antonieta Iba, MD;  Location: [facility] ORS;  Service: Cardiovascular;  Laterality: N/A;   CARDIOVERSION N/A 12/06/2022   Procedure: CARDIOVERSION;  Surgeon: Chrystie Nose, MD;  Location[facility] ENDOSCOPY;  Service: Cardiovascular;  Laterality: N/A;   KNEE ARTHROSCOPY Right 2000   [facility]   right breast bx      Family History: Family History  Problem Relation Age of Onset   Hypertension Other    Breast cancer Maternal Aunt    Breast cancer Sister 77    Social History:  reports that she has never smoked. She has never used smokeless tobacco. She reports that she does not drink alcohol and does not use drugs.  Physical Exam:   Constitutional:  Alert and oriented, No acute distress. Cardiovascular: No clubbing, cyanosis, or edema. Respiratory: Normal respiratory effort, no increased work of breathing. GI: Abdomen is soft, nontender, nondistended, no abdominal masses GU: Foley with yellow urine  Laboratory Data: Reviewed, normal renal function  Pertinent Imaging: I have personally viewed and interpreted the CT from [facility] showing left inferior pubic ramus fracture, pelvic hematoma with compression of the bladder.  I also reviewed a CT scan from November 2021 and PET scan from May 2017 that showed mildly distended bladder, no hydronephrosis.  Assessment & Plan:   89 year old female with recent fall in February 2025 with pelvic fracture and pelvic hematoma with reported urinary retention and elevated PVR requiring Foley catheter placement.  Her  Foley was removed this morning and she has been able to void 6 or 7 times today with no problems, feels she has returned to her baseline urination.  PVR this afternoon essentially normal at .  Based on prior imaging back in 2021 in 2017, likely has a component of chronic incomplete emptying.  Return precautions discussed RTC 8 weeks PVR, if doing well at that time can follow-up as needed  I  spent 50 total minutes on the day of the encounter including pre-visit review of the medical record, face-to-face time with the patient, and post visit ordering of labs/imaging/tests.  Legrand Rams, MD 03/04/2024  [facility] [address], [address] [place], [place] [address] [phone]

## 2024-03-04 NOTE — Progress Notes (Signed)
 Catheter Removal  Patient is present today for a catheter removal. 8ml of water was drained from the balloon. A 14FR foley cath was removed from the bladder, no complications were noted. Patient tolerated well. Patient given one time dose of Keflex 500mg  per verbal order from Dr. Richardo Hanks.   Performed by: Debbe Bales, CMA (AAMA)   Follow up/ Additional notes: RTC this afternoon for PVR

## 2024-03-23 IMAGING — MG MM DIGITAL SCREENING BILAT W/ TOMO AND CAD
8 series · 8 of 24 positions shown · non-contrast
Comparison: Previous exam(s).

CLINICAL DATA: Screening.

EXAM:
DIGITAL SCREENING BILATERAL MAMMOGRAM WITH TOMOSYNTHESIS AND CAD
TECHNIQUE: Bilateral screening digital craniocaudal and mediolateral oblique
mammograms were obtained. Bilateral screening digital breast
tomosynthesis was performed. The images were evaluated with
computer-aided detection.

[R MLO synth-2D]
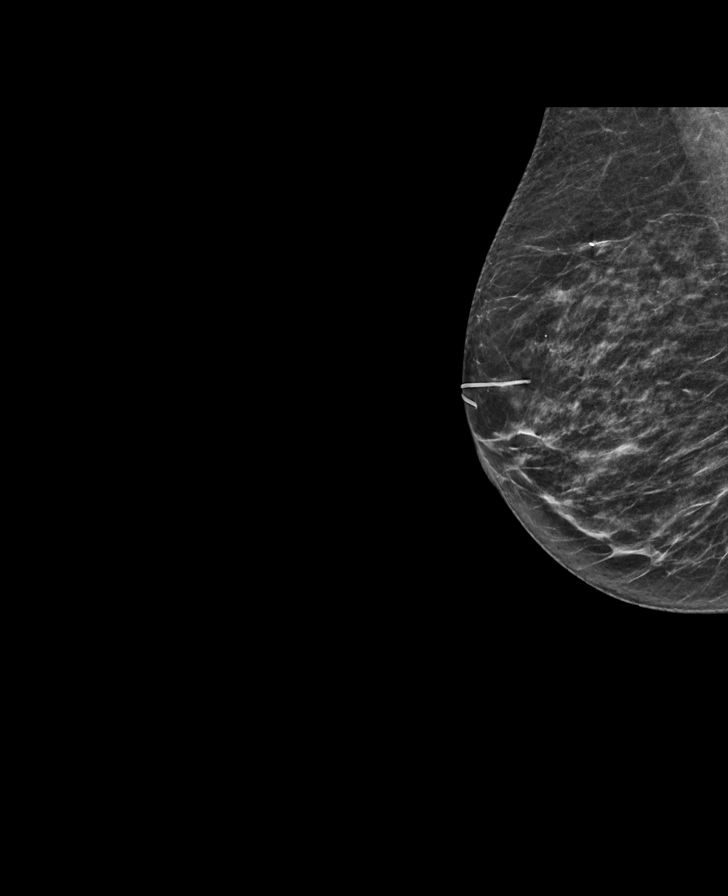

[R CC synth-2D]
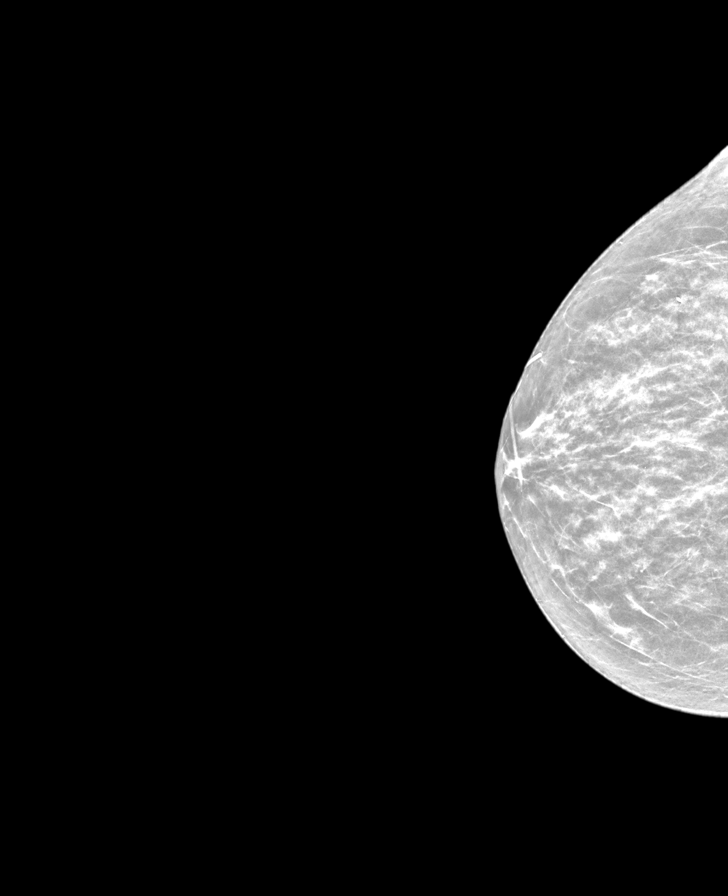

[L CC synth-2D]
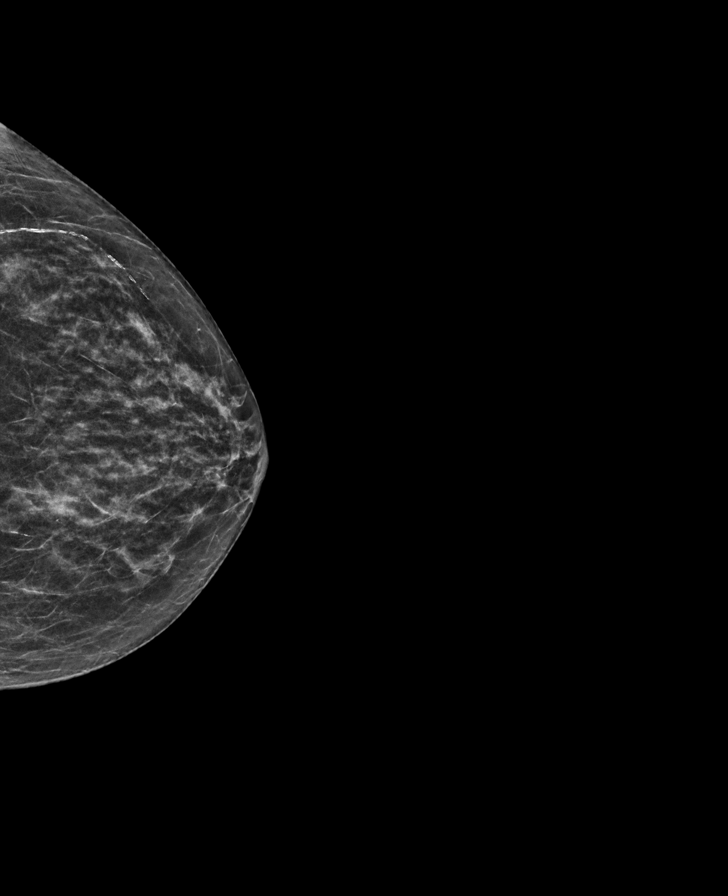

[L MLO synth-2D]
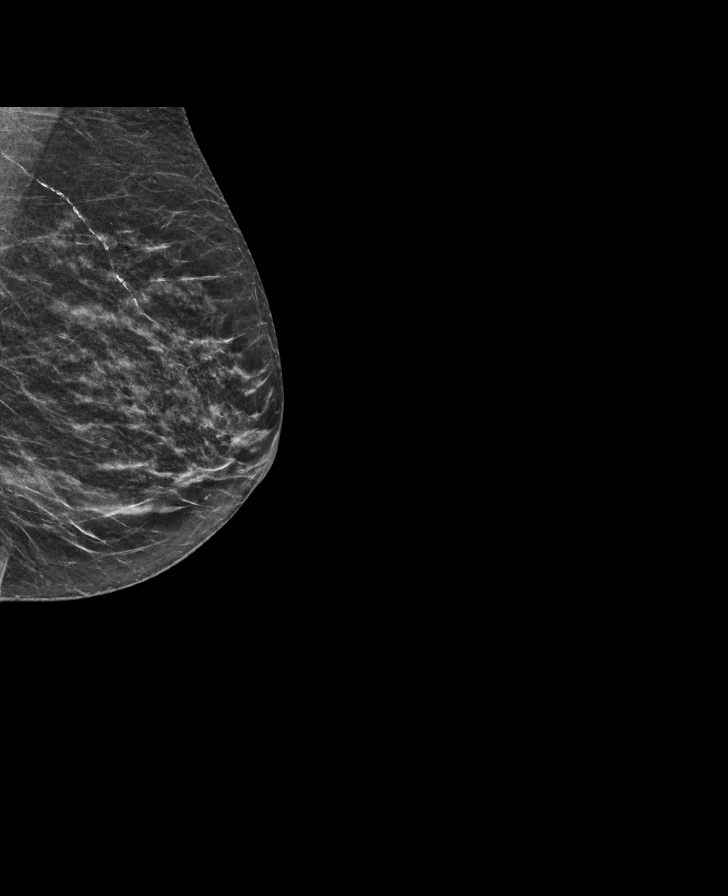

[R MLO tomo · tomo slice 25/50.0]
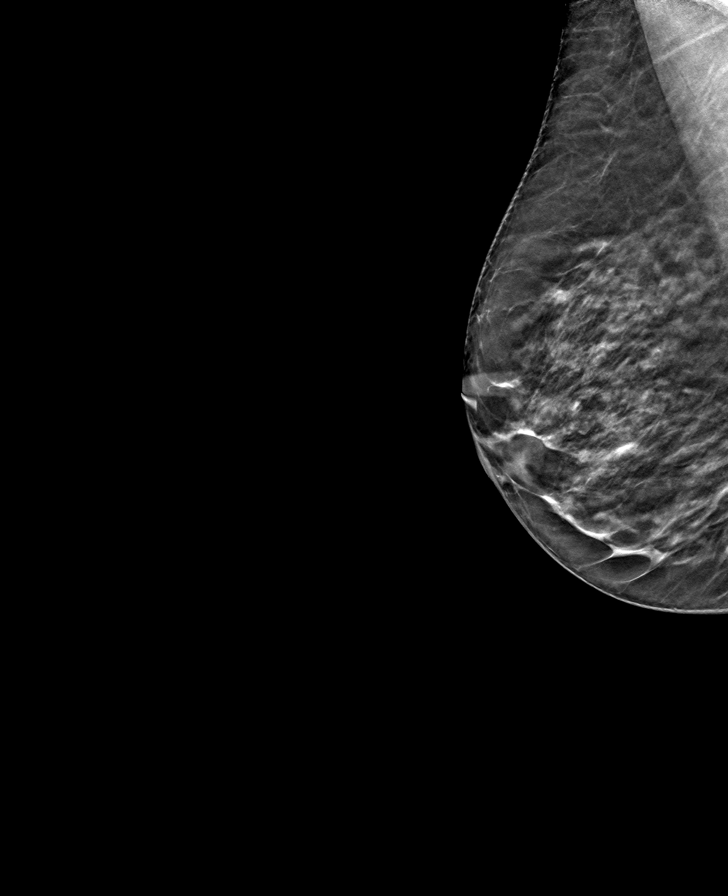

[L CC tomo · tomo slice 25/50.0]
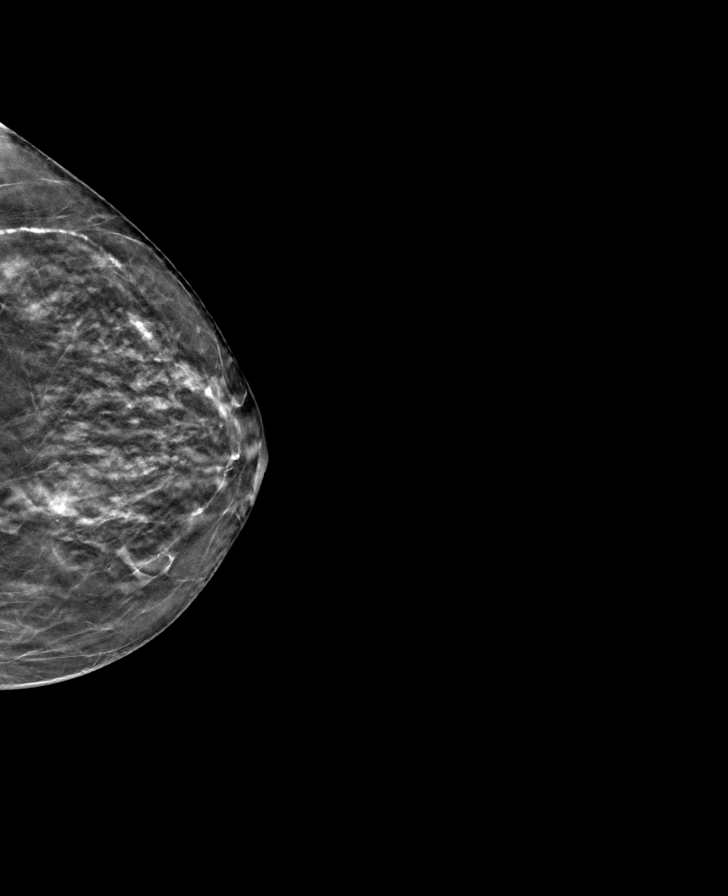

[L MLO tomo · tomo slice 26/51.0]
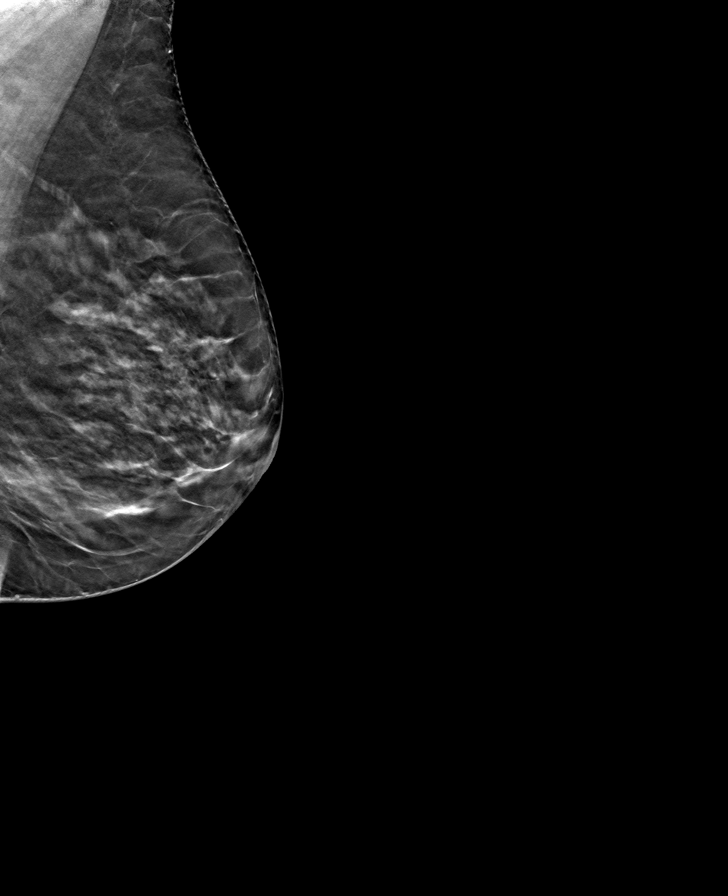

[R CC tomo · tomo slice 25/49.0]
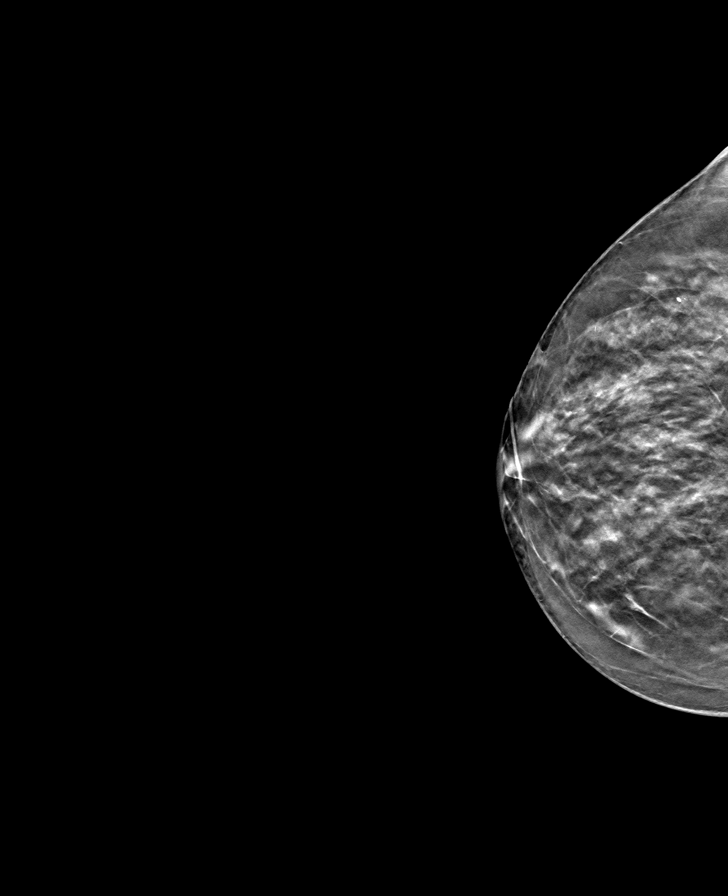

[8 of 24 positions shown; findings below may reference images not displayed]

ACR Breast Density Category b: There are scattered areas of
fibroglandular density.
FINDINGS: There are no findings suspicious for malignancy.
IMPRESSION: No mammographic evidence of malignancy. A result letter of this
screening mammogram will be mailed directly to the patient.

RECOMMENDATION:
Screening mammogram in one year. (Code:51-O-LD2)

BI-RADS CATEGORY  1: Negative.

## 2024-04-29 ENCOUNTER — Ambulatory Visit: Admitting: Urology

## 2024-07-04 ENCOUNTER — Other Ambulatory Visit: Payer: Self-pay | Admitting: Cardiovascular Disease

## 2024-07-06 NOTE — Telephone Encounter (Signed)
 Prescription refill request for Xarelto received.  Indication:afib Last office visit:12/24 Weight:65  kg Age:89 Scr:0.78  3/25 CrCl:49.19  ml/min  Prescription refilled

## 2024-09-30 ENCOUNTER — Other Ambulatory Visit: Payer: Self-pay | Admitting: Internal Medicine

## 2024-09-30 DIAGNOSIS — N63 Unspecified lump in unspecified breast: Secondary | ICD-10-CM

## 2024-10-02 ENCOUNTER — Ambulatory Visit
Admission: RE | Admit: 2024-10-02 | Discharge: 2024-10-02 | Disposition: A | Discharge status: Home / Self Care | Source: Ambulatory Visit | Attending: Internal Medicine | Admitting: Internal Medicine

## 2024-10-02 DIAGNOSIS — N644 Mastodynia: Secondary | ICD-10-CM | POA: Insufficient documentation

## 2024-10-02 DIAGNOSIS — N63 Unspecified lump in unspecified breast: Secondary | ICD-10-CM

## 2024-10-02 DIAGNOSIS — N6310 Unspecified lump in the right breast, unspecified quadrant: Secondary | ICD-10-CM | POA: Insufficient documentation

## 2025-01-12 ENCOUNTER — Ambulatory Visit: Admitting: Cardiovascular Disease
